# Patient Record
Sex: Male | Born: 1995 | Race: White | Hispanic: No | Marital: Single | Smoking: Current every day smoker
Health system: Southern US, Community
[De-identification: ages and names within clinical notes are randomized; demographics above are authoritative.]

## PROBLEM LIST (undated history)

## (undated) DIAGNOSIS — G8929 Other chronic pain: Secondary | ICD-10-CM

## (undated) DIAGNOSIS — M419 Scoliosis, unspecified: Secondary | ICD-10-CM

## (undated) DIAGNOSIS — F909 Attention-deficit hyperactivity disorder, unspecified type: Secondary | ICD-10-CM

## (undated) DIAGNOSIS — R569 Unspecified convulsions: Secondary | ICD-10-CM

## (undated) DIAGNOSIS — R000 Tachycardia, unspecified: Secondary | ICD-10-CM

## (undated) DIAGNOSIS — G4700 Insomnia, unspecified: Secondary | ICD-10-CM

## (undated) DIAGNOSIS — F4310 Post-traumatic stress disorder, unspecified: Secondary | ICD-10-CM

## (undated) DIAGNOSIS — J449 Chronic obstructive pulmonary disease, unspecified: Secondary | ICD-10-CM

## (undated) DIAGNOSIS — F419 Anxiety disorder, unspecified: Secondary | ICD-10-CM

## (undated) DIAGNOSIS — K089 Disorder of teeth and supporting structures, unspecified: Secondary | ICD-10-CM

## (undated) HISTORY — PX: NOSE SURGERY: SHX723

## (undated) HISTORY — PX: SKIN GRAFT: SHX250

## (undated) HISTORY — PX: CHOANAL ATRESIA REPAIR: SHX280

---
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
## 2014-07-18 ENCOUNTER — Emergency Department (HOSPITAL_COMMUNITY)
Admission: EM | Admit: 2014-07-18 | Discharge: 2014-07-18 | Disposition: A | Discharge status: Home / Self Care | Payer: Medicaid Other | Attending: Emergency Medicine | Admitting: Emergency Medicine

## 2014-07-18 ENCOUNTER — Encounter (HOSPITAL_COMMUNITY): Payer: Self-pay | Admitting: Emergency Medicine

## 2014-07-18 DIAGNOSIS — K002 Abnormalities of size and form of teeth: Secondary | ICD-10-CM | POA: Diagnosis not present

## 2014-07-18 DIAGNOSIS — M419 Scoliosis, unspecified: Secondary | ICD-10-CM | POA: Insufficient documentation

## 2014-07-18 DIAGNOSIS — R000 Tachycardia, unspecified: Secondary | ICD-10-CM | POA: Insufficient documentation

## 2014-07-18 DIAGNOSIS — K088 Other specified disorders of teeth and supporting structures: Secondary | ICD-10-CM | POA: Insufficient documentation

## 2014-07-18 DIAGNOSIS — Z79899 Other long term (current) drug therapy: Secondary | ICD-10-CM | POA: Diagnosis not present

## 2014-07-18 DIAGNOSIS — F419 Anxiety disorder, unspecified: Secondary | ICD-10-CM | POA: Diagnosis not present

## 2014-07-18 DIAGNOSIS — Z720 Tobacco use: Secondary | ICD-10-CM | POA: Diagnosis not present

## 2014-07-18 DIAGNOSIS — R220 Localized swelling, mass and lump, head: Secondary | ICD-10-CM | POA: Diagnosis present

## 2014-07-18 DIAGNOSIS — K0889 Other specified disorders of teeth and supporting structures: Secondary | ICD-10-CM

## 2014-07-18 MED ORDER — HYDROCODONE-ACETAMINOPHEN 5-325 MG PO TABS: 1.0000 | ORAL_TABLET | Freq: Four times a day (QID) | ORAL | Status: DC | PRN

## 2014-07-18 MED ORDER — HYDROMORPHONE HCL 2 MG/ML IJ SOLN
2.0000 mg | Freq: Once | INTRAMUSCULAR | Status: AC
  Administered 2014-07-18: 2 mg via INTRAMUSCULAR
  Filled 2014-07-18: qty 1

## 2014-07-18 NOTE — ED Notes (Signed)
Pt reports left sided dental pain x2 week. Pt reports pain became more intense last night. Mild swelling noted to left cheek. Airway patent. Pt reports saw pcp for same and px abx with minimal relief. Pt reports has appointment with oral surgeon within the next month. nad noted.

## 2014-07-18 NOTE — ED Provider Notes (Signed)
CSN: [id number]     Arrival date & time 07/18/14  7829 History   First MD Initiated Contact with Patient 07/18/14 1918     Chief Complaint  Patient presents with  . Oral Swelling     (Consider location/radiation/quality/duration/timing/severity/associated sxs/prior Treatment) The history is provided by the patient.   19 year old male. Here for complaint of tooth pain left upper tooth. Patient has some swelling to his face on that side. Patient said trouble with teeth before. This meant ongoing for 2 weeks without significant only worse here just last night. Patient has follow-up with oral surgery in the next few weeks. Patient actually has amoxicillin left over from prior toothache treatments. Just prescribed in December still current has at least a week's left. Patient started taking that along with clindamycin that he had left over 2 days ago.  Past Medical History  Diagnosis Date  . Scoliosis   . PTSD (post-traumatic stress disorder)   . Anxiety   . PTSD (post-traumatic stress disorder)   . Anxiety   . Seizures    Past Surgical History  Procedure Laterality Date  . Skin graft    . Nose surgery    . Choanal atresia repair     History reviewed. No pertinent family history. History  Substance Use Topics  . Smoking status: Current Every Day Smoker -- 1.00 packs/day  . Smokeless tobacco: Not on file  . Alcohol Use: No    Review of Systems  Constitutional: Negative for fever.  HENT: Positive for dental problem. Negative for congestion and trouble swallowing.   Eyes: Negative for visual disturbance.  Respiratory: Negative for shortness of breath.   Cardiovascular: Negative for chest pain.  Gastrointestinal: Negative for nausea, vomiting and abdominal pain.  Musculoskeletal: Negative for neck pain.  Skin: Negative for rash.  Neurological: Negative for headaches.  Hematological: Does not bruise/bleed easily.  Psychiatric/Behavioral: Negative for confusion.      Allergies   Review of patient's allergies indicates not on file.  Home Medications   Prior to Admission medications   Medication Sig Start Date End Date Taking? Authorizing Provider  albuterol (PROVENTIL HFA;VENTOLIN HFA) 108 (90 BASE) MCG/ACT inhaler Inhale 2 puffs into the lungs every 6 (six) hours as needed for wheezing or shortness of breath.   Yes Historical Provider, MD  ALPRAZolam Prudy Feeler) 1 MG tablet Take 0.5 mg by mouth 2 (two) times daily.   Yes Historical Provider, MD  amoxicillin (AMOXIL) 875 MG tablet Take 875 mg by mouth 2 (two) times daily.   Yes Historical Provider, MD  naproxen (NAPROSYN) 250 MG tablet Take 250 mg by mouth 2 (two) times daily as needed for mild pain.   Yes Historical Provider, MD  alprazolam Prudy Feeler) 2 MG tablet Take 2 mg by mouth 2 (two) times daily.     Historical Provider, MD  HYDROcodone-acetaminophen (NORCO/VICODIN) 5-325 MG per tablet Take 1-2 tablets by mouth every 6 (six) hours as needed. 07/18/14   Vanetta Mulders, MD   BP 123/71 mmHg  Pulse 130  Temp(Src) 98.2 F (36.8 C) (Oral)  Resp 18  Ht 6' (1.829 m)  Wt 150 lb (68.04 kg)  BMI 20.34 kg/m2  SpO2 100% Physical Exam  Constitutional: He is oriented to person, place, and time. He appears well-developed and well-nourished. No distress.  HENT:  Head: Normocephalic and atraumatic.  Mouth/Throat: Oropharynx is clear and moist.  Very poor dentition mild swelling to the left cheek area. No purulent discharge tenderness to the molars with marked cavities left  upper side. No swelling to the floor the mouth. No uvula shift.  Eyes: Conjunctivae and EOM are normal. Pupils are equal, round, and reactive to light.  Neck: Normal range of motion.  Cardiovascular: Regular rhythm and normal heart sounds.   No murmur heard. Tachycardic  Pulmonary/Chest: Effort normal and breath sounds normal. No respiratory distress.  Abdominal: Soft. There is no tenderness.  Musculoskeletal: Normal range of motion. He exhibits no  edema.  Neurological: He is alert and oriented to person, place, and time. No cranial nerve deficit. He exhibits normal muscle tone. Coordination normal.  Skin: Skin is warm. No rash noted.  Nursing note and vitals reviewed.   ED Course  Procedures (including critical care time) Labs Review Labs Reviewed - No data to display  Imaging Review No results found.   EKG Interpretation None      MDM   Final diagnoses:  Toothache    Patient denies extensive drug use but very poor dentition. Patient was some mild swelling to the left cheek area tenderness to the left upper molars. Patient already has antibiotic due to the pain situation in the past amoxicillin that was just prescribed in December. Has another 7 days of that in the bottle. We'll have him take that. Patient also has Naprosyn which she'll take for inflammation. And supplemented here with some hydrocodone for pain. According to patient's father is follow-up with dental or oral surgery in the next week. Patient here with persistent sinus tachycardia. Has a history of anxiety. Patient without any instability or problems from it did improve some with pain medicine provided here. Patient supposedly is been on Xanax in the past but spin out of it now for a week or longer. Patient does not appear overly anxious. They did not ask but Xanax prescription will not be provided.    Vanetta MuldersScott Berkeley Vanaken, MD 07/18/14 2107

## 2014-07-18 NOTE — ED Notes (Addendum)
Alert, NAD, temp rechecked, pt states he's only had 2 cups of pepsi today, also has been out of his 2mg  xanax for a week (took last one 1/24). Refusing IV at this time. Will wait for physician eval for further intervention

## 2014-07-18 NOTE — Discharge Instructions (Signed)
Dental Pain Toothache is pain in or around a tooth. It may get worse with chewing or with cold or heat.  HOME CARE  Your dentist may use a numbing medicine during treatment. If so, you may need to avoid eating until the medicine wears off. Ask your dentist about this.  Only take medicine as told by your dentist or doctor.  Avoid chewing food near the painful tooth until after all treatment is done. Ask your dentist about this. GET HELP RIGHT AWAY IF:   The problem gets worse or new problems appear.  You have a fever.  There is redness and puffiness (swelling) of the face, jaw, or neck.  You cannot open your mouth.  There is pain in the jaw.  There is very bad pain that is not helped by medicine. MAKE SURE YOU:   Understand these instructions.  Will watch your condition.  Will get help right away if you are not doing well or get worse. Document Released: 11/22/2007 Document Revised: 08/28/2011 Document Reviewed: 11/22/2007 [facility] Patient Information 2015 [employer]erraExitCare, MarylandLLC. This information is not intended to replace advice given to you by your health care provider. Make sure you discuss any questions you have with your health care provider.  Follow-up with a dentist or oral surgery as scheduled. As we discussed start taking the amoxicillin antibiotic that you have twice a day take it for 7 days. Also take the Naprosyn that you have as an anti-inflammatory medicine. Supplement with pain medicine as needed. Return for any new or worse symptoms.

## 2014-08-04 ENCOUNTER — Emergency Department (HOSPITAL_COMMUNITY)
Admission: EM | Admit: 2014-08-04 | Discharge: 2014-08-04 | Disposition: A | Discharge status: Home / Self Care | Payer: Medicaid Other | Attending: Emergency Medicine | Admitting: Emergency Medicine

## 2014-08-04 ENCOUNTER — Encounter (HOSPITAL_COMMUNITY): Payer: Self-pay | Admitting: Emergency Medicine

## 2014-08-04 DIAGNOSIS — K006 Disturbances in tooth eruption: Secondary | ICD-10-CM | POA: Insufficient documentation

## 2014-08-04 DIAGNOSIS — F4310 Post-traumatic stress disorder, unspecified: Secondary | ICD-10-CM | POA: Diagnosis not present

## 2014-08-04 DIAGNOSIS — K029 Dental caries, unspecified: Secondary | ICD-10-CM | POA: Diagnosis not present

## 2014-08-04 DIAGNOSIS — Z8669 Personal history of other diseases of the nervous system and sense organs: Secondary | ICD-10-CM | POA: Insufficient documentation

## 2014-08-04 DIAGNOSIS — G8929 Other chronic pain: Secondary | ICD-10-CM | POA: Insufficient documentation

## 2014-08-04 DIAGNOSIS — R51 Headache: Secondary | ICD-10-CM | POA: Diagnosis not present

## 2014-08-04 DIAGNOSIS — K0381 Cracked tooth: Secondary | ICD-10-CM | POA: Insufficient documentation

## 2014-08-04 DIAGNOSIS — Z79899 Other long term (current) drug therapy: Secondary | ICD-10-CM | POA: Diagnosis not present

## 2014-08-04 DIAGNOSIS — Z792 Long term (current) use of antibiotics: Secondary | ICD-10-CM | POA: Diagnosis not present

## 2014-08-04 DIAGNOSIS — Z720 Tobacco use: Secondary | ICD-10-CM | POA: Insufficient documentation

## 2014-08-04 DIAGNOSIS — Z791 Long term (current) use of non-steroidal anti-inflammatories (NSAID): Secondary | ICD-10-CM | POA: Insufficient documentation

## 2014-08-04 DIAGNOSIS — F419 Anxiety disorder, unspecified: Secondary | ICD-10-CM | POA: Diagnosis not present

## 2014-08-04 DIAGNOSIS — Z8739 Personal history of other diseases of the musculoskeletal system and connective tissue: Secondary | ICD-10-CM | POA: Diagnosis not present

## 2014-08-04 DIAGNOSIS — K088 Other specified disorders of teeth and supporting structures: Secondary | ICD-10-CM | POA: Diagnosis present

## 2014-08-04 MED ORDER — NAPROXEN 500 MG PO TABS: 500.0000 mg | ORAL_TABLET | Freq: Two times a day (BID) | ORAL | Status: DC

## 2014-08-04 MED ORDER — AMOXICILLIN 500 MG PO CAPS: 500.0000 mg | ORAL_CAPSULE | Freq: Three times a day (TID) | ORAL | Status: DC

## 2014-08-04 MED ORDER — HYDROCODONE-ACETAMINOPHEN 5-325 MG PO TABS: 1.0000 | ORAL_TABLET | ORAL | Status: DC | PRN

## 2014-08-04 NOTE — ED Provider Notes (Signed)
CSN: [id number]     Arrival date & time 08/04/14  1627 History   First MD Initiated Contact with Patient 08/04/14 1834     Chief Complaint  Patient presents with  . Dental Pain     (Consider location/radiation/quality/duration/timing/severity/associated sxs/prior Treatment) Patient is a 19 y.o. male presenting with tooth pain. The history is provided by the patient.  Dental Pain Location:  Generalized Quality:  Throbbing Severity:  Severe Onset quality:  Gradual Duration: acute on chronic issue. Timing:  Intermittent Progression:  Worsening Chronicity:  Chronic Context: dental caries, dental fracture and poor dentition   Relieved by:  Nothing Worsened by:  Cold food/drink Ineffective treatments:  None tried Associated symptoms: facial pain, gum swelling and headaches   Associated symptoms: no drooling, no fever, no neck pain and no trismus   Risk factors: lack of dental care and smoking   Risk factors: no diabetes     Past Medical History  Diagnosis Date  . Scoliosis   . PTSD (post-traumatic stress disorder)   . Anxiety   . PTSD (post-traumatic stress disorder)   . Anxiety   . Seizures    Past Surgical History  Procedure Laterality Date  . Skin graft    . Nose surgery    . Choanal atresia repair     History reviewed. No pertinent family history. History  Substance Use Topics  . Smoking status: Current Every Day Smoker -- 1.00 packs/day  . Smokeless tobacco: Not on file  . Alcohol Use: No    Review of Systems  Constitutional: Negative for fever and activity change.       All ROS Neg except as noted in HPI  HENT: Negative for drooling.   Eyes: Negative for photophobia and discharge.  Respiratory: Negative for cough, shortness of breath and wheezing.   Cardiovascular: Negative for chest pain and palpitations.  Gastrointestinal: Negative for abdominal pain and blood in stool.  Genitourinary: Negative for dysuria, frequency and hematuria.  Musculoskeletal:  Negative for back pain, arthralgias and neck pain.  Skin: Negative.   Neurological: Positive for headaches. Negative for dizziness, seizures and speech difficulty.  Psychiatric/Behavioral: Negative for hallucinations and confusion.      Allergies  Review of patient's allergies indicates no known allergies.  Home Medications   Prior to Admission medications   Medication Sig Start Date End Date Taking? Authorizing Provider  albuterol (PROVENTIL HFA;VENTOLIN HFA) 108 (90 BASE) MCG/ACT inhaler Inhale 2 puffs into the lungs every 6 (six) hours as needed for wheezing or shortness of breath.    Historical Provider, MD  ALPRAZolam Prudy Feeler) 1 MG tablet Take 0.5 mg by mouth 2 (two) times daily.    Historical Provider, MD  alprazolam Prudy Feeler) 2 MG tablet Take 2 mg by mouth 2 (two) times daily.     Historical Provider, MD  amoxicillin (AMOXIL) 875 MG tablet Take 875 mg by mouth 2 (two) times daily.    Historical Provider, MD  HYDROcodone-acetaminophen (NORCO/VICODIN) 5-325 MG per tablet Take 1-2 tablets by mouth every 6 (six) hours as needed. 07/18/14   Vanetta Mulders, MD  naproxen (NAPROSYN) 250 MG tablet Take 250 mg by mouth 2 (two) times daily as needed for mild pain.    Historical Provider, MD   BP 130/64 mmHg  Pulse 105  Temp(Src) 98.6 F (37 C) (Oral)  Resp 18  Ht 6' (1.829 m)  Wt 150 lb (68.04 kg)  BMI 20.34 kg/m2  SpO2 100% Physical Exam  Constitutional: He is oriented to person, place,  and time. He appears well-developed and well-nourished.  Non-toxic appearance.  HENT:  Head: Normocephalic.  Right Ear: Tympanic membrane and external ear normal.  Left Ear: Tympanic membrane and external ear normal.  Multiple dental caries the upper and lower jaw. The airway is patent. There is no swelling under the tongue.  Eyes: EOM and lids are normal. Pupils are equal, round, and reactive to light.  Neck: Normal range of motion. Neck supple. Carotid bruit is not present.  No palpable cervical  lymph nodes appreciated.  Cardiovascular: Normal rate, regular rhythm, normal heart sounds, intact distal pulses and normal pulses.   No murmur heard. Pulmonary/Chest: Breath sounds normal. No respiratory distress.  Abdominal: Soft. Bowel sounds are normal. There is no tenderness. There is no guarding.  Musculoskeletal: Normal range of motion.  Lymphadenopathy:       Head (right side): No submandibular adenopathy present.       Head (left side): No submandibular adenopathy present.    He has no cervical adenopathy.  Neurological: He is alert and oriented to person, place, and time. He has normal strength. No cranial nerve deficit or sensory deficit.  Skin: Skin is warm and dry.  Psychiatric: He has a normal mood and affect. His speech is normal.  Nursing note and vitals reviewed.   ED Course  Procedures (including critical care time) Labs Review Labs Reviewed - No data to display  Imaging Review No results found.   EKG Interpretation None      MDM  Patient has multiple severe dental caries present. No visible abscess appreciated. No trismus noted. No evidence for Ludwig's angina.  Patient states that he is being seen by a dentist Dr. Effie ShyWentz in for genuine. He is waiting for call for the appointment time.  Prescription for Norco, Amoxil, and naproxen to the patient.    Final diagnoses:  Complex dental caries    *I have reviewed nursing notes, vital signs, and all appropriate lab and imaging results for this patient.**    Kathie DikeHobson M Kaleigha Chamberlin, PA-C 08/04/14 1909  Samuel JesterKathleen McManus, DO 08/06/14 [phone]

## 2014-08-04 NOTE — Discharge Instructions (Signed)
Dental Caries  Dental caries (also called tooth decay) is the most common oral disease. It can occur at any age but is more common in children and young adults.   HOW DENTAL CARIES DEVELOPS   The process of decay begins when bacteria and foods (particularly sugars and starches) combine in your mouth to produce plaque. Plaque is a substance that sticks to the hard, outer surface of a tooth (enamel). The bacteria in plaque produce acids that attack enamel. These acids may also attack the root surface of a tooth (cementum) if it is exposed. Repeated attacks dissolve these surfaces and create holes in the tooth (cavities). If left untreated, the acids destroy the other layers of the tooth.   RISK FACTORS   Frequent sipping of sugary beverages.    Frequent snacking on sugary and starchy foods, especially those that easily get stuck in the teeth.    Poor oral hygiene.    Dry mouth.    Substance abuse such as methamphetamine abuse.    Broken or poor-fitting dental restorations.    Eating disorders.    Gastroesophageal reflux disease (GERD).    Certain radiation treatments to the head and neck.  SYMPTOMS  In the early stages of dental caries, symptoms are seldom present. Sometimes white, chalky areas may be seen on the enamel or other tooth layers. In later stages, symptoms may include:   Pits and holes on the enamel.   Toothache after sweet, hot, or cold foods or drinks are consumed.   Pain around the tooth.   Swelling around the tooth.  DIAGNOSIS   Most of the time, dental caries is detected during a regular dental checkup. A diagnosis is made after a thorough medical and dental history is taken and the surfaces of your teeth are checked for signs of dental caries. Sometimes special instruments, such as lasers, are used to check for dental caries. Dental X-ray exams may be taken so that areas not visible to the eye (such as between the contact areas of the teeth) can be checked for cavities.    TREATMENT   If dental caries is in its early stages, it may be reversed with a fluoride treatment or an application of a remineralizing agent at the dental office. Thorough brushing and flossing at home is needed to aid these treatments. If it is in its later stages, treatment depends on the location and extent of tooth destruction:    If a small area of the tooth has been destroyed, the destroyed area will be removed and cavities will be filled with a material such as gold, silver amalgam, or composite resin.    If a large area of the tooth has been destroyed, the destroyed area will be removed and a cap (crown) will be fitted over the remaining tooth structure.    If the center part of the tooth (pulp) is affected, a procedure called a root canal will be needed before a filling or crown can be placed.    If most of the tooth has been destroyed, the tooth may need to be pulled (extracted).  HOME CARE INSTRUCTIONS  You can prevent, stop, or reverse dental caries at home by practicing good oral hygiene. Good oral hygiene includes:   Thoroughly cleaning your teeth at least twice a day with a toothbrush and dental floss.    Using a fluoride toothpaste. A fluoride mouth rinse may also be used if recommended by your dentist or health care provider.    Restricting   the amount of sugary and starchy foods and sugary liquids you consume.    Avoiding frequent snacking on these foods and sipping of these liquids.    Keeping regular visits with a dentist for checkups and cleanings.  PREVENTION    Practice good oral hygiene.   Consider a dental sealant. A dental sealant is a coating material that is applied by your dentist to the pits and grooves of teeth. The sealant prevents food from being trapped in them. It may protect the teeth for several years.   Ask about fluoride supplements if you live in a community without fluorinated water or with water that has a low fluoride content. Use fluoride supplements  as directed by your dentist or health care provider.   Allow fluoride varnish applications to teeth if directed by your dentist or health care provider.  Document Released: 02/25/2002 Document Revised: 10/20/2013 Document Reviewed: 06/07/2012  ExitCare Patient Information 2015 [employer]. This information is not intended to replace advice given to you by your health care provider. Make sure you discuss any questions you have with your health care provider.

## 2014-08-04 NOTE — ED Notes (Signed)
Pt states that he has been having dental pain for the past few days.  Saw a dentist a few weeks ago who referred to oral surgeon.  Awaiting appt.

## 2014-08-13 ENCOUNTER — Encounter (HOSPITAL_COMMUNITY): Payer: Self-pay | Admitting: Emergency Medicine

## 2014-08-13 ENCOUNTER — Emergency Department (HOSPITAL_COMMUNITY)
Admission: EM | Admit: 2014-08-13 | Discharge: 2014-08-13 | Disposition: A | Discharge status: Home / Self Care | Payer: Medicaid Other | Attending: Emergency Medicine | Admitting: Emergency Medicine

## 2014-08-13 DIAGNOSIS — K047 Periapical abscess without sinus: Secondary | ICD-10-CM

## 2014-08-13 DIAGNOSIS — Z791 Long term (current) use of non-steroidal anti-inflammatories (NSAID): Secondary | ICD-10-CM | POA: Diagnosis not present

## 2014-08-13 DIAGNOSIS — Z8739 Personal history of other diseases of the musculoskeletal system and connective tissue: Secondary | ICD-10-CM | POA: Diagnosis not present

## 2014-08-13 DIAGNOSIS — K029 Dental caries, unspecified: Secondary | ICD-10-CM

## 2014-08-13 DIAGNOSIS — K1379 Other lesions of oral mucosa: Secondary | ICD-10-CM | POA: Diagnosis present

## 2014-08-13 DIAGNOSIS — F4310 Post-traumatic stress disorder, unspecified: Secondary | ICD-10-CM | POA: Insufficient documentation

## 2014-08-13 DIAGNOSIS — Z720 Tobacco use: Secondary | ICD-10-CM | POA: Diagnosis not present

## 2014-08-13 DIAGNOSIS — Z79899 Other long term (current) drug therapy: Secondary | ICD-10-CM | POA: Diagnosis not present

## 2014-08-13 DIAGNOSIS — R000 Tachycardia, unspecified: Secondary | ICD-10-CM | POA: Diagnosis not present

## 2014-08-13 MED ORDER — AMOXICILLIN 500 MG PO CAPS: 500.0000 mg | ORAL_CAPSULE | Freq: Three times a day (TID) | ORAL | Status: AC

## 2014-08-13 MED ORDER — HYDROCODONE-ACETAMINOPHEN 5-325 MG PO TABS: 1.0000 | ORAL_TABLET | ORAL | Status: DC | PRN

## 2014-08-13 NOTE — ED Notes (Signed)
Pt has piece of teeth breaking off on upper teeth, causing pain, Pt is on antibiotic and has surgery scheduled

## 2014-08-13 NOTE — Discharge Instructions (Signed)

## 2014-08-13 NOTE — ED Provider Notes (Signed)
CSN: [id number]     Arrival date & time 08/13/14  1521 History   First MD Initiated Contact with Patient 08/13/14 1613     No chief complaint on file.    (Consider location/radiation/quality/duration/timing/severity/associated sxs/prior Treatment) The history is provided by the patient.   Vincent Green is a 19 y.o. male with a history of PTSD and chronic anxiety presenting with persistent mouth pain and gingival swelling since his last visit here 9 days ago.  He has advanced dental decay of his entire upper dentition and is scheduled for complete upper extractions in anticipation of denture placement by Dr. Effie ShyWentz in [place] on March 2.  He has been unable to sleep the past several nights secondary to pain.  He is completing his last dose of amoxil today from his last visit and has lesser gingival swelling but not completely resolved.  He denies fevers or chills, headache, nausea or vomiting.  He does endorse increased anxiety secondary to pain and lack of sleep.    Past Medical History  Diagnosis Date  . Scoliosis   . PTSD (post-traumatic stress disorder)   . Anxiety   . PTSD (post-traumatic stress disorder)   . Anxiety   . Seizures    Past Surgical History  Procedure Laterality Date  . Skin graft    . Nose surgery    . Choanal atresia repair     No family history on file. History  Substance Use Topics  . Smoking status: Current Every Day Smoker -- 1.00 packs/day  . Smokeless tobacco: Not on file  . Alcohol Use: No    Review of Systems  Constitutional: Negative for fever.  HENT: Positive for dental problem. Negative for facial swelling and sore throat.   Respiratory: Negative for shortness of breath.   Musculoskeletal: Negative for neck pain and neck stiffness.  Psychiatric/Behavioral: The patient is nervous/anxious.       Allergies  Review of patient's allergies indicates no known allergies.  Home Medications   Prior to Admission medications   Medication Sig  Start Date End Date Taking? Authorizing Provider  albuterol (PROVENTIL HFA;VENTOLIN HFA) 108 (90 BASE) MCG/ACT inhaler Inhale 2 puffs into the lungs every 6 (six) hours as needed for wheezing or shortness of breath.    Historical Provider, MD  ALPRAZolam Prudy Feeler(XANAX) 1 MG tablet Take 0.5 mg by mouth 2 (two) times daily.    Historical Provider, MD  alprazolam Prudy Feeler(XANAX) 2 MG tablet Take 2 mg by mouth 2 (two) times daily.     Historical Provider, MD  amoxicillin (AMOXIL) 500 MG capsule Take 1 capsule (500 mg total) by mouth 3 (three) times daily. 08/13/14 08/23/14  Burgess AmorJulie Ezell Poke, PA-C  HYDROcodone-acetaminophen (NORCO/VICODIN) 5-325 MG per tablet Take 1 tablet by mouth every 4 (four) hours as needed. 08/13/14   Burgess AmorJulie Araeya Lamb, PA-C  naproxen (NAPROSYN) 500 MG tablet Take 1 tablet (500 mg total) by mouth 2 (two) times daily. 08/04/14   Kathie DikeHobson M Bryant, PA-C   BP 141/76 mmHg  Pulse 136  Temp(Src) 98.4 F (36.9 C) (Oral)  Resp 14  Ht 6' (1.829 m)  Wt 145 lb (65.772 kg)  BMI 19.66 kg/m2  SpO2 100% Physical Exam  Constitutional: He is oriented to person, place, and time. He appears well-developed and well-nourished. No distress.  HENT:  Head: Normocephalic and atraumatic.  Right Ear: Tympanic membrane and external ear normal.  Left Ear: Tympanic membrane and external ear normal.  Mouth/Throat: Oropharynx is clear and moist and mucous membranes are normal.  No oral lesions. No trismus in the jaw. Dental abscesses present.  Poor dentition, enamel all upper teeth eroded at gumline, lesser extent on lower teeth.  Gingival recession,  Edema without fluctuance left upper gingiva.  No sublingual induration. No facial edema or erythema.  Left submandibular node tender.  Eyes: Conjunctivae are normal.  Neck: Normal range of motion. Neck supple.  Cardiovascular: Regular rhythm and normal heart sounds.  Tachycardia present.   Pulmonary/Chest: Effort normal and breath sounds normal.  Lymphadenopathy:    He has no cervical  adenopathy.  Neurological: He is alert and oriented to person, place, and time.  Skin: Skin is warm and dry. No erythema.  Psychiatric: He has a normal mood and affect.  Nursing note and vitals reviewed.   ED Course  Procedures (including critical care time) Labs Review Labs Reviewed - No data to display  Imaging Review No results found.   EKG Interpretation None      MDM   Final diagnoses:  Dental decay  Dental abscess    Pt still with signs of gingival infection, will extend amoxil prescription.  Hydrocodone prescribed. Prior ed visits reviewed, all with moderate tachycardia.  Suspect due to anxiety. Pulse check during exam 112.  No complaint of cp, palpitations, he does appear anxious.  Plan f/u with oral surgeon as he has arranged.  Father presents with patient who endorses pt hx.      Burgess Amor, PA-C 08/14/14 1407  Raeford Razor, MD 08/19/14 [phone]

## 2014-10-04 ENCOUNTER — Emergency Department (HOSPITAL_COMMUNITY)
Admission: EM | Admit: 2014-10-04 | Discharge: 2014-10-04 | Disposition: A | Discharge status: Home / Self Care | Payer: Medicaid Other | Attending: Emergency Medicine | Admitting: Emergency Medicine

## 2014-10-04 ENCOUNTER — Encounter (HOSPITAL_COMMUNITY): Payer: Self-pay | Admitting: Emergency Medicine

## 2014-10-04 DIAGNOSIS — G8929 Other chronic pain: Secondary | ICD-10-CM | POA: Diagnosis not present

## 2014-10-04 DIAGNOSIS — Z720 Tobacco use: Secondary | ICD-10-CM | POA: Insufficient documentation

## 2014-10-04 DIAGNOSIS — Z8739 Personal history of other diseases of the musculoskeletal system and connective tissue: Secondary | ICD-10-CM | POA: Diagnosis not present

## 2014-10-04 DIAGNOSIS — K088 Other specified disorders of teeth and supporting structures: Secondary | ICD-10-CM | POA: Diagnosis present

## 2014-10-04 DIAGNOSIS — Z791 Long term (current) use of non-steroidal anti-inflammatories (NSAID): Secondary | ICD-10-CM | POA: Insufficient documentation

## 2014-10-04 DIAGNOSIS — F419 Anxiety disorder, unspecified: Secondary | ICD-10-CM | POA: Diagnosis not present

## 2014-10-04 DIAGNOSIS — Z79899 Other long term (current) drug therapy: Secondary | ICD-10-CM | POA: Diagnosis not present

## 2014-10-04 DIAGNOSIS — K029 Dental caries, unspecified: Secondary | ICD-10-CM

## 2014-10-04 HISTORY — DX: Other chronic pain: G89.29

## 2014-10-04 HISTORY — DX: Disorder of teeth and supporting structures, unspecified: K08.9

## 2014-10-04 MED ORDER — IBUPROFEN 800 MG PO TABS: 800.0000 mg | ORAL_TABLET | Freq: Three times a day (TID) | ORAL | Status: DC

## 2014-10-04 MED ORDER — PENICILLIN V POTASSIUM 500 MG PO TABS: 500.0000 mg | ORAL_TABLET | Freq: Three times a day (TID) | ORAL | Status: DC

## 2014-10-04 NOTE — Discharge Instructions (Signed)
Dental Care and Dentist Visits Dental care supports good overall health. Regular dental visits can also help you avoid dental pain, bleeding, infection, and other more serious health problems in the future. It is important to keep the mouth healthy because diseases in the teeth, gums, and other oral tissues can spread to other areas of the body. Some problems, such as diabetes, heart disease, and pre-term labor have been associated with poor oral health.  See your dentist every 6 months. If you experience emergency problems such as a toothache or broken tooth, go to the dentist right away. If you see your dentist regularly, you may catch problems early. It is easier to be treated for problems in the early stages.  WHAT TO EXPECT AT A DENTIST VISIT  Your dentist will look for many common oral health problems and recommend proper treatment. At your regular dental visit, you can expect:  Gentle cleaning of the teeth and gums. This includes scraping and polishing. This helps to remove the sticky substance around the teeth and gums (plaque). Plaque forms in the mouth shortly after eating. Over time, plaque hardens on the teeth as tartar. If tartar is not removed regularly, it can cause problems. Cleaning also helps remove stains.  Periodic X-rays. These pictures of the teeth and supporting bone will help your dentist assess the health of your teeth.  Periodic fluoride treatments. Fluoride is a natural mineral shown to help strengthen teeth. Fluoride treatmentinvolves applying a fluoride gel or varnish to the teeth. It is most commonly done in children.  Examination of the mouth, tongue, jaws, teeth, and gums to look for any oral health problems, such as:  Cavities (dental caries). This is decay on the tooth caused by plaque, sugar, and acid in the mouth. It is best to catch a cavity when it is small.  Inflammation of the gums caused by plaque buildup (gingivitis).  Problems with the mouth or malformed  or misaligned teeth.  Oral cancer or other diseases of the soft tissues or jaws. KEEP YOUR TEETH AND GUMS HEALTHY For healthy teeth and gums, follow these general guidelines as well as your dentist's specific advice:  Have your teeth professionally cleaned at the dentist every 6 months.  Brush twice daily with a fluoride toothpaste.  Floss your teeth daily.  Ask your dentist if you need fluoride supplements, treatments, or fluoride toothpaste.  Eat a healthy diet. Reduce foods and drinks with added sugar.  Avoid smoking. TREATMENT FOR ORAL HEALTH PROBLEMS If you have oral health problems, treatment varies depending on the conditions present in your teeth and gums.  Your caregiver will most likely recommend good oral hygiene at each visit.  For cavities, gingivitis, or other oral health disease, your caregiver will perform a procedure to treat the problem. This is typically done at a separate appointment. Sometimes your caregiver will refer you to another dental specialist for specific tooth problems or for surgery. SEEK IMMEDIATE DENTAL CARE IF:  You have pain, bleeding, or soreness in the gum, tooth, jaw, or mouth area.  A permanent tooth becomes loose or separated from the gum socket.  You experience a blow or injury to the mouth or jaw area. Document Released: 02/15/2011 Document Revised: 08/28/2011 Document Reviewed: 02/15/2011 [facility] Patient Information 2015 [employer] [place], [place]. This information is not intended to replace advice given to you by your health care provider. Make sure you discuss any questions you have with your health care provider.  Dental Caries Dental caries is tooth decay. This  decay can cause a hole in teeth (cavity) that can get bigger and deeper over time. HOME CARE  Brush and floss your teeth. Do this at least two times a day.  Use a fluoride toothpaste.  Use a mouth rinse if told by your dentist or doctor.  Eat less sugary and starchy foods.  Drink less sugary drinks.  Avoid snacking often on sugary and starchy foods. Avoid sipping often on sugary drinks.  Keep regular checkups and cleanings with your dentist.  Use fluoride supplements if told by your dentist or doctor.  Allow fluoride to be applied to teeth if told by your dentist or doctor. Document Released: 03/14/2008 Document Revised: 10/20/2013 Document Reviewed: 06/07/2012 [facility] Patient Information 2015 [place], [place]. This information is not intended to replace advice given to you by your health care provider. Make sure you discuss any questions you have with your health care provider.  Dental Pain A tooth ache may be caused by cavities (tooth decay). Cavities expose the nerve of the tooth to air and hot or cold temperatures. It may come from an infection or abscess (also called a boil or furuncle) around your tooth. It is also often caused by dental caries (tooth decay). This causes the pain you are having. DIAGNOSIS  Your caregiver can diagnose this problem by exam. TREATMENT   If caused by an infection, it may be treated with medications which kill germs (antibiotics) and pain medications as prescribed by your caregiver. Take medications as directed.  Only take over-the-counter or prescription medicines for pain, discomfort, or fever as directed by your caregiver.  Whether the tooth ache today is caused by infection or dental disease, you should see your dentist as soon as possible for further care. SEEK MEDICAL CARE IF: The exam and treatment you received today has been provided on an emergency basis only. This is not a substitute for complete medical or dental care. If your problem worsens or new problems (symptoms) appear, and you are unable to meet with your dentist, call or return to this location. SEEK IMMEDIATE MEDICAL CARE IF:   You have a fever.  You develop redness and swelling of your face, jaw, or neck.  You are unable to open your mouth.  You  have severe pain uncontrolled by pain medicine. MAKE SURE YOU:   Understand these instructions.  Will watch your condition.  Will get help right away if you are not doing well or get worse. Document Released: 06/05/2005 Document Revised: 08/28/2011 Document Reviewed: 01/22/2008 [facility] Patient Information 2015 [place], [place]. This information is not intended to replace advice given to you by your health care provider. Make sure you discuss any questions you have with your health care provider.  Emergency Department Resource Guide 1) Find a Doctor and Pay Out of Pocket Although you won't have to find out who is covered by your insurance plan, it is a good idea to ask around and get recommendations. You will then need to call the office and see if the doctor you have chosen will accept you as a new patient and what types of options they offer for patients who are self-pay. Some doctors offer discounts or will set up payment plans for their patients who do not have insurance, but you will need to ask so you aren't surprised when you get to your appointment.  2) Contact Your Local Health Department Not all health departments have doctors that can see patients for sick visits, but many do, so it is worth  a call to see if yours does. If you don't know where your local health department is, you can check in your phone book. The CDC also has a tool to help you locate your state's health department, and many state websites also have listings of all of their local health departments.  3) Find a Walk-in Clinic If your illness is not likely to be very severe or complicated, you may want to try a walk in clinic. These are popping up all over the country in pharmacies, drugstores, and shopping centers. They're usually staffed by nurse practitioners or physician assistants that have been trained to treat common illnesses and complaints. They're usually fairly quick and inexpensive. However, if you have serious  medical issues or chronic medical problems, these are probably not your best option.  No Primary Care Doctor: - Call Health Connect at  [phone] - they can help you locate a primary care doctor that  accepts your insurance, provides certain services, etc. - Physician Referral Service- [phone]  Chronic Pain Problems: Organization         Address  Phone   Notes  [facility]  [phone] Patients need to be referred by their primary care doctor.   Medication Assistance: Organization         Address  Phone   Notes  [facility] Medication [place] [facility] [address] [place], [place] [address] [phone]) [phone] --Must be a resident of [facility] -- Must have NO insurance coverage whatsoever (no Medicaid/ Medicare, etc.) -- The pt. MUST have a primary care doctor that directs their care regularly and follows them in the community   MedAssist  [phone]   [employer]  [phone]    Agencies that provide inexpensive medical care: Organization         Address  Phone   Notes  [facility]  [phone]   [facility]    [phone]   [facility] [address] [place], [place] [address] [phone]   [facility] [address], [place] [phone]   [facility]    [phone]   [facility]    [phone]   [facility]  [address], [place] Phone:  [phone], Fax:  [phone] Hours of Operation:  9 am - 6 pm, M-F.  Also accepts Medicaid/Medicare and self-pay.  [facility]  [address], [place] Phone: [phone], Fax: [phone]. Hours of Operation:  8:30 am - 5:30 pm, M-F.  Also accepts Medicaid and self-pay.  [facility] [place] [address], [place] Phone: [phone]   [facility] [address], [place], [place] [phone], Ext. [phone] Mondays & Thursdays: 7-9 AM.  First 15 patients are seen on a first come, first serve basis.    Medicaid-accepting [facility] Providers:  Organization         Address  Phone   Notes  [facility] [address], [place] [phone] Also accepts self-pay patients.  [facility] [address], [place]  [phone]   [facility] [address], [place] [phone]   [facility] [address] [place] [address], [place] [phone]   Renaye RakersVeita Bland [address], [address], [place]   [phone] Only accepts [place] Access Medicaid patients after they have their name applied to their card.   Self-Pay (no insurance) in [facility] [place]:  Organization         Address  Phone   Notes  Sickle Cell Patients, [facility] [address], [place] [phone]   [facility] [address], [place] [phone]   [facility] [place]  [address], [place] [phone]   [facility]/Dr. Osei-Bonsu  [address] [place] [address], [place] or [id number], [place] [phone] Phone number for both [place] and [place] locations is the same.  [facility] [address], [place] [phone]   [facility] [place] [address] [place] [address], [place] or [address] [phone]   [facility] [address], [place] [phone], phone; [phone], fax Sees patients 1st and 3rd Saturday of every month.  Must not qualify for public or private insurance (i.e. Medicaid, Medicare, [place] Health Choice, Veterans' Benefits)  Household income should be no more than 200% of the poverty level The clinic cannot treat you if you are pregnant or think you are pregnant  Sexually  transmitted diseases are not treated at the clinic.    Dental Care: Organization         Address  Phone  Notes  [facility] of [facility] [address], [place] [phone] Accepts children up to age 20 who are enrolled in [place] or [place] Health Choice; pregnant women with a Medicaid card; and children who have applied for Medicaid or [place] Health Choice, but were declined, whose parents can pay a reduced fee at time of service.  [facility] Department of [facility] [place]  [address], [place] [phone] Accepts children up to age 20 who are enrolled in [place] or [place] Health Choice; pregnant women with a Medicaid card; and children who have applied for Medicaid or  Health Choice, but were declined, whose parents can pay a reduced fee at time of service.  [facility]  [address], [place] [phone] Patients are seen by appointment only. Walk-ins are not accepted. [facility] will see patients 20 years of age and older. Monday - Tuesday (8am-5pm) Most Wednesdays (8:30-5pm) $30 per visit, cash only  [facility]  [address], [facility] [phone] Patients are seen by appointment only. Walk-ins are not accepted. [facility] will see patients 20 years of age and older. One Wednesday Evening (Monthly: Volunteer Based).  $30 per visit, cash only  [facility]  [phone] for adults; Children under age 324, call Graduate Pediatric Dentistry at [phone]. Children aged 514-14, please call [phone] to request a pediatric application.  Dental services are provided in all areas of dental care including fillings, crowns and bridges, complete and partial dentures, implants, gum treatment, root canals, and extractions. Preventive care is also provided. Treatment is provided to both adults and children. Patients are  selected via a lottery and there is often a waiting list.   [facility] [address], [place]  [phone] [email or web address]   [facility] [address], [place], [place] [phone], Ext. [phone] Second and Fourth Thursday of each  month, opens at 6:30 AM; Clinic ends at 9 AM.  Patients are seen on a first-come first-served basis, and a limited number are seen during each clinic.   [facility]  [address] [place], [place] [phone]   Eligibility Requirements You must have lived in [place], [place], or [place] counties for at least the last three months.   You cannot be eligible for state or federal sponsored [place], including [employer], [place], or [employer].   You generally cannot be eligible for healthcare insurance through your employer.    How to apply: Eligibility screenings are held every Tuesday and Wednesday afternoon from 1:00 pm until 4:00 pm. You do not need an appointment for the interview!  [facility] [address], [place], [place] [phone]   [facility] Health Department  [phone]   [facility] Health Department  [phone]   [facility] Health Department  [phone]

## 2014-10-04 NOTE — ED Notes (Signed)
Pt states his tooth fell out when he was trying to eat something about 2 weeks ago. Pt reports worsening pain since.
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
## 2014-10-05 ENCOUNTER — Encounter (HOSPITAL_COMMUNITY): Payer: Self-pay | Admitting: Emergency Medicine

## 2014-10-05 ENCOUNTER — Emergency Department (INDEPENDENT_AMBULATORY_CARE_PROVIDER_SITE_OTHER)
Admission: EM | Admit: 2014-10-05 | Discharge: 2014-10-05 | Disposition: A | Discharge status: Home / Self Care | Payer: Medicaid Other | Source: Home / Self Care | Attending: Family Medicine | Admitting: Family Medicine

## 2014-10-05 DIAGNOSIS — K089 Disorder of teeth and supporting structures, unspecified: Secondary | ICD-10-CM | POA: Diagnosis not present

## 2014-10-05 DIAGNOSIS — G8929 Other chronic pain: Secondary | ICD-10-CM

## 2014-10-05 NOTE — ED Provider Notes (Signed)
CSN: [id number]     Arrival date & time 10/05/14  1514 History   First MD Initiated Contact with Patient 10/05/14 1752     Chief Complaint  Patient presents with  . Dental Pain   (Consider location/radiation/quality/duration/timing/severity/associated sxs/prior Treatment) Patient is a 19 y.o. male presenting with tooth pain. The history is provided by the patient.  Dental Pain Location:  Upper Upper teeth location:  13/LU 2nd bicuspid Quality:  Throbbing Severity:  Moderate Chronicity:  Chronic Context: dental caries, dental fracture and enamel fracture   Context comment:  [facility], has h/o narcotic abuse, was given dental list and pcn and naprosyn. pt seeking more meds. Associated symptoms: gum swelling     Past Medical History  Diagnosis Date  . Scoliosis   . PTSD (post-traumatic stress disorder)   . Anxiety   . PTSD (post-traumatic stress disorder)   . Anxiety   . Seizures   . Chronic dental pain    Past Surgical History  Procedure Laterality Date  . Skin graft    . Nose surgery    . Choanal atresia repair     No family history on file. History  Substance Use Topics  . Smoking status: Current Every Day Smoker -- 1.00 packs/day    Types: Cigarettes  . Smokeless tobacco: Not on file  . Alcohol Use: No    Review of Systems  Constitutional: Negative.   HENT: Positive for dental problem.     Allergies  Review of patient's allergies indicates no known allergies.  Home Medications   Prior to Admission medications   Medication Sig Start Date End Date Taking? Authorizing Provider  albuterol (PROVENTIL HFA;VENTOLIN HFA) 108 (90 BASE) MCG/ACT inhaler Inhale 2 puffs into the lungs every 6 (six) hours as needed for wheezing or shortness of breath.    Historical Provider, MD  ALPRAZolam Prudy Feeler(XANAX) 1 MG tablet Take 0.5 mg by mouth 2 (two) times daily.    Historical Provider, MD  alprazolam Prudy Feeler(XANAX) 2 MG tablet Take 2 mg by mouth 2 (two) times daily.      Historical Provider, MD  HYDROcodone-acetaminophen (NORCO/VICODIN) 5-325 MG per tablet Take 1 tablet by mouth every 4 (four) hours as needed. 08/13/14   Burgess AmorJulie Idol, PA-C  ibuprofen (ADVIL,MOTRIN) 800 MG tablet Take 1 tablet (800 mg total) by mouth 3 (three) times daily. 10/04/14   Elpidio AnisShari Upstill, PA-C  naproxen (NAPROSYN) 500 MG tablet Take 1 tablet (500 mg total) by mouth 2 (two) times daily. 08/04/14   Ivery QualeHobson Bryant, PA-C  penicillin v potassium (VEETID) 500 MG tablet Take 1 tablet (500 mg total) by mouth 3 (three) times daily. 10/04/14   Shari Upstill, PA-C   BP 92/62 mmHg  Pulse 132  Temp(Src) 97.5 F (36.4 C) (Oral)  Resp 16  SpO2 97% Physical Exam  Constitutional: He is oriented to person, place, and time. He appears well-developed and well-nourished.  HENT:  Severe dental disease and problem , to be eval by oral surg.  Neck: Normal range of motion. Neck supple.  Lymphadenopathy:    He has no cervical adenopathy.  Neurological: He is alert and oriented to person, place, and time.  Skin: Skin is warm and dry.  Nursing note and vitals reviewed.   ED Course  Procedures (including critical care time) Labs Review Labs Reviewed - No data to display  Imaging Review No results found.   MDM   1. Chronic dental pain  Linna Hoff, MD 10/05/14 [phone]

## 2014-10-05 NOTE — Discharge Instructions (Signed)
See dentist for dental care as needed.

## 2014-10-05 NOTE — ED Notes (Signed)
Pt. Stated, I've been having front teeth pain and one that's abscessed on the left upper for about week.

## 2014-10-11 ENCOUNTER — Emergency Department (HOSPITAL_COMMUNITY)
Admission: EM | Admit: 2014-10-11 | Discharge: 2014-10-11 | Disposition: A | Discharge status: Home / Self Care | Payer: Medicaid Other | Attending: Emergency Medicine | Admitting: Emergency Medicine

## 2014-10-11 ENCOUNTER — Encounter (HOSPITAL_COMMUNITY): Payer: Self-pay

## 2014-10-11 DIAGNOSIS — G8929 Other chronic pain: Secondary | ICD-10-CM | POA: Insufficient documentation

## 2014-10-11 DIAGNOSIS — Z79899 Other long term (current) drug therapy: Secondary | ICD-10-CM | POA: Insufficient documentation

## 2014-10-11 DIAGNOSIS — Z8739 Personal history of other diseases of the musculoskeletal system and connective tissue: Secondary | ICD-10-CM | POA: Insufficient documentation

## 2014-10-11 DIAGNOSIS — K029 Dental caries, unspecified: Secondary | ICD-10-CM | POA: Insufficient documentation

## 2014-10-11 DIAGNOSIS — K0381 Cracked tooth: Secondary | ICD-10-CM | POA: Diagnosis not present

## 2014-10-11 DIAGNOSIS — F419 Anxiety disorder, unspecified: Secondary | ICD-10-CM | POA: Insufficient documentation

## 2014-10-11 DIAGNOSIS — Z791 Long term (current) use of non-steroidal anti-inflammatories (NSAID): Secondary | ICD-10-CM | POA: Diagnosis not present

## 2014-10-11 DIAGNOSIS — K0889 Other specified disorders of teeth and supporting structures: Secondary | ICD-10-CM

## 2014-10-11 DIAGNOSIS — R000 Tachycardia, unspecified: Secondary | ICD-10-CM | POA: Insufficient documentation

## 2014-10-11 DIAGNOSIS — Z720 Tobacco use: Secondary | ICD-10-CM | POA: Diagnosis not present

## 2014-10-11 DIAGNOSIS — Z792 Long term (current) use of antibiotics: Secondary | ICD-10-CM | POA: Insufficient documentation

## 2014-10-11 DIAGNOSIS — E860 Dehydration: Secondary | ICD-10-CM | POA: Insufficient documentation

## 2014-10-11 DIAGNOSIS — K088 Other specified disorders of teeth and supporting structures: Secondary | ICD-10-CM | POA: Diagnosis not present

## 2014-10-11 LAB — BASIC METABOLIC PANEL
ANION GAP: 13 (ref 5–15)
BUN: 10 mg/dL (ref 6–23)
CALCIUM: 9.3 mg/dL (ref 8.4–10.5)
CHLORIDE: 103 mmol/L (ref 96–112)
CO2: 21 mmol/L (ref 19–32)
Creatinine, Ser: 1.02 mg/dL (ref 0.50–1.35)
GFR calc Af Amer: >90 mL/min (ref >90)
GFR calc non Af Amer: >90 mL/min (ref >90)
GLUCOSE: 98 mg/dL (ref 70–99)
POTASSIUM: 4 mmol/L (ref 3.5–5.1)
Sodium: 137 mmol/L (ref 135–145)

## 2014-10-11 LAB — CBC WITH DIFFERENTIAL/PLATELET
BASOS ABS: 0 10*3/uL (ref 0.0–0.1)
Basophils Relative: 0 % (ref 0–1)
EOS PCT: 0 % (ref 0–5)
Eosinophils Absolute: 0 10*3/uL (ref 0.0–0.7)
HCT: 45.8 % (ref 39.0–52.0)
HEMOGLOBIN: 15.6 g/dL (ref 13.0–17.0)
LYMPHS ABS: 1.7 10*3/uL (ref 0.7–4.0)
LYMPHS PCT: 19 % (ref 12–46)
MCH: 30.2 pg (ref 26.0–34.0)
MCHC: 34.1 g/dL (ref 30.0–36.0)
MCV: 88.6 fL (ref 78.0–100.0)
MONO ABS: 0.5 10*3/uL (ref 0.1–1.0)
MONOS PCT: 5 % (ref 3–12)
NEUTROS PCT: 76 % (ref 43–77)
Neutro Abs: 6.7 10*3/uL (ref 1.7–7.7)
Platelets: 312 10*3/uL (ref 150–400)
RBC: 5.17 MIL/uL (ref 4.22–5.81)
RDW: 13.5 % (ref 11.5–15.5)
WBC: 8.8 10*3/uL (ref 4.0–10.5)

## 2014-10-11 LAB — I-STAT CG4 LACTIC ACID, ED: Lactic Acid, Venous: 2.2 mmol/L (ref 0.5–2.0)

## 2014-10-11 MED ORDER — SODIUM CHLORIDE 0.9 % IV BOLUS (SEPSIS)
1000.0000 mL | Freq: Once | INTRAVENOUS | Status: AC
  Administered 2014-10-11: 1000 mL via INTRAVENOUS

## 2014-10-11 MED ORDER — HYDROMORPHONE HCL 1 MG/ML IJ SOLN: 1.0000 mg | Freq: Once | INTRAMUSCULAR | Status: DC

## 2014-10-11 MED ORDER — KETOROLAC TROMETHAMINE 30 MG/ML IJ SOLN
30.0000 mg | Freq: Once | INTRAMUSCULAR | Status: AC
  Administered 2014-10-11: 30 mg via INTRAVENOUS
  Filled 2014-10-11: qty 1

## 2014-10-11 MED ORDER — CLINDAMYCIN HCL 150 MG PO CAPS: 450.0000 mg | ORAL_CAPSULE | Freq: Three times a day (TID) | ORAL | Status: DC

## 2014-10-11 MED ORDER — OXYCODONE-ACETAMINOPHEN 5-325 MG PO TABS: 1.0000 | ORAL_TABLET | Freq: Four times a day (QID) | ORAL | Status: DC | PRN

## 2014-10-11 MED ORDER — HYDROMORPHONE HCL 1 MG/ML IJ SOLN
1.0000 mg | Freq: Once | INTRAMUSCULAR | Status: AC
  Administered 2014-10-11: 1 mg via INTRAVENOUS
  Filled 2014-10-11: qty 1

## 2014-10-11 NOTE — ED Notes (Signed)
Joe, PA aware of abnormal lab test results

## 2014-10-11 NOTE — ED Provider Notes (Signed)
CSN: [id number]     Arrival date & time 10/11/14  1437 History   First MD Initiated Contact with Patient 10/11/14 1449     Chief Complaint  Patient presents with  . Dental Pain   Patient is a 19 y.o. male presenting with tooth pain.  Dental Pain Associated symptoms: no fever    This chart was scribed for non-physician practitioner Ladona Mow, PA-C, working with No att. providers found, by Andrew Au, ED Scribe. This patient was seen in room TR06C/TR06C and the patient's care was started at 6:26 PM.  Vincent Green is a 19 y.o. male who presents to the Emergency Department complaining of dental pain. Pt was seen last week at urgent care and [facility] for the same complaint. Pt has had dental pain for a month but states pain worsened within the last week with a dental abscess, trouble sleep, eating, and drinking. Pt was seen at [facility] yesterday and was prescribed amoxicillin. He has an upcoming appointment with dental surgeon on May 11.  Pt denies fevers. Pt denies drug allergies.    Past Medical History  Diagnosis Date  . Scoliosis   . PTSD (post-traumatic stress disorder)   . Anxiety   . PTSD (post-traumatic stress disorder)   . Anxiety   . Chronic dental pain   . Seizures     benzo-seizure    Past Surgical History  Procedure Laterality Date  . Skin graft    . Nose surgery    . Choanal atresia repair     History reviewed. No pertinent family history. History  Substance Use Topics  . Smoking status: Current Every Day Smoker -- 1.00 packs/day    Types: Cigarettes  . Smokeless tobacco: Not on file  . Alcohol Use: No    Review of Systems  Constitutional: Negative for fever.  HENT: Positive for dental problem.     Allergies  Review of patient's allergies indicates no known allergies.  Home Medications   Prior to Admission medications   Medication Sig Start Date End Date Taking? Authorizing Provider  albuterol (PROVENTIL HFA;VENTOLIN HFA) 108 (90 BASE) MCG/ACT  inhaler Inhale 2 puffs into the lungs every 6 (six) hours as needed for wheezing or shortness of breath.    Historical Provider, MD  ALPRAZolam Prudy Feeler) 1 MG tablet Take 0.5 mg by mouth 2 (two) times daily.    Historical Provider, MD  alprazolam Prudy Feeler) 2 MG tablet Take 2 mg by mouth 2 (two) times daily.     Historical Provider, MD  clindamycin (CLEOCIN) 150 MG capsule Take 3 capsules (450 mg total) by mouth 3 (three) times daily. 10/11/14   Ladona Mow, PA-C  HYDROcodone-acetaminophen (NORCO/VICODIN) 5-325 MG per tablet Take 1 tablet by mouth every 4 (four) hours as needed. 08/13/14   Burgess Amor, PA-C  ibuprofen (ADVIL,MOTRIN) 800 MG tablet Take 1 tablet (800 mg total) by mouth 3 (three) times daily. 10/04/14   Elpidio Anis, PA-C  naproxen (NAPROSYN) 500 MG tablet Take 1 tablet (500 mg total) by mouth 2 (two) times daily. 08/04/14   Ivery Quale, PA-C  oxyCODONE-acetaminophen (PERCOCET) 5-325 MG per tablet Take 1-2 tablets by mouth every 6 (six) hours as needed. 10/11/14   Ladona Mow, PA-C  penicillin v potassium (VEETID) 500 MG tablet Take 1 tablet (500 mg total) by mouth 3 (three) times daily. 10/04/14   Shari Upstill, PA-C   BP 122/68 mmHg  Pulse 94  Temp(Src) 98.1 F (36.7 C) (Oral)  Resp 20  Ht 6' (1.829  m)  Wt 150 lb (68.04 kg)  BMI 20.34 kg/m2  SpO2 100% Physical Exam  Constitutional: He is oriented to person, place, and time. He appears well-developed and well-nourished. No distress.  HENT:  Head: Normocephalic and atraumatic.  Mouth/Throat: Uvula is midline and oropharynx is clear and moist. Mucous membranes are dry. No trismus in the jaw. No uvula swelling. No oropharyngeal exudate, posterior oropharyngeal edema, posterior oropharyngeal erythema or tonsillar abscesses.  Diffuse dental caries with multiple broken teeth.  Eyes: Conjunctivae and EOM are normal. Right eye exhibits no discharge. Left eye exhibits no discharge. No scleral icterus.  Neck: Normal range of motion and full passive  range of motion without pain. Neck supple. No spinous process tenderness and no muscular tenderness present. No rigidity. No edema, no erythema and normal range of motion present. No Brudzinski's sign and no Kernig's sign noted.  Cardiovascular: Regular rhythm, S1 normal, S2 normal and normal heart sounds.  Tachycardia present.   No murmur heard. Pulmonary/Chest: Effort normal and breath sounds normal. No accessory muscle usage. No tachypnea. No respiratory distress.  Abdominal: Soft. Normal appearance and bowel sounds are normal. There is no tenderness.  Musculoskeletal: Normal range of motion. He exhibits no edema or tenderness.  Neurological: He is alert and oriented to person, place, and time. He has normal strength. No cranial nerve deficit or sensory deficit. He displays a negative Romberg sign. Coordination and gait normal. GCS eye subscore is 4. GCS verbal subscore is 5. GCS motor subscore is 6.  Patient fully alert, answering questions appropriately in full, clear sentences. Cranial nerves II through XII grossly intact. Motor strength 5 out of 5 in all major muscle groups of upper and lower extremities. Distal sensation intact.   Skin: Skin is warm and dry. No rash noted. He is not diaphoretic.  Psychiatric: He has a normal mood and affect. His behavior is normal.  Nursing note and vitals reviewed.   ED Course  Procedures (including critical care time) DIAGNOSTIC STUDIES: Oxygen Saturation is 100% on RA, normal by my interpretation.    COORDINATION OF CARE: 6:26 PM- Pt advised of plan for treatment and pt agrees.  Labs Review Labs Reviewed  I-STAT CG4 LACTIC ACID, ED - Abnormal; Notable for the following:    Lactic Acid, Venous 2.20 (*)    All other components within normal limits  CBC WITH DIFFERENTIAL/PLATELET  BASIC METABOLIC PANEL    Imaging Review No results found.   EKG Interpretation None      MDM   Final diagnoses:  Pain, dental    Patient here with  ongoing dental pain. Patient has significant dental caries diffusely in his mouth. Patient states he has had several evaluations for this same dental pain, is only here for pain management today. Patient stating he has not been eating or drinking appropriately in the past week. Patient appears mildly dehydrated on exam. We'll provide pain management and rehydration with IV fluids here.Initially patient noted to be tachycardic at 130 on exam, this resolved after IV hydration, thought to be due to dehydration. Patient denying any cardiac or pulmonary symptoms. No concern for ACS, PE, pneumonia, sepsis or SIRS.   After 2 L of fluid, pain under control, heart rate in the mid 90s. Patient afebrile, nontoxic, well-appearing, hemodynamically stable and in no acute distress. Pain managed effectively. Labs reassuring, no evidence of acute pathology. Patient looked upon database, does not have any narcotic prescriptions within the past 2 months. We'll provide patient with pain management based  on intractable pain, and strongly encouraged follow-up with oral surgeon. Patient states he has a appointment already scheduled with an oral surgeon in May. Encouraged patient to continue amoxicillin, switch medicine to clindamycin if he does not have any improvement of symptoms in the next 2 days. Discussed return precautions with patient, patient verbalizes understanding and agreement of this plan. Encouraged patient to call or return to the ER with any worsening symptoms or should he have any questions or concerns.  I personally performed the services described in this documentation, which was scribed in my presence. The recorded information has been reviewed and is accurate.  BP 122/68 mmHg  Pulse 94  Temp(Src) 98.1 F (36.7 C) (Oral)  Resp 20  Ht 6' (1.829 m)  Wt 150 lb (68.04 kg)  BMI 20.34 kg/m2  SpO2 100%  Signed,  Ladona Mow, PA-C 6:26 PM    Ladona Mow, PA-C 10/11/14 1826  Dione Booze, MD 10/11/14  Luiz Iron  Dione Booze, MD 10/11/14 [phone]

## 2014-10-11 NOTE — ED Notes (Signed)
Pt has appt with oral surgeon 10-28-14 for teeth extractions.  [facility], Amoxicillin and Lodine.  No relief in pain.  Pain has worsened inn the past week.   Eating and drinking very little.

## 2014-10-11 NOTE — Discharge Instructions (Signed)
Follow-up with your oral surgeon as scheduled. If you do not begin to feel any improvement on amoxicillin after 2 days, fill your prescription for clindamycin and begin to take that as prescribed for your dental abscesses.  Dental Pain A tooth ache may be caused by cavities (tooth decay). Cavities expose the nerve of the tooth to air and hot or cold temperatures. It may come from an infection or abscess (also called a boil or furuncle) around your tooth. It is also often caused by dental caries (tooth decay). This causes the pain you are having. DIAGNOSIS  Your caregiver can diagnose this problem by exam. TREATMENT   If caused by an infection, it may be treated with medications which kill germs (antibiotics) and pain medications as prescribed by your caregiver. Take medications as directed.  Only take over-the-counter or prescription medicines for pain, discomfort, or fever as directed by your caregiver.  Whether the tooth ache today is caused by infection or dental disease, you should see your dentist as soon as possible for further care. SEEK MEDICAL CARE IF: The exam and treatment you received today has been provided on an emergency basis only. This is not a substitute for complete medical or dental care. If your problem worsens or new problems (symptoms) appear, and you are unable to meet with your dentist, call or return to this location. SEEK IMMEDIATE MEDICAL CARE IF:   You have a fever.  You develop redness and swelling of your face, jaw, or neck.  You are unable to open your mouth.  You have severe pain uncontrolled by pain medicine. MAKE SURE YOU:   Understand these instructions.  Will watch your condition.  Will get help right away if you are not doing well or get worse. Document Released: 06/05/2005 Document Revised: 08/28/2011 Document Reviewed: 01/22/2008 [facility] Patient Information 2015 [place], [place]. This information is not intended to replace advice given to you by  your health care provider. Make sure you discuss any questions you have with your health care provider.  Dental Abscess A dental abscess is a collection of infected fluid (pus) from a bacterial infection in the inner part of the tooth (pulp). It usually occurs at the end of the tooth's root.  CAUSES   Severe tooth decay.  Trauma to the tooth that allows bacteria to enter into the pulp, such as a broken or chipped tooth. SYMPTOMS   Severe pain in and around the infected tooth.  Swelling and redness around the abscessed tooth or in the mouth or face.  Tenderness.  Pus drainage.  Bad breath.  Bitter taste in the mouth.  Difficulty swallowing.  Difficulty opening the mouth.  Nausea.  Vomiting.  Chills.  Swollen neck glands. DIAGNOSIS   A medical and dental history will be taken.  An examination will be performed by tapping on the abscessed tooth.  X-rays may be taken of the tooth to identify the abscess. TREATMENT The goal of treatment is to eliminate the infection. You may be prescribed antibiotic medicine to stop the infection from spreading. A root canal may be performed to save the tooth. If the tooth cannot be saved, it may be pulled (extracted) and the abscess may be drained.  HOME CARE INSTRUCTIONS  Only take over-the-counter or prescription medicines for pain, fever, or discomfort as directed by your caregiver.  Rinse your mouth (gargle) often with salt water ( tsp salt in 8 oz [250 ml] of warm water) to relieve pain or swelling.  Do not drive  after taking pain medicine (narcotics).  Do not apply heat to the outside of your face.  Return to your dentist for further treatment as directed. SEEK MEDICAL CARE IF:  Your pain is not helped by medicine.  Your pain is getting worse instead of better. SEEK IMMEDIATE MEDICAL CARE IF:  You have a fever or persistent symptoms for more than 2-3 days.  You have a fever and your symptoms suddenly get worse.  You  have chills or a very bad headache.  You have problems breathing or swallowing.  You have trouble opening your mouth.  You have swelling in the neck or around the eye. Document Released: 06/05/2005 Document Revised: 02/28/2012 Document Reviewed: 09/13/2010 [facility] Patient Information 2015 [place], [place]. This information is not intended to replace advice given to you by your health care provider. Make sure you discuss any questions you have with your health care provider.

## 2014-10-11 NOTE — ED Notes (Signed)
Declined W/C at D/C and was escorted to lobby by RN. 

## 2014-10-26 ENCOUNTER — Emergency Department (HOSPITAL_COMMUNITY)
Admission: EM | Admit: 2014-10-26 | Discharge: 2014-10-26 | Discharge status: Against Medical Advice | Payer: Medicaid Other | Attending: Emergency Medicine | Admitting: Emergency Medicine

## 2014-10-26 ENCOUNTER — Encounter (HOSPITAL_COMMUNITY): Payer: Self-pay | Admitting: Emergency Medicine

## 2014-10-26 DIAGNOSIS — Z8739 Personal history of other diseases of the musculoskeletal system and connective tissue: Secondary | ICD-10-CM | POA: Diagnosis not present

## 2014-10-26 DIAGNOSIS — F419 Anxiety disorder, unspecified: Secondary | ICD-10-CM | POA: Insufficient documentation

## 2014-10-26 DIAGNOSIS — K029 Dental caries, unspecified: Secondary | ICD-10-CM

## 2014-10-26 DIAGNOSIS — F4310 Post-traumatic stress disorder, unspecified: Secondary | ICD-10-CM | POA: Insufficient documentation

## 2014-10-26 DIAGNOSIS — Z8669 Personal history of other diseases of the nervous system and sense organs: Secondary | ICD-10-CM | POA: Insufficient documentation

## 2014-10-26 DIAGNOSIS — G8929 Other chronic pain: Secondary | ICD-10-CM | POA: Insufficient documentation

## 2014-10-26 DIAGNOSIS — Z720 Tobacco use: Secondary | ICD-10-CM | POA: Diagnosis not present

## 2014-10-26 DIAGNOSIS — K088 Other specified disorders of teeth and supporting structures: Secondary | ICD-10-CM | POA: Diagnosis present

## 2014-10-26 NOTE — ED Notes (Signed)
Pt in restroom for extended period of time, states he is unable to urinate and he "plans to speak to the doctor about that". Asked for a cup of water to "help" him urinate.

## 2014-10-26 NOTE — Discharge Instructions (Signed)
Increase the advil or motrin to 3 tabs 4 times a day for pain, with acetaminophen 650 mg 4 times a day. Get the prescription for the clindamycin filled and start taking it. Keep your appointment with the dentist on the 11th. You will need to talk to your lawyer about the conflict between your dentist appointment and your court date.     Dental Caries Dental caries is tooth decay. This decay can cause a hole in teeth (cavity) that can get bigger and deeper over time. HOME CARE  Brush and floss your teeth. Do this at least two times a day.  Use a fluoride toothpaste.  Use a mouth rinse if told by your dentist or doctor.  Eat less sugary and starchy foods. Drink less sugary drinks.  Avoid snacking often on sugary and starchy foods. Avoid sipping often on sugary drinks.  Keep regular checkups and cleanings with your dentist.  Use fluoride supplements if told by your dentist or doctor.  Allow fluoride to be applied to teeth if told by your dentist or doctor. Document Released: 03/14/2008 Document Revised: 10/20/2013 Document Reviewed: 06/07/2012 [facility] Patient Information 2015 [place], [place]. This information is not intended to replace advice given to you by your health care provider. Make sure you discuss any questions you have with your health care provider.

## 2014-10-26 NOTE — ED Notes (Addendum)
Pt states he has 2 abscesses in mouth x2 mths, with new abscess to right upper mouth x 3days. On amoxicillin currently. Told by [facility] to switch to clindamycin if abscess did not get better. Pt has the scprit but has not switched. Also given pain medication. Has taken all pain medication. Pt aslo states he has back pain from pre-existing condition, and is having trouble with urination. Father in room with pt, both uncertain of certain details of diagnosis and treatments from previous visits to Doctors, ED and Dentists. Pt states he has had several visits to ED and has a dentist/oral surgeon appt. May 11th. Also states the dental visit may need to be rescheduled due to a court appearance.

## 2014-10-26 NOTE — ED Provider Notes (Signed)
CSN: [id number]     Arrival date & time 10/26/14  0225 History   First MD Initiated Contact with Patient 10/26/14 0249     Chief Complaint  Patient presents with  . Dental Pain  . Back Pain     (Consider location/radiation/quality/duration/timing/severity/associated sxs/prior Treatment) HPI Patient is here complaining of diffuse dental pain across his upper teeth he states he was seen in the ED a couple weeks ago and put on amoxicillin and he was also given a prescription for clindamycin if he did not improve. He states about 3 days ago he started getting an abscess around one of his left upper teeth. He denies fever, trouble swallowing or breathing. He has not gotten the clindamycin filled yet. He has been taking 1 or 2 Advil every 6 hours for pain. Patient states he has an appointment on May 11 to see a dentist. This is this patient's 6th  ED visit since February for dental pain.    PCP none  Past Medical History  Diagnosis Date  . Scoliosis   . PTSD (post-traumatic stress disorder)   . Anxiety   . PTSD (post-traumatic stress disorder)   . Anxiety   . Chronic dental pain   . Seizures     benzo-seizure    Past Surgical History  Procedure Laterality Date  . Skin graft    . Nose surgery    . Choanal atresia repair     History reviewed. No pertinent family history. History  Substance Use Topics  . Smoking status: Current Every Day Smoker -- 1.00 packs/day    Types: Cigarettes  . Smokeless tobacco: Not on file  . Alcohol Use: No    Review of Systems  All other systems reviewed and are negative.     Allergies  Review of patient's allergies indicates no known allergies.  Home Medications   Prior to Admission medications   Medication Sig Start Date End Date Taking? Authorizing Provider  albuterol (PROVENTIL HFA;VENTOLIN HFA) 108 (90 BASE) MCG/ACT inhaler Inhale 2 puffs into the lungs every 6 (six) hours as needed for wheezing or shortness of breath.    Historical  Provider, MD  ALPRAZolam Prudy Feeler(XANAX) 1 MG tablet Take 0.5 mg by mouth 2 (two) times daily.    Historical Provider, MD  alprazolam Prudy Feeler(XANAX) 2 MG tablet Take 2 mg by mouth 2 (two) times daily.     Historical Provider, MD  clindamycin (CLEOCIN) 150 MG capsule Take 3 capsules (450 mg total) by mouth 3 (three) times daily. 10/11/14   Ladona MowJoe Mintz, PA-C  HYDROcodone-acetaminophen (NORCO/VICODIN) 5-325 MG per tablet Take 1 tablet by mouth every 4 (four) hours as needed. 08/13/14   Burgess AmorJulie Idol, PA-C  ibuprofen (ADVIL,MOTRIN) 800 MG tablet Take 1 tablet (800 mg total) by mouth 3 (three) times daily. 10/04/14   Elpidio AnisShari Upstill, PA-C  naproxen (NAPROSYN) 500 MG tablet Take 1 tablet (500 mg total) by mouth 2 (two) times daily. 08/04/14   Ivery QualeHobson Bryant, PA-C  oxyCODONE-acetaminophen (PERCOCET) 5-325 MG per tablet Take 1-2 tablets by mouth every 6 (six) hours as needed. 10/11/14   Ladona MowJoe Mintz, PA-C  penicillin v potassium (VEETID) 500 MG tablet Take 1 tablet (500 mg total) by mouth 3 (three) times daily. 10/04/14   Shari Upstill, PA-C   BP 134/75 mmHg  Pulse 124  Temp(Src) 99 F (37.2 C) (Oral)  Resp 16  Ht 6' (1.829 m)  Wt 158 lb (71.668 kg)  BMI 21.42 kg/m2  SpO2 100%  Vital signs normal  Physical Exam  Constitutional: He is oriented to person, place, and time. He appears well-developed and well-nourished.  Non-toxic appearance. He does not appear ill. No distress.  HENT:  Head: Normocephalic and atraumatic.  Right Ear: External ear normal.  Left Ear: External ear normal.  Nose: Nose normal. No mucosal edema or rhinorrhea.  Mouth/Throat: Oropharynx is clear and moist and mucous membranes are normal. No dental abscesses or uvula swelling.  Patient has many missing teeth, several broken at the gumline with enamel loss of the teeth that are present at the gumline. Basically he has no normal teeth present. He has some mild redness of his gums without localized abscess formation.  Eyes: Conjunctivae and EOM are normal.  Pupils are equal, round, and reactive to light.  Neck: Normal range of motion and full passive range of motion without pain. Neck supple.  Cardiovascular: Normal rate, regular rhythm and normal heart sounds.  Exam reveals no gallop and no friction rub.   No murmur heard. Pulmonary/Chest: Effort normal and breath sounds normal. No respiratory distress. He has no wheezes. He has no rhonchi. He has no rales. He exhibits no tenderness and no crepitus.  Abdominal: Soft. Normal appearance and bowel sounds are normal. He exhibits no distension. There is no tenderness. There is no rebound and no guarding.  Musculoskeletal: Normal range of motion. He exhibits no edema or tenderness.  Moves all extremities well.   Neurological: He is alert and oriented to person, place, and time. He has normal strength. No cranial nerve deficit.  Skin: Skin is warm, dry and intact. No rash noted. No erythema. No pallor.  Psychiatric: He has a normal mood and affect. His speech is normal and behavior is normal. His mood appears not anxious.  Nursing note and vitals reviewed.   ED Course  Procedures (including critical care time)  Patient offered Toradol for pain. Patient refused.  Urine drug screen was ordered however patient was unable to provide a urine sample.  At the end of our visit patient states he has a court date on May 11, the same date as his dental appointment. He was advised he needed to work that out between his lawyer and his dentist. Patient has been unable to give me the name of the dentist he is supposed to see.  Review of the [place] database shows patient has gotten 13 narcotic prescriptions since November 18. There are 9 prescribers with most being from our emergency department, one [facility] in December, one from [facility] December.    Labs Review Labs Reviewed  URINE RAPID DRUG SCREEN (HOSP PERFORMED)    Imaging Review No results  found.   EKG Interpretation None      MDM   Final diagnoses:  Dental caries   Patient is to get his prescription for clindamycin filled and follow-up with his dentist.   Plan discharge  Devoria AlbeIva Rameses Ou, MD, Concha PyoFACEP     Ahsha Hinsley, MD 10/26/14 [phone]

## 2014-10-26 NOTE — ED Notes (Signed)
Pt and father left room after being told by EDP without informing nurse. Did not return to room. Did not wait for discharge instructions or recommendations.

## 2014-12-11 ENCOUNTER — Emergency Department (HOSPITAL_COMMUNITY)
Admission: EM | Admit: 2014-12-11 | Discharge: 2014-12-11 | Disposition: A | Discharge status: Home / Self Care | Payer: Medicaid Other | Attending: Emergency Medicine | Admitting: Emergency Medicine

## 2014-12-11 ENCOUNTER — Encounter (HOSPITAL_COMMUNITY): Payer: Self-pay | Admitting: Emergency Medicine

## 2014-12-11 DIAGNOSIS — Z8739 Personal history of other diseases of the musculoskeletal system and connective tissue: Secondary | ICD-10-CM | POA: Insufficient documentation

## 2014-12-11 DIAGNOSIS — Z792 Long term (current) use of antibiotics: Secondary | ICD-10-CM | POA: Insufficient documentation

## 2014-12-11 DIAGNOSIS — F419 Anxiety disorder, unspecified: Secondary | ICD-10-CM | POA: Diagnosis not present

## 2014-12-11 DIAGNOSIS — G8929 Other chronic pain: Secondary | ICD-10-CM | POA: Diagnosis not present

## 2014-12-11 DIAGNOSIS — K0889 Other specified disorders of teeth and supporting structures: Secondary | ICD-10-CM

## 2014-12-11 DIAGNOSIS — K088 Other specified disorders of teeth and supporting structures: Secondary | ICD-10-CM | POA: Diagnosis not present

## 2014-12-11 DIAGNOSIS — Z79899 Other long term (current) drug therapy: Secondary | ICD-10-CM | POA: Diagnosis not present

## 2014-12-11 DIAGNOSIS — Z720 Tobacco use: Secondary | ICD-10-CM | POA: Diagnosis not present

## 2014-12-11 MED ORDER — PENICILLIN V POTASSIUM 500 MG PO TABS: 500.0000 mg | ORAL_TABLET | Freq: Four times a day (QID) | ORAL | Status: AC

## 2014-12-11 MED ORDER — OXYCODONE-ACETAMINOPHEN 5-325 MG PO TABS: 1.0000 | ORAL_TABLET | ORAL | Status: DC | PRN

## 2014-12-11 MED ORDER — IBUPROFEN 800 MG PO TABS: 800.0000 mg | ORAL_TABLET | Freq: Three times a day (TID) | ORAL | Status: DC

## 2014-12-11 MED ORDER — OXYCODONE-ACETAMINOPHEN 5-325 MG PO TABS
2.0000 | ORAL_TABLET | Freq: Once | ORAL | Status: AC
  Administered 2014-12-11: 2 via ORAL
  Filled 2014-12-11: qty 2

## 2014-12-11 NOTE — ED Provider Notes (Signed)
CSN: [id number]     Arrival date & time 12/11/14  0901 History  This chart was scribed for non-physician practitioner Joycie Peek, PA-C, working with Raeford Razor, MD, by Tanda Rockers, ED Scribe. This patient was seen in room TR07C/TR07C and the patient's care was started at 9:39 AM.   Chief Complaint  Patient presents with  . Dental Pain   The history is provided by the patient. No language interpreter was used.   HPI Comments: Vincent Green is a 19 y.o. male who presents to the Emergency Department complaining of upper dental pain that began around 4-5 days ago, worsening recently. He rates the pain as a 8/10 on the pain scale. Pt notes 3 dental abscesses, which is causing his pain. He has been taking ibuprofen without relief. Pt has pre operation surgery on 12/16/2014 (5 days from now) with [facility]. Denies fever, difficulty breathing, difficulty swallowing, or any other symptoms.    Past Medical History  Diagnosis Date  . Scoliosis   . PTSD (post-traumatic stress disorder)   . Anxiety   . PTSD (post-traumatic stress disorder)   . Anxiety   . Chronic dental pain   . Seizures     benzo-seizure    Past Surgical History  Procedure Laterality Date  . Skin graft    . Nose surgery    . Choanal atresia repair     No family history on file. History  Substance Use Topics  . Smoking status: Current Every Day Smoker -- 1.00 packs/day    Types: Cigarettes  . Smokeless tobacco: Not on file  . Alcohol Use: No    Review of Systems  Constitutional: Negative for fever and chills.  HENT: Positive for dental problem. Negative for trouble swallowing.   Respiratory: Negative for shortness of breath.   All other systems reviewed and are negative.     Allergies  Review of patient's allergies indicates no known allergies.  Home Medications   Prior to Admission medications   Medication Sig Start Date End Date Taking? Authorizing Provider  albuterol (PROVENTIL  HFA;VENTOLIN HFA) 108 (90 BASE) MCG/ACT inhaler Inhale 2 puffs into the lungs every 6 (six) hours as needed for wheezing or shortness of breath.    Historical Provider, MD  ALPRAZolam Prudy Feeler) 1 MG tablet Take 0.5 mg by mouth 2 (two) times daily.    Historical Provider, MD  alprazolam Prudy Feeler) 2 MG tablet Take 2 mg by mouth 2 (two) times daily.     Historical Provider, MD  clindamycin (CLEOCIN) 150 MG capsule Take 3 capsules (450 mg total) by mouth 3 (three) times daily. 10/11/14   Ladona Mow, PA-C  HYDROcodone-acetaminophen (NORCO/VICODIN) 5-325 MG per tablet Take 1 tablet by mouth every 4 (four) hours as needed. 08/13/14   Burgess Amor, PA-C  ibuprofen (ADVIL,MOTRIN) 800 MG tablet Take 1 tablet (800 mg total) by mouth 3 (three) times daily. 12/11/14   Joycie Peek, PA-C  naproxen (NAPROSYN) 500 MG tablet Take 1 tablet (500 mg total) by mouth 2 (two) times daily. 08/04/14   Ivery Quale, PA-C  oxyCODONE-acetaminophen (PERCOCET) 5-325 MG per tablet Take 1-2 tablets by mouth every 4 (four) hours as needed. 12/11/14   Joycie Peek, PA-C  penicillin v potassium (VEETID) 500 MG tablet Take 1 tablet (500 mg total) by mouth 4 (four) times daily. 12/11/14 12/18/14  Joycie Peek, PA-C   Triage Vitals: BP 122/72 mmHg  Pulse 93  Temp(Src) 97.2 F (36.2 C) (Oral)  Resp 16  Ht 6' (1.829  m)  Wt 150 lb (68.04 kg)  BMI 20.34 kg/m2  SpO2 100%   Physical Exam  Constitutional: He is oriented to person, place, and time. He appears well-developed and well-nourished. No distress.  HENT:  Head: Normocephalic and atraumatic.  Discomfort located to the left maxillary canines and premolars, right mandibular canines and premolars, maxillary incisors. Overall poor dentition. Multiple missing teeth with active caries. Mucous membranes are moist. No unilateral tonsillar swelling, uvula midline, no glossal swelling or elevation. No trismus. No fluctuance or evidence of a drainable abscess. No other evidence of emergent  infection, Retropharyngeal or Peritonsillar abscess, Ludwig or Vincents angina. Tolerating secretions well. Patent airway   Eyes: Conjunctivae and EOM are normal.  Neck: Neck supple. No tracheal deviation present.  Cardiovascular: Normal rate.   Pulmonary/Chest: Effort normal. No respiratory distress.  Musculoskeletal: Normal range of motion.  Neurological: He is alert and oriented to person, place, and time.  Skin: Skin is warm and dry.  Psychiatric: He has a normal mood and affect. His behavior is normal.  Nursing note and vitals reviewed.   ED Course  Procedures (including critical care time) NERVE BLOCK Performed by: Sharlene Motts Consent: Verbal consent obtained. Required items: required blood products, implants, devices, and special equipment available Time out: Immediately prior to procedure a "time out" was called to verify the correct patient, procedure, equipment, support staff and site/side marked as required.  Indication: Dental pain  Nerve block body site: Superior periosteal   Preparation: Patient was prepped and draped in the usual sterile fashion. Needle gauge: 24 G Location technique: anatomical landmarks  Local anesthetic: Bupivacaine   Anesthetic total: 1.8 ml  Outcome: pain improved Patient tolerance: Patient tolerated the procedure well with no immediate complications. Block  NERVE BLOCK Performed by: Sharlene Motts Consent: Verbal consent obtained. Required items: required blood products, implants, devices, and special equipment available Time out: Immediately prior to procedure a "time out" was called to verify the correct patient, procedure, equipment, support staff and site/side marked as required.  Indication: Dental pain  Nerve block body site: Middle superior alveolar   Preparation: Patient was prepped and draped in the usual sterile fashion. Needle gauge: 24 G Location technique: anatomical landmarks  Local anesthetic:  Bupivacaine   Anesthetic total: 1.8 ml  Outcome: pain improved Patient tolerance: Patient tolerated the procedure well with no immediate complications. bl  DIAGNOSTIC STUDIES: Oxygen Saturation is 100% on RA, normal by my interpretation.    COORDINATION OF CARE: 9:42 AM-Discussed treatment plan which includes dental block with pt at bedside and pt agreed to plan.   Labs Review Labs Reviewed - No data to display  Imaging Review No results found.   EKG Interpretation None     Meds given in ED:  Medications  oxyCODONE-acetaminophen (PERCOCET/ROXICET) 5-325 MG per tablet 2 tablet (2 tablets Oral Given 12/11/14 0956)    Discharge Medication List as of 12/11/2014  9:59 AM     Filed Vitals:   12/11/14 0917 12/11/14 0929  BP: 122/72 122/72  Pulse: 93 93  Temp: 97.2 F (36.2 C) 97.2 F (36.2 C)  TempSrc: Oral Oral  Resp: 16   Height:  6' (1.829 m)  Weight:  150 lb (68.04 kg)  SpO2: 100% 100%    MDM  Vitals stable - WNL -afebrile Pt resting comfortably in ED. Patient feels much better after oral nerve block done in ED. PE--patient with poor overall dentition. No evidence of peritonsillar, retropharyngeal abscess, Ludwig angina. Patient has appointment  on June 29th with [facility]. Will place on antibiotic's, pain medicines. No evidence of other acute or emergent pathology at this time.  I discussed all relevant lab findings and imaging results with pt and they verbalized understanding. Discussed f/u with PCP within 48 hrs and return precautions, pt very amenable to plan.  Final diagnoses:  Pain, dental   I personally performed the services described in this documentation, which was scribed in my presence. The recorded information has been reviewed and is accurate.     Joycie Peek, PA-C 12/11/14 1509  Raeford Razor, MD 12/12/14 [phone]

## 2014-12-11 NOTE — ED Notes (Signed)
Pt has VERY poor dentition. States has pre-op appointment for dental surgery next week. Lost abx Rx for his last abscess so took partial, old Amoxicillin Rx.

## 2014-12-11 NOTE — ED Notes (Signed)
Declined W/C at D/C and was escorted to lobby by RN. 

## 2014-12-11 NOTE — Discharge Instructions (Signed)

## 2014-12-29 ENCOUNTER — Emergency Department (HOSPITAL_COMMUNITY)
Admission: EM | Admit: 2014-12-29 | Discharge: 2014-12-29 | Disposition: A | Discharge status: Home / Self Care | Payer: Medicaid Other | Attending: Emergency Medicine | Admitting: Emergency Medicine

## 2014-12-29 DIAGNOSIS — G8929 Other chronic pain: Secondary | ICD-10-CM | POA: Diagnosis not present

## 2014-12-29 DIAGNOSIS — K002 Abnormalities of size and form of teeth: Secondary | ICD-10-CM | POA: Diagnosis not present

## 2014-12-29 DIAGNOSIS — K029 Dental caries, unspecified: Secondary | ICD-10-CM | POA: Insufficient documentation

## 2014-12-29 DIAGNOSIS — F419 Anxiety disorder, unspecified: Secondary | ICD-10-CM | POA: Insufficient documentation

## 2014-12-29 DIAGNOSIS — M419 Scoliosis, unspecified: Secondary | ICD-10-CM | POA: Diagnosis not present

## 2014-12-29 DIAGNOSIS — Z79899 Other long term (current) drug therapy: Secondary | ICD-10-CM | POA: Diagnosis not present

## 2014-12-29 DIAGNOSIS — K088 Other specified disorders of teeth and supporting structures: Secondary | ICD-10-CM | POA: Insufficient documentation

## 2014-12-29 DIAGNOSIS — Z792 Long term (current) use of antibiotics: Secondary | ICD-10-CM | POA: Insufficient documentation

## 2014-12-29 DIAGNOSIS — Z791 Long term (current) use of non-steroidal anti-inflammatories (NSAID): Secondary | ICD-10-CM | POA: Diagnosis not present

## 2014-12-29 DIAGNOSIS — Z720 Tobacco use: Secondary | ICD-10-CM | POA: Diagnosis not present

## 2014-12-29 DIAGNOSIS — K089 Disorder of teeth and supporting structures, unspecified: Secondary | ICD-10-CM

## 2014-12-29 MED ORDER — LIDOCAINE VISCOUS 2 % MT SOLN: 15.0000 mL | Freq: Once | OROMUCOSAL | Status: DC

## 2014-12-29 MED ORDER — LIDOCAINE VISCOUS 2 % MT SOLN: 20.0000 mL | OROMUCOSAL | Status: DC | PRN

## 2014-12-29 NOTE — Discharge Instructions (Signed)
Please follow up with your primary care physician in 1-2 days. If you do not have one please call the [facility] number listed above. Please read all discharge instructions and return precautions.   RESOURCE GUIDE  If you do not have a primary care doctor to follow up with regarding today's visit, please call the [facility] at [phone] to make an appointment. Hours of operation are 10am - 7pm, Monday through Friday, and they have a sliding scale fee.    Dental Assistance Please contact the on-call dentist listed on your discharge papers WITHIN 48 HOURS.  If the on-call dentist agrees that your condition is emergent, there is a CHANCE (not a guarantee) that you MAY receive a savings on your visit at this point. If you wait more than 48 hours, it will not be considered an emergency.   Drs. Moreen Fowleravid and Janna Civils:  [address], [place], [place], [address], [phone]  Short-notice availability  Tooth evaluation: $100  Emergency Treatment: $200 (including exam, xrays, tooth extraction, and post-op visit)  Patients with Medicaid: [facility] [phone], [phone] [address], [phone]  If unable to pay, or uninsured, contact [facility] [phone] in [place], [phone] in [facility]) to become qualified for the adult dental clinic  Other Low-Cost Community [facility] Services: - [facility]: [address], [place], [place], [address], [phone], [phone], 2nd and 4th Thursday of the month at 6:30am.  10 clients each day by appointment, can sometimes see walk-in patients if someone does not show for an appointment. [facility]:  [address], [place], [place], [address], [phone] [facility]:  [address], [place], [place], [address], [phone] [facility]:  [phone] [facility]:   [phone] [facility]:  [phone]

## 2014-12-29 NOTE — ED Notes (Signed)
Declined W/C at D/C and was escorted to lobby by RN. 

## 2014-12-29 NOTE — ED Provider Notes (Signed)
CSN: [id number]     Arrival date & time 12/29/14  1227 History  This chart was scribed for non-physician practitioner, Francee Piccolo, PA-C, working with Pricilla Loveless, MD by Charline Bills, ED Scribe. This patient was seen in room TR06C/TR06C and the patient's care was started at 1:06 PM.   Chief Complaint  Patient presents with  . Dental Problem   HPI Comments: Patient is a 19 year old male past medical history significant for chronic dental pain. He states he was sent over to the emergency department from the oral surgeon's office to receive pain medication. He is scheduled for oral surgery on July 20. He states he is currently on clindamycin for dental infection. No modifying factors identified.  Patient is a 19 y.o. male presenting with tooth pain. The history is provided by the patient. No language interpreter was used.  Dental Pain Location:  Upper Quality:  Aching, burning and constant Severity:  Moderate Onset quality:  Gradual Duration:  4 days Timing:  Constant Progression:  Worsening Chronicity:  Chronic Context: abscess, dental caries and poor dentition   Context: not recent dental surgery   Relieved by:  Nothing Worsened by:  Cold food/drink, hot food/drink and jaw movement Ineffective treatments:  Acetaminophen and NSAIDs Associated symptoms: gum swelling   Associated symptoms: no fever and no trismus   Risk factors: lack of dental care    HPI Comments: Vincent Green is a 19 y.o. male, with a h/o chronic dental pain, who presents to the Emergency Department complaining of constant upper left dental pain for the past year, worse 3-4 days ago. Pt noticed a dental abscess in the upper left region 3-4 days that he describes as a constant, aching, burning sensation that is exacerbated with chewing on the left, cold and heat. He has tried Tylenol and 800 mg ibuprofen without relief, Clindamycin with improvement of gum swelling. He has been seen in the ED 8 times in 7  months for dental pain. Pt states that he has an upcoming appointment on 01/06/15 for dental extraction. He denies fever.   Past Medical History  Diagnosis Date  . Scoliosis   . PTSD (post-traumatic stress disorder)   . Anxiety   . PTSD (post-traumatic stress disorder)   . Anxiety   . Chronic dental pain   . Seizures     benzo-seizure    Past Surgical History  Procedure Laterality Date  . Skin graft    . Nose surgery    . Choanal atresia repair     No family history on file. History  Substance Use Topics  . Smoking status: Current Every Day Smoker -- 1.00 packs/day    Types: Cigarettes  . Smokeless tobacco: Not on file  . Alcohol Use: No    Review of Systems  Constitutional: Negative for fever.  HENT: Positive for dental problem.   All other systems reviewed and are negative.  Allergies  Review of patient's allergies indicates no known allergies.  Home Medications   Prior to Admission medications   Medication Sig Start Date End Date Taking? Authorizing Provider  albuterol (PROVENTIL HFA;VENTOLIN HFA) 108 (90 BASE) MCG/ACT inhaler Inhale 2 puffs into the lungs every 6 (six) hours as needed for wheezing or shortness of breath.    Historical Provider, MD  ALPRAZolam Prudy Feeler) 1 MG tablet Take 0.5 mg by mouth 2 (two) times daily.    Historical Provider, MD  alprazolam Prudy Feeler) 2 MG tablet Take 2 mg by mouth 2 (two) times daily.  Historical Provider, MD  clindamycin (CLEOCIN) 150 MG capsule Take 3 capsules (450 mg total) by mouth 3 (three) times daily. 10/11/14   Ladona MowJoe Mintz, PA-C  HYDROcodone-acetaminophen (NORCO/VICODIN) 5-325 MG per tablet Take 1 tablet by mouth every 4 (four) hours as needed. 08/13/14   Burgess AmorJulie Idol, PA-C  ibuprofen (ADVIL,MOTRIN) 800 MG tablet Take 1 tablet (800 mg total) by mouth 3 (three) times daily. 12/11/14   Joycie PeekBenjamin Cartner, PA-C  lidocaine (XYLOCAINE) 2 % solution Use as directed 20 mLs in the mouth or throat as needed for mouth pain. 12/29/14   Sade Mehlhoff, PA-C  naproxen (NAPROSYN) 500 MG tablet Take 1 tablet (500 mg total) by mouth 2 (two) times daily. 08/04/14   Ivery QualeHobson Bryant, PA-C  oxyCODONE-acetaminophen (PERCOCET) 5-325 MG per tablet Take 1-2 tablets by mouth every 4 (four) hours as needed. 12/11/14   Joycie PeekBenjamin Cartner, PA-C   BP 105/63 mmHg  Pulse 90  Temp(Src) 98.7 F (37.1 C) (Oral)  Resp 18  Ht 6' (1.829 m)  Wt 135 lb 9 oz (61.491 kg)  BMI 18.38 kg/m2  SpO2 100% Physical Exam  Constitutional: He is oriented to person, place, and time. He appears well-developed and well-nourished. No distress.  HENT:  Head: Normocephalic and atraumatic.  Right Ear: External ear normal.  Left Ear: External ear normal.  Nose: Nose normal.  Mouth/Throat: Uvula is midline and mucous membranes are normal. No trismus in the jaw. Abnormal dentition. Dental caries present. No uvula swelling.  Submental and sublingual spaces are soft.  Eyes: Conjunctivae are normal.  Neck: Normal range of motion. Neck supple.  Cardiovascular: Normal rate.   Pulmonary/Chest: Effort normal.  Neurological: He is alert and oriented to person, place, and time.  Skin: Skin is warm and dry. He is not diaphoretic.  Psychiatric: He has a normal mood and affect.  Nursing note and vitals reviewed.  ED Course  Procedures (including critical care time) Medications - No data to display  DIAGNOSTIC STUDIES: Oxygen Saturation is 100% on RA, normal by my interpretation.    COORDINATION OF CARE: 1:11 PM-Discussed treatment plan with pt at bedside and pt agreed to plan.   Labs Review Labs Reviewed - No data to display  Imaging Review No results found.   EKG Interpretation None      Patient refuses xylocaine.  MDM   Final diagnoses:  Chronic dental pain    Filed Vitals:   12/29/14 1240  BP: 105/63  Pulse: 90  Temp: 98.7 F (37.1 C)  Resp: 18   Afebrile, NAD, non-toxic appearing, AAOx4.   Patient with chronic dental pain. He has been seen in the  emergency department 9 times in the last 6 months. He has seen an Transport planneroral surgeon.  No gross abscess.  Exam unconcerning for Ludwig's angina or spread of infection.  5 stiffness clindamycin prescription. Offered Xylocaine, patient declines.Renae Gloss.  Urged patient to follow-up with oral surgeon at next scheduled visit. Patient is stable at time of discharge    I personally performed the services described in this documentation, which was scribed in my presence. The recorded information has been reviewed and is accurate.     Francee PiccoloJennifer Lelend Heinecke, PA-C 12/29/14 1401  Pricilla LovelessScott Goldston, MD 12/30/14 [phone]

## 2014-12-29 NOTE — ED Notes (Signed)
Per PA Jen Pt refused viscous lidocaine for pain.

## 2015-01-05 ENCOUNTER — Encounter (HOSPITAL_COMMUNITY): Payer: Self-pay | Admitting: Emergency Medicine

## 2015-01-05 ENCOUNTER — Emergency Department (HOSPITAL_COMMUNITY)
Admission: EM | Admit: 2015-01-05 | Discharge: 2015-01-05 | Disposition: A | Discharge status: Home / Self Care | Payer: Medicaid Other | Attending: Emergency Medicine | Admitting: Emergency Medicine

## 2015-01-05 DIAGNOSIS — Z792 Long term (current) use of antibiotics: Secondary | ICD-10-CM | POA: Diagnosis not present

## 2015-01-05 DIAGNOSIS — G8929 Other chronic pain: Secondary | ICD-10-CM | POA: Insufficient documentation

## 2015-01-05 DIAGNOSIS — Z79899 Other long term (current) drug therapy: Secondary | ICD-10-CM | POA: Diagnosis not present

## 2015-01-05 DIAGNOSIS — K088 Other specified disorders of teeth and supporting structures: Secondary | ICD-10-CM | POA: Diagnosis present

## 2015-01-05 DIAGNOSIS — M419 Scoliosis, unspecified: Secondary | ICD-10-CM | POA: Diagnosis not present

## 2015-01-05 DIAGNOSIS — Z720 Tobacco use: Secondary | ICD-10-CM | POA: Insufficient documentation

## 2015-01-05 DIAGNOSIS — F419 Anxiety disorder, unspecified: Secondary | ICD-10-CM | POA: Diagnosis not present

## 2015-01-05 DIAGNOSIS — K029 Dental caries, unspecified: Secondary | ICD-10-CM

## 2015-01-05 MED ORDER — ONDANSETRON 8 MG PO TBDP
8.0000 mg | ORAL_TABLET | Freq: Once | ORAL | Status: AC
  Administered 2015-01-05: 8 mg via ORAL
  Filled 2015-01-05: qty 1

## 2015-01-05 MED ORDER — PROMETHAZINE HCL 25 MG PO TABS: 25.0000 mg | ORAL_TABLET | Freq: Four times a day (QID) | ORAL | Status: DC | PRN

## 2015-01-05 MED ORDER — OXYCODONE-ACETAMINOPHEN 5-325 MG PO TABS
2.0000 | ORAL_TABLET | Freq: Once | ORAL | Status: AC
  Administered 2015-01-05: 2 via ORAL
  Filled 2015-01-05: qty 2

## 2015-01-05 NOTE — Discharge Instructions (Signed)
Medication for nausea. Recommend ibuprofen 800 mg 3 times a day and/or Tylenol 2 extra strength every 4-6 hours.

## 2015-01-05 NOTE — ED Provider Notes (Signed)
CSN: [id number]     Arrival date & time 01/05/15  1925 History   First MD Initiated Contact with Patient 01/05/15 1933     Chief Complaint  Patient presents with  . Dental Problem     (Consider location/radiation/quality/duration/timing/severity/associated sxs/prior Treatment) HPI.... Patient presents with left upper anterior tooth pain/caries. He is allegedly scheduled for oral surgery in [facility] after his abscess clears up. He is presently taking clindamycin for same. No fever, chills, stiff neck. Severity is moderate. He is taking small amounts of Tylenol and ibuprofen at home.  Past Medical History  Diagnosis Date  . Scoliosis   . PTSD (post-traumatic stress disorder)   . Anxiety   . PTSD (post-traumatic stress disorder)   . Anxiety   . Chronic dental pain   . Seizures     benzo-seizure    Past Surgical History  Procedure Laterality Date  . Skin graft    . Nose surgery    . Choanal atresia repair     History reviewed. No pertinent family history. History  Substance Use Topics  . Smoking status: Current Every Day Smoker -- 1.00 packs/day    Types: Cigarettes  . Smokeless tobacco: Not on file  . Alcohol Use: No    Review of Systems  All other systems reviewed and are negative.     Allergies  Review of patient's allergies indicates no known allergies.  Home Medications   Prior to Admission medications   Medication Sig Start Date End Date Taking? Authorizing Provider  albuterol (PROVENTIL HFA;VENTOLIN HFA) 108 (90 BASE) MCG/ACT inhaler Inhale 2 puffs into the lungs every 6 (six) hours as needed for wheezing or shortness of breath.    Historical Provider, MD  ALPRAZolam Prudy Feeler) 1 MG tablet Take 0.5 mg by mouth 2 (two) times daily.    Historical Provider, MD  alprazolam Prudy Feeler) 2 MG tablet Take 2 mg by mouth 2 (two) times daily.     Historical Provider, MD  clindamycin (CLEOCIN) 150 MG capsule Take 3 capsules (450 mg total) by mouth 3 (three) times daily.  10/11/14   Ladona Mow, PA-C  HYDROcodone-acetaminophen (NORCO/VICODIN) 5-325 MG per tablet Take 1 tablet by mouth every 4 (four) hours as needed. 08/13/14   Burgess Amor, PA-C  ibuprofen (ADVIL,MOTRIN) 800 MG tablet Take 1 tablet (800 mg total) by mouth 3 (three) times daily. 12/11/14   Joycie Peek, PA-C  lidocaine (XYLOCAINE) 2 % solution Use as directed 20 mLs in the mouth or throat as needed for mouth pain. 12/29/14   Jennifer Piepenbrink, PA-C  naproxen (NAPROSYN) 500 MG tablet Take 1 tablet (500 mg total) by mouth 2 (two) times daily. 08/04/14   Ivery Quale, PA-C  oxyCODONE-acetaminophen (PERCOCET) 5-325 MG per tablet Take 1-2 tablets by mouth every 4 (four) hours as needed. 12/11/14   Joycie Peek, PA-C  promethazine (PHENERGAN) 25 MG tablet Take 1 tablet (25 mg total) by mouth every 6 (six) hours as needed. 01/05/15   [name], MD   BP 123/57 mmHg  Pulse 126  Temp(Src) 98.9 F (37.2 C) (Oral)  Resp 20  Ht 6' (1.829 m)  Wt 140 lb (63.504 kg)  BMI 18.98 kg/m2  SpO2 100% Physical Exam  Constitutional: He is oriented to person, place, and time. He appears well-developed and well-nourished.  HENT:  Obvious caries throughout entire mouth. Minimal tenderness in area of history of present illness  Eyes: Conjunctivae and EOM are normal. Pupils are equal, round, and reactive to light.  Neck: Normal  range of motion. Neck supple.  Cardiovascular: Normal rate and regular rhythm.   Pulmonary/Chest: Effort normal and breath sounds normal.  Abdominal: Soft. Bowel sounds are normal.  Musculoskeletal: Normal range of motion.  Neurological: He is alert and oriented to person, place, and time.  Skin: Skin is warm and dry.  Psychiatric: He has a normal mood and affect. His behavior is normal.  Nursing note and vitals reviewed.   ED Course  Procedures (including critical care time) Labs Review Labs Reviewed - No data to display  Imaging Review No results found.   EKG  Interpretation None      MDM   Final diagnoses:  Tooth caries    I recommended continuation of clindamycin. 2 Percocet tablets given. I suggested to two XS Tylenol every 4-6 hours and ibuprofen 800 mg 3 times a day for pain    Donnetta HutchingBrian Quantavia Frith, MD 01/07/15 [phone]

## 2015-01-05 NOTE — ED Notes (Signed)
Patient complaining of dental abscess (left upper). Reports is scheduled to have teeth pulled. Patient also complains of headache and states he hasn't been able to drink or eat much due to dental pain.

## 2015-03-11 ENCOUNTER — Encounter (HOSPITAL_COMMUNITY): Payer: Self-pay | Admitting: *Deleted

## 2015-03-11 ENCOUNTER — Emergency Department (HOSPITAL_COMMUNITY)
Admission: EM | Admit: 2015-03-11 | Discharge: 2015-03-11 | Disposition: A | Discharge status: Home / Self Care | Payer: Medicaid Other | Attending: Emergency Medicine | Admitting: Emergency Medicine

## 2015-03-11 DIAGNOSIS — Z79899 Other long term (current) drug therapy: Secondary | ICD-10-CM | POA: Diagnosis not present

## 2015-03-11 DIAGNOSIS — G8929 Other chronic pain: Secondary | ICD-10-CM | POA: Insufficient documentation

## 2015-03-11 DIAGNOSIS — R1110 Vomiting, unspecified: Secondary | ICD-10-CM | POA: Diagnosis not present

## 2015-03-11 DIAGNOSIS — K088 Other specified disorders of teeth and supporting structures: Secondary | ICD-10-CM | POA: Diagnosis not present

## 2015-03-11 DIAGNOSIS — Z720 Tobacco use: Secondary | ICD-10-CM | POA: Insufficient documentation

## 2015-03-11 DIAGNOSIS — Z8659 Personal history of other mental and behavioral disorders: Secondary | ICD-10-CM | POA: Insufficient documentation

## 2015-03-11 DIAGNOSIS — K029 Dental caries, unspecified: Secondary | ICD-10-CM | POA: Insufficient documentation

## 2015-03-11 DIAGNOSIS — K0889 Other specified disorders of teeth and supporting structures: Secondary | ICD-10-CM

## 2015-03-11 MED ORDER — CLINDAMYCIN HCL 150 MG PO CAPS
300.0000 mg | ORAL_CAPSULE | Freq: Once | ORAL | Status: AC
  Administered 2015-03-11: 300 mg via ORAL
  Filled 2015-03-11: qty 2

## 2015-03-11 MED ORDER — CLINDAMYCIN HCL 150 MG PO CAPS: 300.0000 mg | ORAL_CAPSULE | Freq: Four times a day (QID) | ORAL | Status: DC

## 2015-03-11 MED ORDER — OXYCODONE-ACETAMINOPHEN 5-325 MG PO TABS: 1.0000 | ORAL_TABLET | ORAL | Status: DC | PRN

## 2015-03-11 NOTE — Discharge Instructions (Signed)
Dental Pain  Toothache is pain in or around a tooth. It may get worse with chewing or with cold or heat.   HOME CARE  · Your dentist may use a numbing medicine during treatment. If so, you may need to avoid eating until the medicine wears off. Ask your dentist about this.  · Only take medicine as told by your dentist or doctor.  · Avoid chewing food near the painful tooth until after all treatment is done. Ask your dentist about this.  GET HELP RIGHT AWAY IF:   · The problem gets worse or new problems appear.  · You have a fever.  · There is redness and puffiness (swelling) of the face, jaw, or neck.  · You cannot open your mouth.  · There is pain in the jaw.  · There is very bad pain that is not helped by medicine.  MAKE SURE YOU:   · Understand these instructions.  · Will watch your condition.  · Will get help right away if you are not doing well or get worse.  Document Released: 11/22/2007 Document Revised: 08/28/2011 Document Reviewed: 11/22/2007  ExitCare® Patient Information ©2015 [employer]. This information is not intended to replace advice given to you by your health care provider. Make sure you discuss any questions you have with your health care provider.

## 2015-03-11 NOTE — ED Notes (Signed)
Pt comes in with dental abscess on right side of face. Pt has had abscess 2 months ago and was given antibiotics, pt took to completion and was doing much better. A week ago the abscess got bigger and his pain increased. Pt just moved from [place] and can't get back to see the doctor who previously tx him.

## 2015-03-11 NOTE — ED Provider Notes (Signed)
CSN: [id number]     Arrival date & time 03/11/15  1428 History   First MD Initiated Contact with Patient 03/11/15 1515     Chief Complaint  Patient presents with  . Dental Pain     (Consider location/radiation/quality/duration/timing/severity/associated sxs/prior Treatment) HPI   Vincent Green is a 19 y.o. male who presents to the Emergency Department complaining of dental pain and abscess to the left upper jaw.  He states that he has multiple dental caries and had a similar episode 2 months ago, took clindamycin and the symptoms improved.  One week ago he noticed "pus pockets" on his gum and pain to his face with swelling of the gums.  Reports having a fever this morning.  Pain radiates to his left ear.  Pain is worse with exposure to hot and cold and chewing.  States he recently moved and can no longer afford to see his dentist in [place].  He denies neck pain, vomiting, difficulty swallowing or breathing.     Past Medical History  Diagnosis Date  . Scoliosis   . PTSD (post-traumatic stress disorder)   . Anxiety   . PTSD (post-traumatic stress disorder)   . Anxiety   . Chronic dental pain   . Seizures     benzo-seizure    Past Surgical History  Procedure Laterality Date  . Skin graft    . Nose surgery    . Choanal atresia repair     No family history on file. Social History  Substance Use Topics  . Smoking status: Current Every Day Smoker -- 1.00 packs/day    Types: Cigarettes  . Smokeless tobacco: None  . Alcohol Use: No    Review of Systems  Constitutional: Negative for fever and appetite change.  HENT: Positive for dental problem and facial swelling. Negative for congestion, sore throat and trouble swallowing.   Eyes: Negative for pain and visual disturbance.  Respiratory: Negative for shortness of breath.   Gastrointestinal: Positive for vomiting.  Musculoskeletal: Negative for neck pain and neck stiffness.  Neurological: Negative for dizziness, facial  asymmetry and headaches.  Hematological: Negative for adenopathy.  All other systems reviewed and are negative.     Allergies  Review of patient's allergies indicates no known allergies.  Home Medications   Prior to Admission medications   Medication Sig Start Date End Date Taking? Authorizing Provider  acetaminophen (TYLENOL) 500 MG tablet Take 500 mg by mouth every 6 (six) hours as needed.   Yes Historical Provider, MD  albuterol (PROVENTIL HFA;VENTOLIN HFA) 108 (90 BASE) MCG/ACT inhaler Inhale 2 puffs into the lungs every 6 (six) hours as needed for wheezing or shortness of breath.   Yes Historical Provider, MD  ibuprofen (ADVIL,MOTRIN) 200 MG tablet Take 200 mg by mouth every 6 (six) hours as needed for mild pain or moderate pain.   Yes Historical Provider, MD  naproxen sodium (EQ NAPROXEN SODIUM) 220 MG tablet Take 220-440 mg by mouth daily as needed (FOR PAIN).   Yes Historical Provider, MD   BP 130/80 mmHg  Pulse 117  Temp(Src) 98.3 F (36.8 C) (Oral)  Resp 14  SpO2 100% Physical Exam  Constitutional: He is oriented to person, place, and time. He appears well-developed and well-nourished. No distress.  HENT:  Head: Normocephalic and atraumatic.  Right Ear: Tympanic membrane and ear canal normal.  Left Ear: Tympanic membrane and ear canal normal.  Mouth/Throat: Uvula is midline, oropharynx is clear and moist and mucous membranes are normal. No trismus in  the jaw. Dental caries present. No dental abscesses or uvula swelling.  Tenderness to palpation and dental caries of the upper left second premolar.  Two small pustules to the adjacent gums.  Mild left facial swelling, no obvious dental abscess, trismus, or sublingual abnml.    Neck: Normal range of motion. Neck supple.  Cardiovascular: Normal rate, regular rhythm and normal heart sounds.   No murmur heard. Pulmonary/Chest: Effort normal and breath sounds normal.  Musculoskeletal: Normal range of motion.  Lymphadenopathy:     He has no cervical adenopathy.  Neurological: He is alert and oriented to person, place, and time. He exhibits normal muscle tone. Coordination normal.  Skin: Skin is warm and dry.  Nursing note and vitals reviewed.   ED Course  Procedures (including critical care time)   MDM   Final diagnoses:  Pain, dental     Patient with widespread dental decay. No concerning symptoms for Ludwig's angina.  Referral information given for local dentistry.  Airway is patent. Vital signs are stable. Patient is nontoxic-appearing.  Reviewed on the [place] narcotics database no prescriptions filed since July.  Vitals reviewed, pt stable for d/c    Pauline Aus, PA-C 03/11/15 1640  Vanetta Mulders, MD 03/18/15 0710

## 2015-04-05 ENCOUNTER — Emergency Department (HOSPITAL_COMMUNITY)
Admission: EM | Admit: 2015-04-05 | Discharge: 2015-04-05 | Disposition: A | Discharge status: Home / Self Care | Payer: Medicaid Other | Attending: Emergency Medicine | Admitting: Emergency Medicine

## 2015-04-05 ENCOUNTER — Encounter (HOSPITAL_COMMUNITY): Payer: Self-pay | Admitting: Emergency Medicine

## 2015-04-05 DIAGNOSIS — K0889 Other specified disorders of teeth and supporting structures: Secondary | ICD-10-CM | POA: Diagnosis present

## 2015-04-05 DIAGNOSIS — Z79899 Other long term (current) drug therapy: Secondary | ICD-10-CM | POA: Insufficient documentation

## 2015-04-05 DIAGNOSIS — K029 Dental caries, unspecified: Secondary | ICD-10-CM | POA: Insufficient documentation

## 2015-04-05 DIAGNOSIS — M419 Scoliosis, unspecified: Secondary | ICD-10-CM | POA: Diagnosis not present

## 2015-04-05 DIAGNOSIS — R000 Tachycardia, unspecified: Secondary | ICD-10-CM | POA: Insufficient documentation

## 2015-04-05 DIAGNOSIS — Z720 Tobacco use: Secondary | ICD-10-CM | POA: Insufficient documentation

## 2015-04-05 DIAGNOSIS — Z8659 Personal history of other mental and behavioral disorders: Secondary | ICD-10-CM | POA: Diagnosis not present

## 2015-04-05 DIAGNOSIS — Z792 Long term (current) use of antibiotics: Secondary | ICD-10-CM | POA: Diagnosis not present

## 2015-04-05 DIAGNOSIS — G8929 Other chronic pain: Secondary | ICD-10-CM | POA: Diagnosis not present

## 2015-04-05 DIAGNOSIS — K047 Periapical abscess without sinus: Secondary | ICD-10-CM | POA: Insufficient documentation

## 2015-04-05 MED ORDER — ACETAMINOPHEN-CODEINE #3 300-30 MG PO TABS
2.0000 | ORAL_TABLET | Freq: Once | ORAL | Status: AC
  Administered 2015-04-05: 2 via ORAL
  Filled 2015-04-05: qty 2

## 2015-04-05 MED ORDER — PENICILLIN V POTASSIUM 500 MG PO TABS: 500.0000 mg | ORAL_TABLET | Freq: Three times a day (TID) | ORAL | Status: DC

## 2015-04-05 MED ORDER — IBUPROFEN 800 MG PO TABS: 800.0000 mg | ORAL_TABLET | Freq: Three times a day (TID) | ORAL | Status: DC

## 2015-04-05 MED ORDER — PENICILLIN V POTASSIUM 250 MG PO TABS
500.0000 mg | ORAL_TABLET | Freq: Once | ORAL | Status: AC
  Administered 2015-04-05: 500 mg via ORAL
  Filled 2015-04-05: qty 2

## 2015-04-05 NOTE — ED Provider Notes (Signed)
CSN: [id number]     Arrival date & time 04/05/15  1643 History  By signing my name below, I, Tanda Rockers, attest that this documentation has been prepared under the direction and in the presence of Ivery Quale, PA-C.  Electronically Signed: Tanda Rockers, ED Scribe. 04/05/2015. 5:25 PM.  Chief Complaint  Patient presents with  . Dental Pain   The history is provided by the patient. No language interpreter was used.     HPI Comments: Vincent Green is a 19 y.o. male with hx recurrent dental abscess who presents to the Emergency Department complaining of left upper dental pain and abscess x 4-5 days. Pt was seen in the ED on 03/11/2015 (approximately 1 month ago) for similar pain. He was placed on Clindamycin at that time without relief. Pt states he was referred to a dentist in [place], [place] but that he could not get there with his father's car. He has an appointment on Wednesday, 04/07/2015 with [facility]. Denies fever, chills, or any other associated symptoms. Pt has been on some sort of antibiotic since January for same dental pain.     Past Medical History  Diagnosis Date  . Scoliosis   . PTSD (post-traumatic stress disorder)   . Anxiety   . PTSD (post-traumatic stress disorder)   . Anxiety   . Chronic dental pain   . Seizures (HCC)     benzo-seizure    Past Surgical History  Procedure Laterality Date  . Skin graft    . Nose surgery    . Choanal atresia repair     No family history on file. Social History  Substance Use Topics  . Smoking status: Current Every Day Smoker -- 1.00 packs/day    Types: Cigarettes  . Smokeless tobacco: None  . Alcohol Use: No    Review of Systems  Constitutional: Negative for fever and chills.  HENT: Positive for dental problem.   All other systems reviewed and are negative.  Allergies  Review of patient's allergies indicates no known allergies.  Home Medications   Prior to Admission medications   Medication Sig Start  Date End Date Taking? Authorizing Provider  acetaminophen (TYLENOL) 500 MG tablet Take 500 mg by mouth every 6 (six) hours as needed.    Historical Provider, MD  albuterol (PROVENTIL HFA;VENTOLIN HFA) 108 (90 BASE) MCG/ACT inhaler Inhale 2 puffs into the lungs every 6 (six) hours as needed for wheezing or shortness of breath.    Historical Provider, MD  clindamycin (CLEOCIN) 150 MG capsule Take 2 capsules (300 mg total) by mouth 4 (four) times daily. 03/11/15   Tammy Triplett, PA-C  ibuprofen (ADVIL,MOTRIN) 200 MG tablet Take 200 mg by mouth every 6 (six) hours as needed for mild pain or moderate pain.    Historical Provider, MD  naproxen sodium (EQ NAPROXEN SODIUM) 220 MG tablet Take 220-440 mg by mouth daily as needed (FOR PAIN).    Historical Provider, MD  oxyCODONE-acetaminophen (PERCOCET/ROXICET) 5-325 MG per tablet Take 1 tablet by mouth every 4 (four) hours as needed. 03/11/15   Tammy Triplett, PA-C   Triage Vitals: BP 110/61 mmHg  Pulse 92  Temp(Src) 98.1 F (36.7 C) (Oral)  Resp 20  Ht 6' (1.829 m)  Wt 150 lb (68.04 kg)  BMI 20.34 kg/m2  SpO2 100%   Physical Exam  Constitutional: He is oriented to person, place, and time. He appears well-developed and well-nourished. No distress.  HENT:  Head: Normocephalic and atraumatic.  Airway is patent No  swelling noted to the tongue Submental area is soft No temperature changes of the face Multiple dental carries of the upper and lower gum There does appear to be a very small abscess of the upper gum  Eyes: Conjunctivae and EOM are normal.  Neck: Neck supple. No tracheal deviation present.  No palpable cervical lymphadenopathy FROM of the neck.  Cardiovascular: Regular rhythm.  Tachycardia present.   Rate of 104  Pulmonary/Chest: Effort normal and breath sounds normal. No respiratory distress. He has no wheezes. He has no rales.  Musculoskeletal: Normal range of motion.  Neurological: He is alert and oriented to person, place, and  time.  Skin: Skin is warm and dry.  Psychiatric: He has a normal mood and affect. His behavior is normal.  Nursing note and vitals reviewed.   ED Course  Procedures (including critical care time)  DIAGNOSTIC STUDIES: Oxygen Saturation is 100% on RA, normal by my interpretation.    COORDINATION OF CARE: 5:18 PM-Discussed treatment plan which includes consulting with attending physician with pt at bedside and pt agreed to plan.   5:20 PM - Spoke with attending physician, Dr. Verdie MosherLiu. Plan includes rx amoxicillin and ibuprofen. Will refer pt back to dentist.   Labs Review Labs Reviewed - No data to display  Imaging Review No results found.   EKG Interpretation None      MDM  Pt has dental caries, but no evidence for Ludwig's Angina. Vital signs stable.  Rx for amoxil and ibuprofen given to the patient. Pt strongly encouraged to see a dentist as soon as possible.   Final diagnoses:  Dental caries    **I personally performed the services described in this documentation, which was scribed in my presence. The recorded information has been reviewed and is accurate.Ivery Quale*     Nashton Belson, PA-C 04/06/15 2146  Lavera Guiseana Duo Liu, MD 04/07/15 [phone]

## 2015-04-05 NOTE — Discharge Instructions (Signed)
Please see your dentist as suggested. Please use penicillin 3 times daily along with ibuprofen 800 mg and 500 mg of Tylenol 3 times daily. Please see your , medicaid  Dental Caries Dental caries is tooth decay. This decay can cause a hole in teeth (cavity) that can get bigger and deeper over time. HOME CARE  Brush and floss your teeth. Do this at least two times a day.  Use a fluoride toothpaste.  Use a mouth rinse if told by your dentist or doctor.  Eat less sugary and starchy foods. Drink less sugary drinks.  Avoid snacking often on sugary and starchy foods. Avoid sipping often on sugary drinks.  Keep regular checkups and cleanings with your dentist.  Use fluoride supplements if told by your dentist or doctor.  Allow fluoride to be applied to teeth if told by your dentist or doctor.   This information is not intended to replace advice given to you by your health care provider. Make sure you discuss any questions you have with your health care provider.   Document Released: 03/14/2008 Document Revised: 06/26/2014 Document Reviewed: 06/07/2012 Elsevier Interactive Patient Education 2016 [employer]  access physician for additional pain management. Will be a a is a target heart nail is able to so he is doing Allergies he is in a is is not exactly so used to was in her so thousand and walking without is to use her

## 2015-04-05 NOTE — ED Notes (Signed)
Pt c/o of severe pain to LT upper jaw x 4-5 days. Pt states he recently finished clindamycin for abscess. Pt states he is unable to get a dental appointment until Wednesday.

## 2015-05-11 ENCOUNTER — Encounter (HOSPITAL_COMMUNITY): Payer: Self-pay | Admitting: Emergency Medicine

## 2015-05-11 ENCOUNTER — Emergency Department (HOSPITAL_COMMUNITY)
Admission: EM | Admit: 2015-05-11 | Discharge: 2015-05-11 | Disposition: A | Discharge status: Home / Self Care | Payer: Medicaid Other | Attending: Emergency Medicine | Admitting: Emergency Medicine

## 2015-05-11 DIAGNOSIS — F419 Anxiety disorder, unspecified: Secondary | ICD-10-CM | POA: Diagnosis not present

## 2015-05-11 DIAGNOSIS — Z79899 Other long term (current) drug therapy: Secondary | ICD-10-CM | POA: Diagnosis not present

## 2015-05-11 DIAGNOSIS — K029 Dental caries, unspecified: Secondary | ICD-10-CM | POA: Diagnosis not present

## 2015-05-11 DIAGNOSIS — Z792 Long term (current) use of antibiotics: Secondary | ICD-10-CM | POA: Diagnosis not present

## 2015-05-11 DIAGNOSIS — F17210 Nicotine dependence, cigarettes, uncomplicated: Secondary | ICD-10-CM | POA: Insufficient documentation

## 2015-05-11 DIAGNOSIS — M419 Scoliosis, unspecified: Secondary | ICD-10-CM | POA: Diagnosis not present

## 2015-05-11 DIAGNOSIS — K0889 Other specified disorders of teeth and supporting structures: Secondary | ICD-10-CM | POA: Diagnosis present

## 2015-05-11 DIAGNOSIS — G8929 Other chronic pain: Secondary | ICD-10-CM | POA: Insufficient documentation

## 2015-05-11 MED ORDER — IBUPROFEN 600 MG PO TABS
600.0000 mg | ORAL_TABLET | Freq: Four times a day (QID) | ORAL | Status: DC | PRN
Start: 2015-05-11 — End: 2015-08-31

## 2015-05-11 MED ORDER — OXYCODONE-ACETAMINOPHEN 5-325 MG PO TABS
1.0000 | ORAL_TABLET | Freq: Once | ORAL | Status: AC
  Administered 2015-05-11: 1 via ORAL
  Filled 2015-05-11: qty 1

## 2015-05-11 MED ORDER — OXYCODONE-ACETAMINOPHEN 5-325 MG PO TABS: 1.0000 | ORAL_TABLET | Freq: Four times a day (QID) | ORAL | Status: DC | PRN

## 2015-05-11 NOTE — ED Provider Notes (Signed)
CSN: [id number]     Arrival date & time 05/11/15  1131 History  By signing my name below, I, Vincent Green, attest that this documentation has been prepared under the direction and in the presence of Vincent Pel, PA-C. Electronically Signed: Ronney Green, ED Scribe. 05/11/2015. 1:23 PM.    Chief Complaint  Patient presents with  . Dental Pain   The history is provided by the patient. No language interpreter was used.    HPI Comments: Vincent Green is a 19 y.o. male with a history of chronic dental pain, who presents to the Emergency Department complaining of chronic, constant, severe upper dental pain, with the most pain in his left upper teeth, that has been ongoing for several months. He has hx of PTSD, anxiety and seizures.  He states he is being cared for by a dentist and is supposed to return for a dental appointment in December, at which point he is supposed to have a full-mouth XR and receive a referral to an oral Careers adviser. Patient had been given 20 tablets of Rx amoxicillin but states that did not alleviate his pain, even after finishing the course of antibiotics 4 days ago. Patient states he has not been able to eat, drink, or sleep secondary to his pain. He states at one point when he attempted to eat, a piece of his tooth had broken off. Patient had been taking oxycodone, hydrocodone, and ibuprofen for his pain over the past year, prescribed by his dentist Vincent Green. He denies fever, vomiting, or any other symptoms.   Past Medical History  Diagnosis Date  . Scoliosis   . PTSD (post-traumatic stress disorder)   . Anxiety   . PTSD (post-traumatic stress disorder)   . Anxiety   . Chronic dental pain   . Seizures (HCC)     benzo-seizure    Past Surgical History  Procedure Laterality Date  . Skin graft    . Nose surgery    . Choanal atresia repair     No family history on file. Social History  Substance Use Topics  . Smoking status: Current Every Day Smoker -- 1.00 packs/day    Types: Cigarettes  . Smokeless tobacco: None  . Alcohol Use: No    Review of Systems A complete 10 system review of systems was obtained and all systems are negative except as noted in the HPI and PMH.    Allergies  Review of patient's allergies indicates no known allergies.  Home Medications   Prior to Admission medications   Medication Sig Start Date End Date Taking? Authorizing Provider  acetaminophen (TYLENOL) 500 MG tablet Take 500 mg by mouth every 6 (six) hours as needed.    Historical Provider, MD  albuterol (PROVENTIL HFA;VENTOLIN HFA) 108 (90 BASE) MCG/ACT inhaler Inhale 2 puffs into the lungs every 6 (six) hours as needed for wheezing or shortness of breath.    Historical Provider, MD  clindamycin (CLEOCIN) 150 MG capsule Take 2 capsules (300 mg total) by mouth 4 (four) times daily. Patient not taking: Reported on 04/05/2015 03/11/15   Vincent Triplett, PA-C  ibuprofen (ADVIL,MOTRIN) 600 MG tablet Take 1 tablet (600 mg total) by mouth every 6 (six) hours as needed. 05/11/15   Vincent Harkey Neva Seat, PA-C  ibuprofen (ADVIL,MOTRIN) 800 MG tablet Take 1 tablet (800 mg total) by mouth 3 (three) times daily. 04/05/15   Vincent Quale, PA-C  oxyCODONE-acetaminophen (PERCOCET/ROXICET) 5-325 MG per tablet Take 1 tablet by mouth every 4 (four) hours as needed. Patient not  taking: Reported on 04/05/2015 03/11/15   Vincent Triplett, PA-C  oxyCODONE-acetaminophen (PERCOCET/ROXICET) 5-325 MG tablet Take 1-2 tablets by mouth every 6 (six) hours as needed. 05/11/15   Vincent Peliffany Sian Joles, PA-C  penicillin v potassium (VEETID) 500 MG tablet Take 1 tablet (500 mg total) by mouth 3 (three) times daily. 04/05/15   Vincent QualeHobson Bryant, PA-C   BP 125/76 mmHg  Pulse 120  Temp(Src) 98.4 F (36.9 C) (Oral)  Resp 22  SpO2 100% Physical Exam  Constitutional: He is oriented to person, place, and time. He appears well-developed and well-nourished. No distress.  HENT:  Head: Normocephalic and atraumatic.  Severe widespread  dental decay. All teeth are broken and crumble when I touch them. Roots are exposed to the top left tooth. Pt in noticeable pain.  Eyes: Conjunctivae and EOM are normal.  Neck: Neck supple. No tracheal deviation present.  Cardiovascular: Normal rate.   Pulmonary/Chest: Effort normal. No respiratory distress.  Musculoskeletal: Normal range of motion.  Neurological: He is alert and oriented to person, place, and time.  Skin: Skin is warm and dry.  Psychiatric: His behavior is normal. His mood appears anxious.  Nursing note and vitals reviewed.   ED Course  Procedures (including critical care time)  DIAGNOSTIC STUDIES: Oxygen Saturation is 100% on RA, normal by my interpretation.    COORDINATION OF CARE: 12:50 PM - Discussed treatment plan with pt at bedside which includes Rx amoxicillin and pain-relieving medications. Advised pt to use a straw to drink room-temperature water and nutrition drinks. Pt verbalized understanding and agreed to plan.   MDM   Final diagnoses:  Dental decay      Medications  oxyCODONE-acetaminophen (PERCOCET/ROXICET) 5-325 MG per tablet 1 tablet (1 tablet Oral Given 05/11/15 1227)    19 y.o.Vincent Green's evaluation in the Emergency Department is complete.  We have discussed signs and symptoms that warrant return to the ED, such as changes or worsening in symptoms. No emergent s/sx's present. Patent airway. No trismus.  No neck tenderness or protrusion of tongue or floor of mouth. Patient will be given an rx for Amoxicillin and Vicodin (10# tabs). He will be having xrays and all of the upper teeth removed from his upper gumline in December. Pt is tachycardic but he is clearly in discomfort, reviewing previous charts he has had pulse in the 120's-130's before when is significant pain. Discussed diet and drinking through a straw to help with eating and to stay hydrated with room temperature.  Vital signs are stable at discharge. Filed Vitals:   05/11/15  1134  BP: 125/76  Pulse: 120  Temp: 98.4 F (36.9 C)  Resp: 22    Patient/guardian has voiced understanding and agreed to follow-up with the PCP or specialist.      Vincent Peliffany Sitlali Koerner, PA-C 05/11/15 1323  Melene Planan Floyd, DO 05/11/15 1540

## 2015-05-11 NOTE — Discharge Instructions (Signed)

## 2015-05-11 NOTE — ED Notes (Signed)
Pt has had extensive dental problems over the past several weeks. Has been being cared for by a dentist and is supposed to return for appointment in December. Pt here for dental pain. Pt is tachycardic, pain is 9/10.

## 2015-05-15 ENCOUNTER — Emergency Department (HOSPITAL_COMMUNITY)
Admission: EM | Admit: 2015-05-15 | Discharge: 2015-05-15 | Disposition: A | Discharge status: Home / Self Care | Payer: Medicaid Other | Attending: Emergency Medicine | Admitting: Emergency Medicine

## 2015-05-15 ENCOUNTER — Encounter (HOSPITAL_COMMUNITY): Payer: Self-pay

## 2015-05-15 DIAGNOSIS — M419 Scoliosis, unspecified: Secondary | ICD-10-CM | POA: Diagnosis not present

## 2015-05-15 DIAGNOSIS — K0381 Cracked tooth: Secondary | ICD-10-CM | POA: Insufficient documentation

## 2015-05-15 DIAGNOSIS — Z792 Long term (current) use of antibiotics: Secondary | ICD-10-CM | POA: Diagnosis not present

## 2015-05-15 DIAGNOSIS — K029 Dental caries, unspecified: Secondary | ICD-10-CM | POA: Diagnosis not present

## 2015-05-15 DIAGNOSIS — K0889 Other specified disorders of teeth and supporting structures: Secondary | ICD-10-CM | POA: Diagnosis present

## 2015-05-15 DIAGNOSIS — F17210 Nicotine dependence, cigarettes, uncomplicated: Secondary | ICD-10-CM | POA: Insufficient documentation

## 2015-05-15 DIAGNOSIS — Z791 Long term (current) use of non-steroidal anti-inflammatories (NSAID): Secondary | ICD-10-CM | POA: Diagnosis not present

## 2015-05-15 DIAGNOSIS — Z79899 Other long term (current) drug therapy: Secondary | ICD-10-CM | POA: Diagnosis not present

## 2015-05-15 DIAGNOSIS — G8929 Other chronic pain: Secondary | ICD-10-CM | POA: Diagnosis not present

## 2015-05-15 DIAGNOSIS — K047 Periapical abscess without sinus: Secondary | ICD-10-CM | POA: Insufficient documentation

## 2015-05-15 DIAGNOSIS — Z8659 Personal history of other mental and behavioral disorders: Secondary | ICD-10-CM | POA: Diagnosis not present

## 2015-05-15 DIAGNOSIS — K0510 Chronic gingivitis, plaque induced: Secondary | ICD-10-CM | POA: Diagnosis not present

## 2015-05-15 MED ORDER — HYDROCODONE-ACETAMINOPHEN 5-325 MG PO TABS
1.0000 | ORAL_TABLET | Freq: Once | ORAL | Status: AC
  Administered 2015-05-15: 1 via ORAL
  Filled 2015-05-15: qty 1

## 2015-05-15 MED ORDER — AMOXICILLIN 500 MG PO CAPS: 500.0000 mg | ORAL_CAPSULE | Freq: Three times a day (TID) | ORAL | Status: AC

## 2015-05-15 MED ORDER — AMOXICILLIN 250 MG PO CAPS
500.0000 mg | ORAL_CAPSULE | Freq: Once | ORAL | Status: AC
  Administered 2015-05-15: 500 mg via ORAL
  Filled 2015-05-15: qty 2

## 2015-05-15 MED ORDER — HYDROCODONE-ACETAMINOPHEN 5-325 MG PO TABS: 1.0000 | ORAL_TABLET | ORAL | Status: DC | PRN

## 2015-05-15 NOTE — ED Notes (Signed)
Patient c/o of  left upper dental pain which extends to his left jaw. States he has dental caries and has an appt with an oral surgeon Dec 16.

## 2015-05-15 NOTE — Discharge Instructions (Signed)
Dental Abscess °A dental abscess is a collection of pus in or around a tooth. °CAUSES °This condition is caused by a bacterial infection around the root of the tooth that involves the inner part of the tooth (pulp). It may result from: °· Severe tooth decay. °· Trauma to the tooth that allows bacteria to enter into the pulp, such as a broken or chipped tooth. °· Severe gum disease around a tooth. °SYMPTOMS °Symptoms of this condition include: °· Severe pain in and around the infected tooth. °· Swelling and redness around the infected tooth, in the mouth, or in the face. °· Tenderness. °· Pus drainage. °· Bad breath. °· Bitter taste in the mouth. °· Difficulty swallowing. °· Difficulty opening the mouth. °· Nausea. °· Vomiting. °· Chills. °· Swollen neck glands. °· Fever. °DIAGNOSIS °This condition is diagnosed with examination of the infected tooth. During the exam, your dentist may tap on the infected tooth. Your dentist will also ask about your medical and dental history and may order X-rays. °TREATMENT °This condition is treated by eliminating the infection. This may be done with: °· Antibiotic medicine. °· A root canal. This may be performed to save the tooth. °· Pulling (extracting) the tooth. This may also involve draining the abscess. This is done if the tooth cannot be saved. °HOME CARE INSTRUCTIONS °· Take medicines only as directed by your dentist. °· If you were prescribed antibiotic medicine, finish all of it even if you start to feel better. °· Rinse your mouth (gargle) often with salt water to relieve pain or swelling. °· Do not drive or operate heavy machinery while taking pain medicine. °· Do not apply heat to the outside of your mouth. °· Keep all follow-up visits as directed by your dentist. This is important. °SEEK MEDICAL CARE IF: °· Your pain is worse and is not helped by medicine. °SEEK IMMEDIATE MEDICAL CARE IF: °· You have a fever or chills. °· Your symptoms suddenly get worse. °· You have a  very bad headache. °· You have problems breathing or swallowing. °· You have trouble opening your mouth. °· You have swelling in your neck or around your eye. °  °This information is not intended to replace advice given to you by your health care provider. Make sure you discuss any questions you have with your health care provider. °  °Document Released: 06/05/2005 Document Revised: 10/20/2014 Document Reviewed: 06/02/2014 °Elsevier Interactive Patient Education ©2016 Elsevier Inc. ° ° °Complete your entire course of antibiotics as prescribed.  You  may use the hydrocodone for pain relief but do not drive within 4 hours of taking as this will make you drowsy.  Avoid applying heat or ice to this abscess area which can worsen your symptoms.  You may use warm salt water swish and spit treatment or half peroxide and water swish and spit after meals to keep this area clean as discussed.  Call the dentist listed above for further management of your symptoms. ° ° ° °

## 2015-05-15 NOTE — ED Notes (Signed)
Discharge instructions given to pt , verbalized understanding re- pain med and ABT usage-- List of dentists provided per pt request. Pt ambulated off unit with family member

## 2015-05-17 NOTE — ED Provider Notes (Signed)
CSN: [id number]     Arrival date & time 05/15/15  1455 History   First MD Initiated Contact with Patient 05/15/15 1654     Chief Complaint  Patient presents with  . Dental Pain     (Consider location/radiation/quality/duration/timing/severity/associated sxs/prior Treatment) The history is provided by the patient and a parent.   Vincent Green is a 19 y.o. male presenting with acute on chronic dental pain secondary to severe decay and gingivitis.  He reports multiple dental visits over the past year including while living in [place], and since moving here without any significant resolution of his dental problems.  Father at bedside endorses frustration as patient has been asking for full dental extractions for dentures and is still awaiting definitive treatment.  He is currently under Dr. Rhona Raider care who has referred him to an oral surgeon on Dec 16.  He endorses another partial fracture of a left upper tooth and now has increased pain, cold sensitivity and swelling at the site.  He is tolerating soft foods but is having increasing difficulty chewing.  He denies fevers, denies neck or jaw swelling.  He was seen for this same new fracture 4 days ago, has completed the pain medicine but has now developed increased swelling per above.    Past Medical History  Diagnosis Date  . Scoliosis   . PTSD (post-traumatic stress disorder)   . Anxiety   . PTSD (post-traumatic stress disorder)   . Anxiety   . Chronic dental pain   . Seizures (HCC)     benzo-seizure    Past Surgical History  Procedure Laterality Date  . Skin graft    . Nose surgery    . Choanal atresia repair     History reviewed. No pertinent family history. Social History  Substance Use Topics  . Smoking status: Current Every Day Smoker -- 1.00 packs/day    Types: Cigarettes  . Smokeless tobacco: None  . Alcohol Use: No    Review of Systems  Constitutional: Negative for fever.  HENT: Positive for dental problem.  Negative for facial swelling and sore throat.   Respiratory: Negative for shortness of breath.   Musculoskeletal: Negative for neck pain and neck stiffness.      Allergies  Review of patient's allergies indicates no known allergies.  Home Medications   Prior to Admission medications   Medication Sig Start Date End Date Taking? Authorizing Provider  acetaminophen (TYLENOL) 500 MG tablet Take 500 mg by mouth every 6 (six) hours as needed.    Historical Provider, MD  albuterol (PROVENTIL HFA;VENTOLIN HFA) 108 (90 BASE) MCG/ACT inhaler Inhale 2 puffs into the lungs every 6 (six) hours as needed for wheezing or shortness of breath.    Historical Provider, MD  amoxicillin (AMOXIL) 500 MG capsule Take 1 capsule (500 mg total) by mouth 3 (three) times daily. 05/15/15 05/25/15  Burgess Amor, PA-C  clindamycin (CLEOCIN) 150 MG capsule Take 2 capsules (300 mg total) by mouth 4 (four) times daily. Patient not taking: Reported on 04/05/2015 03/11/15   Tammy Triplett, PA-C  HYDROcodone-acetaminophen (NORCO/VICODIN) 5-325 MG tablet Take 1 tablet by mouth every 4 (four) hours as needed. 05/15/15   Burgess Amor, PA-C  ibuprofen (ADVIL,MOTRIN) 600 MG tablet Take 1 tablet (600 mg total) by mouth every 6 (six) hours as needed. 05/11/15   Tiffany Neva Seat, PA-C  ibuprofen (ADVIL,MOTRIN) 800 MG tablet Take 1 tablet (800 mg total) by mouth 3 (three) times daily. 04/05/15   Ivery Quale, PA-C  oxyCODONE-acetaminophen (PERCOCET/ROXICET)  5-325 MG per tablet Take 1 tablet by mouth every 4 (four) hours as needed. Patient not taking: Reported on 04/05/2015 03/11/15   Tammy Triplett, PA-C  oxyCODONE-acetaminophen (PERCOCET/ROXICET) 5-325 MG tablet Take 1-2 tablets by mouth every 6 (six) hours as needed. 05/11/15   Marlon Peliffany Greene, PA-C  penicillin v potassium (VEETID) 500 MG tablet Take 1 tablet (500 mg total) by mouth 3 (three) times daily. 04/05/15   Ivery QualeHobson Bryant, PA-C   BP 111/64 mmHg  Pulse 106  Temp(Src) 98.1 F (36.7  C) (Oral)  Resp 18  Ht 6' (1.829 m)  Wt 68.04 kg  BMI 20.34 kg/m2  SpO2 97% Physical Exam  Constitutional: He is oriented to person, place, and time. He appears well-developed and well-nourished. No distress.  HENT:  Head: Normocephalic and atraumatic.  Right Ear: Tympanic membrane and external ear normal.  Left Ear: Tympanic membrane and external ear normal.  Mouth/Throat: Oropharynx is clear and moist and mucous membranes are normal. No oral lesions. No trismus in the jaw. Abnormal dentition. Dental abscesses and dental caries present. No uvula swelling.  Poor dentition with all teeth decayed in some degree, multiple dental fractures, diffuse gingivitis and extreme tartar build up especially along inferior anterior teeth. Sublingual space is soft.  There is more advanced edema and erythema along left upper outer gingival line, no fluctuant pockets.  No facial edema or erythema.  Eyes: Conjunctivae are normal.  Neck: Normal range of motion. Neck supple.  Cardiovascular: Normal rate and normal heart sounds.   Pulmonary/Chest: Effort normal.  Abdominal: He exhibits no distension.  Musculoskeletal: Normal range of motion.  Lymphadenopathy:    He has no cervical adenopathy.  Neurological: He is alert and oriented to person, place, and time.  Skin: Skin is warm and dry. No erythema.  Psychiatric: He has a normal mood and affect.    ED Course  Procedures (including critical care time) Labs Review Labs Reviewed - No data to display  Imaging Review No results found. I have personally reviewed and evaluated these images and lab results as part of my medical decision-making.   EKG Interpretation None      MDM   Final diagnoses:  Dental abscess    Pt was placed on amoxil, hydrocodone prescribed.  Advised to also use his home ibuprofen q 6 hours.  F/u with oral sugeon as planned.  Advised pt the ed is not the location for dental issues, he needs to f/u with his dentist and oral  surgeon for ongoing care of his teeth.      Burgess AmorJulie Jhonny Calixto, PA-C 05/17/15 1301  Samuel JesterKathleen McManus, DO 05/18/15 2004

## 2015-06-28 ENCOUNTER — Encounter (HOSPITAL_COMMUNITY): Payer: Self-pay | Admitting: *Deleted

## 2015-06-28 ENCOUNTER — Emergency Department (HOSPITAL_COMMUNITY)
Admission: EM | Admit: 2015-06-28 | Discharge: 2015-06-28 | Disposition: A | Discharge status: Home / Self Care | Payer: Medicaid Other | Attending: Emergency Medicine | Admitting: Emergency Medicine

## 2015-06-28 DIAGNOSIS — Z791 Long term (current) use of non-steroidal anti-inflammatories (NSAID): Secondary | ICD-10-CM | POA: Insufficient documentation

## 2015-06-28 DIAGNOSIS — G8929 Other chronic pain: Secondary | ICD-10-CM | POA: Insufficient documentation

## 2015-06-28 DIAGNOSIS — K029 Dental caries, unspecified: Secondary | ICD-10-CM | POA: Diagnosis not present

## 2015-06-28 DIAGNOSIS — Z8739 Personal history of other diseases of the musculoskeletal system and connective tissue: Secondary | ICD-10-CM | POA: Insufficient documentation

## 2015-06-28 DIAGNOSIS — Z79899 Other long term (current) drug therapy: Secondary | ICD-10-CM | POA: Insufficient documentation

## 2015-06-28 DIAGNOSIS — K047 Periapical abscess without sinus: Secondary | ICD-10-CM

## 2015-06-28 DIAGNOSIS — Z8659 Personal history of other mental and behavioral disorders: Secondary | ICD-10-CM | POA: Diagnosis not present

## 2015-06-28 DIAGNOSIS — K0889 Other specified disorders of teeth and supporting structures: Secondary | ICD-10-CM | POA: Diagnosis not present

## 2015-06-28 DIAGNOSIS — F17210 Nicotine dependence, cigarettes, uncomplicated: Secondary | ICD-10-CM | POA: Diagnosis not present

## 2015-06-28 MED ORDER — CLINDAMYCIN HCL 150 MG PO CAPS: 300.0000 mg | ORAL_CAPSULE | Freq: Four times a day (QID) | ORAL | Status: DC

## 2015-06-28 MED ORDER — HYDROCODONE-ACETAMINOPHEN 5-325 MG PO TABS: 1.0000 | ORAL_TABLET | ORAL | Status: DC | PRN

## 2015-06-28 NOTE — ED Notes (Signed)
Pt reporting 3 broken teeth and hasn't been able to make appointment with oral surgeon at this time. Reporting severe pain on left side of mouth.

## 2015-06-28 NOTE — ED Provider Notes (Signed)
CSN: [id number]     Arrival date & time 06/28/15  1841 History  By signing my name below, I, Vincent Green, attest that this documentation has been prepared under the direction and in the presence of [facility] Orlene Och, NP. Electronically Signed: Randell Patient, ED Scribe. 06/28/2015. 2:24 PM.   Chief Complaint  Patient presents with  . Dental Pain   Patient is a 20 y.o. male presenting with tooth pain. The history is provided by the patient. No language interpreter was used.  Dental Pain Location:  Upper Upper teeth location:  14/LU 1st molar Quality:  Unable to specify Severity:  Moderate Onset quality:  Gradual Timing:  Constant Progression:  Worsening Chronicity:  Recurrent   HPI Comments: Stratton Villwock is a 20 y.o. male who presents to the Emergency Department complaining of constant, gradually worsening, moderate upper left dental pain onset 1 year ago. Patient reports that he has been seen multiple times at [facility], most recently 3 weeks ago, and referred out to an oral surgeon, Dr. Tiburcio Pea, in [place], but that he has been unable to obtain an appointment. He endorses associated sleep disturbance secondary to pain and difficulty eating and drinking secondary to pain. Pain is worse with eating and drinking. He has used Orajel without relief and was prescribed ibuprofen-800 mg but has not taken this medication. Per patient, he notes that he was prescribed clindamycin, hydrocodone, and oxycodone in the past but has since run out of these medications. Patient denies nausea and vomiting.  Past Medical History  Diagnosis Date  . Scoliosis   . PTSD (post-traumatic stress disorder)   . Anxiety   . PTSD (post-traumatic stress disorder)   . Anxiety   . Chronic dental pain   . Seizures (HCC)     benzo-seizure    Past Surgical History  Procedure Laterality Date  . Skin graft    . Nose surgery    . Choanal atresia repair     History reviewed. No pertinent family  history. Social History  Substance Use Topics  . Smoking status: Current Every Day Smoker -- 1.00 packs/day    Types: Cigarettes  . Smokeless tobacco: None  . Alcohol Use: No    Review of Systems  HENT: Positive for dental problem (Generalized upper dental pain, worse on left).   Gastrointestinal: Negative for nausea and vomiting.  All other systems reviewed and are negative.     Allergies  Review of patient's allergies indicates no known allergies.  Home Medications   Prior to Admission medications   Medication Sig Start Date End Date Taking? Authorizing Provider  acetaminophen (TYLENOL) 500 MG tablet Take 500 mg by mouth every 6 (six) hours as needed.    Historical Provider, MD  albuterol (PROVENTIL HFA;VENTOLIN HFA) 108 (90 BASE) MCG/ACT inhaler Inhale 2 puffs into the lungs every 6 (six) hours as needed for wheezing or shortness of breath.    Historical Provider, MD  clindamycin (CLEOCIN) 150 MG capsule Take 2 capsules (300 mg total) by mouth every 6 (six) hours. 06/28/15   Hope Orlene Och, NP  HYDROcodone-acetaminophen (NORCO/VICODIN) 5-325 MG tablet Take 1 tablet by mouth every 4 (four) hours as needed. 06/28/15   Hope Orlene Och, NP  ibuprofen (ADVIL,MOTRIN) 600 MG tablet Take 1 tablet (600 mg total) by mouth every 6 (six) hours as needed. 05/11/15   Tiffany Neva Seat, PA-C  ibuprofen (ADVIL,MOTRIN) 800 MG tablet Take 1 tablet (800 mg total) by mouth 3 (three) times daily. 04/05/15   Link Snuffer  Beverely PaceBryant, PA-C   BP 121/86 mmHg  Pulse 101  Temp(Src) 99.4 F (37.4 C) (Temporal)  Resp 20  Ht 6' (1.829 m)  Wt 69.4 kg  BMI 20.75 kg/m2  SpO2 100% Physical Exam  Constitutional: He is oriented to person, place, and time. He appears well-developed and well-nourished. No distress.  HENT:  Head: Normocephalic and atraumatic.  Right Ear: Tympanic membrane normal.  Left Ear: Tympanic membrane normal.  Mouth/Throat: Uvula is midline and oropharynx is clear and moist. Dental abscesses present.   All teeth of upper mouth are decayed. There is an abscess on the 1st molar of the upper left.  Eyes: Conjunctivae and EOM are normal.  Neck: Neck supple. No tracheal deviation present.  Cardiovascular: Normal rate.   Pulmonary/Chest: Effort normal and breath sounds normal. No respiratory distress.  Musculoskeletal: Normal range of motion.  Neurological: He is alert and oriented to person, place, and time.  Skin: Skin is warm and dry.  Psychiatric: He has a normal mood and affect. His behavior is normal.  Nursing note and vitals reviewed.   ED Course  Procedures   DIAGNOSTIC STUDIES: Oxygen Saturation is 100% on RA, normal by my interpretation.    COORDINATION OF CARE: 7:57 PM Will prescribe clindamycin and pain medication. Advised pt to take ibuprofen, rinse with warm salt water and continue using Orajel. Discussed treatment plan with pt at bedside and pt agreed to plan. MDM  20 y.o. male with dental pain and multiple dental caries. Stable for d/c without trismus, fever and does not appear toxic. Discussed with the patient and all questioned fully answered. He will follow up with the oral surgeon as planned.     Final diagnoses:  Dental abscess  Pain due to dental caries   I personally performed the services described in this documentation, which was scribed in my presence. The recorded information has been reviewed and is accurate.   Southeast ArcadiaHope M Neese, [place]NP 07/03/15 1426  Bethann BerkshireJoseph Zammit, MD 07/03/15 [phone]

## 2015-06-28 NOTE — Discharge Instructions (Signed)
Follow up with the oral surgeon as soon as possible.   Dental Abscess A dental abscess is a collection of pus in or around a tooth. CAUSES This condition is caused by a bacterial infection around the root of the tooth that involves the inner part of the tooth (pulp). It may result from:  Severe tooth decay.  Trauma to the tooth that allows bacteria to enter into the pulp, such as a broken or chipped tooth.  Severe gum disease around a tooth. SYMPTOMS Symptoms of this condition include:  Severe pain in and around the infected tooth.  Swelling and redness around the infected tooth, in the mouth, or in the face.  Tenderness.  Pus drainage.  Bad breath.  Bitter taste in the mouth.  Difficulty swallowing.  Difficulty opening the mouth.  Nausea.  Vomiting.  Chills.  Swollen neck glands.  Fever. DIAGNOSIS This condition is diagnosed with examination of the infected tooth. During the exam, your dentist may tap on the infected tooth. Your dentist will also ask about your medical and dental history and may order X-rays. TREATMENT This condition is treated by eliminating the infection. This may be done with:  Antibiotic medicine.  A root canal. This may be performed to save the tooth.  Pulling (extracting) the tooth. This may also involve draining the abscess. This is done if the tooth cannot be saved. HOME CARE INSTRUCTIONS  Take medicines only as directed by your dentist.  If you were prescribed antibiotic medicine, finish all of it even if you start to feel better.  Rinse your mouth (gargle) often with salt water to relieve pain or swelling.  Do not drive or operate heavy machinery while taking pain medicine.  Do not apply heat to the outside of your mouth.  Keep all follow-up visits as directed by your dentist. This is important. SEEK MEDICAL CARE IF:  Your pain is worse and is not helped by medicine. SEEK IMMEDIATE MEDICAL CARE IF:  You have a fever or  chills.  Your symptoms suddenly get worse.  You have a very bad headache.  You have problems breathing or swallowing.  You have trouble opening your mouth.  You have swelling in your neck or around your eye.   This information is not intended to replace advice given to you by your health care provider. Make sure you discuss any questions you have with your health care provider.   Document Released: 06/05/2005 Document Revised: 10/20/2014 Document Reviewed: 06/02/2014 Elsevier Interactive Patient Education 2016 Elsevier Inc.  Dental Caries Dental caries (also called tooth decay) is the most common oral disease. It can occur at any age but is more common in children and young adults.  HOW DENTAL CARIES DEVELOPS  The process of decay begins when bacteria and foods (particularly sugars and starches) combine in your mouth to produce plaque. Plaque is a substance that sticks to the hard, outer surface of a tooth (enamel). The bacteria in plaque produce acids that attack enamel. These acids may also attack the root surface of a tooth (cementum) if it is exposed. Repeated attacks dissolve these surfaces and create holes in the tooth (cavities). If left untreated, the acids destroy the other layers of the tooth.  RISK FACTORS  Frequent sipping of sugary beverages.   Frequent snacking on sugary and starchy foods, especially those that easily get stuck in the teeth.   Poor oral hygiene.   Dry mouth.   Substance abuse such as methamphetamine abuse.   Broken or  poor-fitting dental restorations.   Eating disorders.   Gastroesophageal reflux disease (GERD).   Certain radiation treatments to the head and neck. SYMPTOMS In the early stages of dental caries, symptoms are seldom present. Sometimes white, chalky areas may be seen on the enamel or other tooth layers. In later stages, symptoms may include:  Pits and holes on the enamel.  Toothache after sweet, hot, or cold foods or  drinks are consumed.  Pain around the tooth.  Swelling around the tooth. DIAGNOSIS  Most of the time, dental caries is detected during a regular dental checkup. A diagnosis is made after a thorough medical and dental history is taken and the surfaces of your teeth are checked for signs of dental caries. Sometimes special instruments, such as lasers, are used to check for dental caries. Dental X-ray exams may be taken so that areas not visible to the eye (such as between the contact areas of the teeth) can be checked for cavities.  TREATMENT  If dental caries is in its early stages, it may be reversed with a fluoride treatment or an application of a remineralizing agent at the dental office. Thorough brushing and flossing at home is needed to aid these treatments. If it is in its later stages, treatment depends on the location and extent of tooth destruction:   If a small area of the tooth has been destroyed, the destroyed area will be removed and cavities will be filled with a material such as gold, silver amalgam, or composite resin.   If a large area of the tooth has been destroyed, the destroyed area will be removed and a cap (crown) will be fitted over the remaining tooth structure.   If the center part of the tooth (pulp) is affected, a procedure called a root canal will be needed before a filling or crown can be placed.   If most of the tooth has been destroyed, the tooth may need to be pulled (extracted). HOME CARE INSTRUCTIONS You can prevent, stop, or reverse dental caries at home by practicing good oral hygiene. Good oral hygiene includes:  Thoroughly cleaning your teeth at least twice a day with a toothbrush and dental floss.   Using a fluoride toothpaste. A fluoride mouth rinse may also be used if recommended by your dentist or health care provider.   Restricting the amount of sugary and starchy foods and sugary liquids you consume.   Avoiding frequent snacking on these  foods and sipping of these liquids.   Keeping regular visits with a dentist for checkups and cleanings. PREVENTION   Practice good oral hygiene.  Consider a dental sealant. A dental sealant is a coating material that is applied by your dentist to the pits and grooves of teeth. The sealant prevents food from being trapped in them. It may protect the teeth for several years.  Ask about fluoride supplements if you live in a community without fluorinated water or with water that has a low fluoride content. Use fluoride supplements as directed by your dentist or health care provider.  Allow fluoride varnish applications to teeth if directed by your dentist or health care provider.   This information is not intended to replace advice given to you by your health care provider. Make sure you discuss any questions you have with your health care provider.   Document Released: 02/25/2002 Document Revised: 06/26/2014 Document Reviewed: 06/07/2012 Elsevier Interactive Patient Education [employer]

## 2015-08-23 ENCOUNTER — Emergency Department (HOSPITAL_COMMUNITY)
Admission: EM | Admit: 2015-08-23 | Discharge: 2015-08-23 | Disposition: A | Discharge status: Home / Self Care | Payer: Medicaid Other | Attending: Emergency Medicine | Admitting: Emergency Medicine

## 2015-08-23 ENCOUNTER — Encounter (HOSPITAL_COMMUNITY): Payer: Self-pay | Admitting: *Deleted

## 2015-08-23 DIAGNOSIS — K089 Disorder of teeth and supporting structures, unspecified: Secondary | ICD-10-CM

## 2015-08-23 DIAGNOSIS — Z7289 Other problems related to lifestyle: Secondary | ICD-10-CM | POA: Insufficient documentation

## 2015-08-23 DIAGNOSIS — F4310 Post-traumatic stress disorder, unspecified: Secondary | ICD-10-CM | POA: Insufficient documentation

## 2015-08-23 DIAGNOSIS — G8929 Other chronic pain: Secondary | ICD-10-CM | POA: Insufficient documentation

## 2015-08-23 DIAGNOSIS — K0889 Other specified disorders of teeth and supporting structures: Secondary | ICD-10-CM | POA: Insufficient documentation

## 2015-08-23 DIAGNOSIS — F17210 Nicotine dependence, cigarettes, uncomplicated: Secondary | ICD-10-CM | POA: Insufficient documentation

## 2015-08-23 DIAGNOSIS — Z765 Malingerer [conscious simulation]: Secondary | ICD-10-CM

## 2015-08-23 NOTE — ED Notes (Signed)
Pt left angry because he wasn't given a narcotic. States the joke was on her because he was in a methadone clinic. Pt escorted by staff to exit

## 2015-08-23 NOTE — ED Provider Notes (Signed)
CSN: [id number]     Arrival date & time 08/23/15  1108 History  By signing my name below, I, Soijett Blue, attest that this documentation has been prepared under the direction and in the presence of Langston MaskerKaren Othel Dicostanzo, PA-C Electronically Signed: Soijett Blue, ED Scribe. 08/23/2015. 1:51 PM.   Chief Complaint  Patient presents with  . Dental Pain     The history is provided by the patient. No language interpreter was used.    Vincent Green is a 20 y.o. male with a medical hx of chronic dental pain who presents to the Emergency Department complaining of constant, worsening, moderate, left upper dental pain onset 5 days. Pt reports that he has several cracked teeth and that he has a small abscess to the area that has enlarged. Pt grandfather called [facility] to have an appointment set up for 14 teeth extractions. Pt reports that he has a periodontal surgery set up but they will not do the surgery until his abscess is resolved. He states that has not tried any medications for the relief for his symptoms. He denies facial swelling, fever, chills, and any other symptoms. Pt denies being on any daily medications.   Past Medical History  Diagnosis Date  . Scoliosis   . PTSD (post-traumatic stress disorder)   . Anxiety   . PTSD (post-traumatic stress disorder)   . Anxiety   . Chronic dental pain   . Seizures (HCC)     benzo-seizure    Past Surgical History  Procedure Laterality Date  . Skin graft    . Nose surgery    . Choanal atresia repair     No family history on file. Social History  Substance Use Topics  . Smoking status: Current Every Day Smoker -- 1.00 packs/day    Types: Cigarettes  . Smokeless tobacco: None  . Alcohol Use: No    Review of Systems  Constitutional: Negative for fever and chills.  HENT: Positive for dental problem. Negative for facial swelling and trouble swallowing.   Skin: Negative for rash.    Allergies  Review of patient's allergies indicates no known  allergies.  Home Medications   Prior to Admission medications   Medication Sig Start Date End Date Taking? Authorizing Provider  acetaminophen (TYLENOL) 500 MG tablet Take 500 mg by mouth every 6 (six) hours as needed.    Historical Provider, MD  albuterol (PROVENTIL HFA;VENTOLIN HFA) 108 (90 BASE) MCG/ACT inhaler Inhale 2 puffs into the lungs every 6 (six) hours as needed for wheezing or shortness of breath.    Historical Provider, MD  clindamycin (CLEOCIN) 150 MG capsule Take 2 capsules (300 mg total) by mouth every 6 (six) hours. 06/28/15   Hope Orlene OchM Neese, NP  HYDROcodone-acetaminophen (NORCO/VICODIN) 5-325 MG tablet Take 1 tablet by mouth every 4 (four) hours as needed. 06/28/15   Hope Orlene OchM Neese, NP  ibuprofen (ADVIL,MOTRIN) 600 MG tablet Take 1 tablet (600 mg total) by mouth every 6 (six) hours as needed. 05/11/15   Tiffany Neva SeatGreene, PA-C  ibuprofen (ADVIL,MOTRIN) 800 MG tablet Take 1 tablet (800 mg total) by mouth 3 (three) times daily. 04/05/15   Ivery QualeHobson Bryant, PA-C   BP 111/57 mmHg  Pulse 82  Temp(Src) 98.6 F (37 C) (Oral)  Resp 16  Ht 6' (1.829 m)  Wt 163 lb (73.936 kg)  BMI 22.10 kg/m2  SpO2 100% Physical Exam  Constitutional: He is oriented to person, place, and time. He appears well-developed and well-nourished. No distress.  HENT:  Head: Normocephalic and atraumatic.  Mouth/Throat: No trismus in the jaw. Abnormal dentition. Dental caries present.  Severe dental decay. Several broken, eroded, gray base teeth.  Eyes: EOM are normal.  Neck: Neck supple.  Cardiovascular: Normal rate.   Pulmonary/Chest: Effort normal. No respiratory distress.  Musculoskeletal: Normal range of motion.  Neurological: He is alert and oriented to person, place, and time.  Skin: Skin is warm and dry.  Psychiatric: He has a normal mood and affect. His behavior is normal.  Nursing note and vitals reviewed.   ED Course  Procedures (including critical care time) DIAGNOSTIC STUDIES: Oxygen Saturation  is 100% on RA, nl by my interpretation.    COORDINATION OF CARE: 1:51 PM Discussed treatment plan with pt at bedside which includes follow up with dental appointment, clindamycin Rx, and pt agreed to plan.  2:10 PM- When pt was looked up on the [facility] it was found that the pt has been getting narcotics from several different providers for his symptoms.   2:13 PM- When pt was informed of this, pt became hostile towards the PA and stated that he goes to a [facility] clinic that he was recently kicked out of. Pt then left AMA without his antibiotic.   Labs Review Labs Reviewed - No data to display  Imaging Review No results found.   EKG Interpretation None      MDM  I offered pt referrals to substance abuse treatment facilities.  Pt told me I had to give him pain medication or he would report me.  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  Pt verbally abusive.     Final diagnoses:  Drug-seeking behavior  Chronic dental pain       Elson Areas, PA-C 08/23/15 1428  Raeford Razor, MD 08/23/15 [phone]

## 2015-08-23 NOTE — ED Notes (Signed)
Pt comes in with upper left gum dental pain starting 5 days ago. Pt has no throat swelling at this time. Pt states he has a periodontal surgery set up but they refused to do the surgery because of the abscess.

## 2015-08-31 ENCOUNTER — Encounter (HOSPITAL_COMMUNITY): Payer: Self-pay | Admitting: Emergency Medicine

## 2015-08-31 ENCOUNTER — Emergency Department (HOSPITAL_COMMUNITY)
Admission: EM | Admit: 2015-08-31 | Discharge: 2015-08-31 | Disposition: A | Discharge status: Home / Self Care | Payer: Medicaid Other | Attending: Emergency Medicine | Admitting: Emergency Medicine

## 2015-08-31 DIAGNOSIS — Z8659 Personal history of other mental and behavioral disorders: Secondary | ICD-10-CM | POA: Diagnosis not present

## 2015-08-31 DIAGNOSIS — F17210 Nicotine dependence, cigarettes, uncomplicated: Secondary | ICD-10-CM | POA: Diagnosis not present

## 2015-08-31 DIAGNOSIS — K029 Dental caries, unspecified: Secondary | ICD-10-CM | POA: Insufficient documentation

## 2015-08-31 DIAGNOSIS — Z79899 Other long term (current) drug therapy: Secondary | ICD-10-CM | POA: Diagnosis not present

## 2015-08-31 DIAGNOSIS — K002 Abnormalities of size and form of teeth: Secondary | ICD-10-CM | POA: Insufficient documentation

## 2015-08-31 DIAGNOSIS — M419 Scoliosis, unspecified: Secondary | ICD-10-CM | POA: Insufficient documentation

## 2015-08-31 DIAGNOSIS — G8929 Other chronic pain: Secondary | ICD-10-CM | POA: Diagnosis not present

## 2015-08-31 DIAGNOSIS — K0889 Other specified disorders of teeth and supporting structures: Secondary | ICD-10-CM | POA: Insufficient documentation

## 2015-08-31 MED ORDER — PENICILLIN V POTASSIUM 250 MG PO TABS: 500.0000 mg | ORAL_TABLET | Freq: Two times a day (BID) | ORAL | Status: AC

## 2015-08-31 MED ORDER — IBUPROFEN 800 MG PO TABS: 800.0000 mg | ORAL_TABLET | Freq: Three times a day (TID) | ORAL | Status: DC

## 2015-08-31 NOTE — ED Notes (Addendum)
Pt c/o dental pain worse over past 2 weeks. sts abscess was tx with antibiotics. Pt also reports newly broken tooth.

## 2015-08-31 NOTE — Discharge Instructions (Signed)
Take your medications as prescribed. I recommend eating prior to taking your ibuprofen to prevent GI symptoms. He may also apply ice for 15-20 minutes 3-4 times daily to affected area to help with pain and swelling. Follow-up with one of the dental clinics listed below to schedule an appointment for further management. Return to the emergency department if symptoms worsen or new onset of fever, drainage, facial/neck swelling, difficulty swallowing, difficulty breathing.  [facility] [employer] Community Service Learning [facility] [address] [place], [place] [address] Phone [phone]  The [employer] Community Service Learning Center in [place], [place], exemplifies the [employer] vision to improve the health and quality of life of all [place] by Public house managercreating leaders with a passion to care for the underserved and by leading the nation in community-based, service learning oral health education.  We are committed to offering comprehensive general dental services for adults, children and special needs patients in a safe, caring and professional setting.   Appointments: Our clinic is open Monday through Friday 8:00 a.m. until 5:00 p.m. The amount of time scheduled for an appointment depends on the patients specific needs. We ask that you keep your appointed time for care or provide 24-hour notice of all appointment changes. Parents or legal guardians must accompany minor children.   Payment for Services: Medicaid and other insurance plans are welcome. Payment for services is due when services are rendered and may be made by cash or credit card. If you have dental insurance, we will assist you with your claim submission.    Emergencies:  Emergency services will be provided Monday through Friday on a walk-in basis.  Please arrive early for emergency services. After hours emergency services will  be provided for patients of record as required.   Services:  Armed forces operational officerComprehensive General Dentistry Childrens Dentistry Oral Surgery - Extractions Root Canals Sealants and Tooth Colored Fillings Crowns and Bridges Dentures and Partial Dentures Implant Services Periodontal Services and Cleanings Cosmetic Armed forces operational officerTooth Whitening Digital Radiography 3-D/[facility] [employer] [employer]   [employer] Resource Guide Dental The United Ways 211 is a great source of information about community services available.  Access by dialing 2-1-1 from anywhere in [place], or by website -  PooledIncome.plwww.nc211.org.   Other Local Resources (Updated 06/2015)  Dental  Care   Services    Phone Number and Address  Cost  [facility] For children 680 - 21 years of age:   Cleaning  Tooth brushing/flossing instruction  Sealants, fillings, crowns  Extractions  Emergency treatment  [phone] [address] [place], [place] [address] Charges based on family income.  Medicaid and some insurance plans accepted.     [facility]  Cleaning  Sealants, fillings, crowns  Extractions  Emergency treatment [phone] [address] [place], [place]  Pregnant women 21 years of age or older with a Medicaid card  [facility] [place]  Cleaning  Sealants, fillings, crowns  Extractions  Emergency treatment [phone] [address] [place], [place] Pregnant women 21 years of age or older with a Medicaid card  [facility] For children 310 - 21 years of age:   Cleaning  Tooth brushing/flossing instruction  Sealants, fillings, crowns  Extractions  Emergency treatment Limited orthodontic services for patients with Medicaid [phone] [address] [place], [place] [address] Medicaid and [facility] Health Choice  cover for children up to age 21 and  pregnant women.  Parents of children up to age 421 without Medicaid pay a reduced fee at time of service.  [facility] [place] For children 530 - 21 years of age:   Cleaning  Tooth brushing/flossing instruction  Sealants, fillings, crowns  Extractions  Emergency treatment Limited orthodontic services for patients with Medicaid [phone] [address] [place], [place].  Medicaid and [place] Health Choice cover for children up to age 21 and pregnant women.  Parents of children up to age 21 without Medicaid pay a reduced fee.  [facility] [facility]  Cleaning  Sealants, fillings, crowns  Extractions  Hours: Tuesdays and Thursdays, 4:15 - 8 pm [phone] [address], [address] [place], [place] [address] Services free of charge to [facility] residents ages 18-64 who do not have health insurance, Medicare, [place], or [place] benefits and fall within federal poverty guidelines  [facility]    Provides dental care in addition to primary medical care, nutritional counseling, and pharmacy:  Nurse, mental healthCleaning  Sealants, fillings, crowns  Extractions                  [phone] [facility], [address] [place], [place]  [phone] [facility], [address] [place], [place]  [phone] [facility] [place], [place]  [phone] [facility], [address] [place] [address] [place], [place]  [phone] [facility] [address] [place], [place] Accepts [place], [place], most insurance.  Also provides services available to all with fees adjusted based on ability to pay.    [facility]  Cleaning  Tooth brushing/flossing instruction  Sealants, fillings, crowns  Extractions  Emergency treatment Hours: Tuesdays, Thursdays, and Fridays from 8  am to 5 pm by appointment only. [phone] [address] [place], [place] [address] [facility] residents with Medicaid (depending on eligibility) and children with [facility] Health Choice - call for more information.  [facility]  Extractions only  Hours: 2nd and 4th Thursday of each month from 6:30 am - 9 am.   [phone] [address] [place], [place] [address] Ages 8318 and older only.  Patients are seen on a first come, first served basis.  [employer]  [employer]ings  Fillings  Extractions  Orthodontics  Endodontics  Implants/Crowns/Bridges  Complete and partial dentures [phone] [place], [place] Patients must complete an application for services.  There is often a waiting list.

## 2015-08-31 NOTE — ED Provider Notes (Signed)
CSN: [id number]     Arrival date & time 08/31/15  2020 History  By signing my name below, I, Vincent Green, attest that this documentation has been prepared under the direction and in the presence of non-physician practitioner, Melburn HakeNicole Lyncoln Ledgerwood, PA-C. Electronically Signed: Freida Busmaniana Green, Scribe. 08/31/2015. 8:57 PM.     Chief Complaint  Patient presents with  . Dental Pain    The history is provided by the patient and medical records. No language interpreter was used.    HPI Comments:  Vincent Green is a 20 y.o. male who presents to the Emergency Department complaining of "severe" left upper dental pain x ~ 2 weeks. Pt reports associated mild left sided facial swelling. He has taken tylenol, aleve, and ibuprofen without relief. Pt was seen in the ED on 08/23/15 for the same and left AMA. He denies fever, neck pain/swelling, difficulty swallowing, SOB and sour drainage in his mouth. Pt reports h/o similar dental pain in the past; states he was told he needed to have 14 teeth extracted but did not follow up. He does not currently have a dentist he can follow up with.    Past Medical History  Diagnosis Date  . Scoliosis   . PTSD (post-traumatic stress disorder)   . Anxiety   . PTSD (post-traumatic stress disorder)   . Anxiety   . Chronic dental pain   . Seizures (HCC)     benzo-seizure    Past Surgical History  Procedure Laterality Date  . Skin graft    . Nose surgery    . Choanal atresia repair     No family history on file. Social History  Substance Use Topics  . Smoking status: Current Every Day Smoker -- 1.00 packs/day    Types: Cigarettes  . Smokeless tobacco: None  . Alcohol Use: No    Review of Systems  Constitutional: Negative for fever and chills.  HENT: Positive for dental problem and facial swelling. Negative for trouble swallowing.   Respiratory: Negative for shortness of breath.   Musculoskeletal: Negative for neck pain.    Allergies  Review of patient's  allergies indicates no known allergies.  Home Medications   Prior to Admission medications   Medication Sig Start Date End Date Taking? Authorizing Provider  acetaminophen (TYLENOL) 500 MG tablet Take 500 mg by mouth every 6 (six) hours as needed.    Historical Provider, MD  albuterol (PROVENTIL HFA;VENTOLIN HFA) 108 (90 BASE) MCG/ACT inhaler Inhale 2 puffs into the lungs every 6 (six) hours as needed for wheezing or shortness of breath.    Historical Provider, MD  clindamycin (CLEOCIN) 150 MG capsule Take 2 capsules (300 mg total) by mouth every 6 (six) hours. 06/28/15   Hope Orlene OchM Neese, NP  HYDROcodone-acetaminophen (NORCO/VICODIN) 5-325 MG tablet Take 1 tablet by mouth every 4 (four) hours as needed. 06/28/15   Hope Orlene OchM Neese, NP  ibuprofen (ADVIL,MOTRIN) 800 MG tablet Take 1 tablet (800 mg total) by mouth 3 (three) times daily. 08/31/15   Barrett HenleNicole Elizabeth Vangie Henthorn, PA-C  penicillin v potassium (VEETID) 250 MG tablet Take 2 tablets (500 mg total) by mouth 2 (two) times daily. 08/31/15 09/07/15  Satira SarkNicole Elizabeth Debroh Sieloff, PA-C   BP 132/75 mmHg  Pulse 97  Temp(Src) 97.9 F (36.6 C) (Oral)  Resp 18  SpO2 100% Physical Exam  Constitutional: He is oriented to person, place, and time. He appears well-developed and well-nourished.  HENT:  Head: Normocephalic and atraumatic.  Mouth/Throat: Uvula is midline, oropharynx is clear and  moist and mucous membranes are normal. No trismus in the jaw. Abnormal dentition. Dental caries present. No dental abscesses or uvula swelling. No oropharyngeal exudate.  Severe dental decay noted throughout with several broken and eroded teeth. Mild swelling and erythema noted to gingiva surrounding tooth #13, no induration, fluctuance or drainage noted.   Eyes: Conjunctivae and EOM are normal. Right eye exhibits no discharge. Left eye exhibits no discharge. No scleral icterus.  Neck: Normal range of motion. Neck supple.  Cardiovascular: Normal rate.   Pulmonary/Chest: Effort  normal. No stridor.  Lymphadenopathy:    He has no cervical adenopathy.  Neurological: He is alert and oriented to person, place, and time.  Skin: Skin is dry.  Nursing note and vitals reviewed.   ED Course  Procedures    DIAGNOSTIC STUDIES:  Oxygen Saturation is 100% on RA, normal by my interpretation.    COORDINATION OF CARE:  8:56 PM Discussed treatment plan with pt at bedside and pt agreed to plan.   MDM   Final diagnoses:  Pain, dental   Patient has a care plan in place due to multiple ED visits for dental pain with drug-seeking behavior. I reviewed the patient's care plan.  Patient with toothache.  No gross abscess.  Exam unconcerning for Ludwig's angina or spread of infection.  Will treat with penicillin and pain medicine.  Urged patient to follow-up with dentist.   Patient given resources to follow up with PCP.  I personally performed the services described in this documentation, which was scribed in my presence. The recorded information has been reviewed and is accurate.    Satira Sark [place], [place] 08/31/15 2104  Margarita Grizzle, MD 08/31/15 2330

## 2015-11-13 ENCOUNTER — Emergency Department (HOSPITAL_COMMUNITY)
Admission: EM | Admit: 2015-11-13 | Discharge: 2015-11-13 | Disposition: A | Discharge status: Home / Self Care | Payer: Medicaid Other | Attending: Emergency Medicine | Admitting: Emergency Medicine

## 2015-11-13 ENCOUNTER — Encounter (HOSPITAL_COMMUNITY): Payer: Self-pay

## 2015-11-13 DIAGNOSIS — K0889 Other specified disorders of teeth and supporting structures: Secondary | ICD-10-CM | POA: Diagnosis present

## 2015-11-13 DIAGNOSIS — F17210 Nicotine dependence, cigarettes, uncomplicated: Secondary | ICD-10-CM | POA: Insufficient documentation

## 2015-11-13 MED ORDER — CLINDAMYCIN HCL 300 MG PO CAPS: 300.0000 mg | ORAL_CAPSULE | Freq: Four times a day (QID) | ORAL | Status: DC

## 2015-11-13 MED ORDER — DICLOFENAC SODIUM 75 MG PO TBEC: 75.0000 mg | DELAYED_RELEASE_TABLET | Freq: Two times a day (BID) | ORAL | Status: DC

## 2015-11-13 NOTE — ED Notes (Signed)
Complains of left upper dental pain that started yesterday.

## 2015-11-13 NOTE — ED Provider Notes (Signed)
CSN: [id number]     Arrival date & time 11/13/15  1704 History   First MD Initiated Contact with Patient 11/13/15 1742     Chief Complaint  Patient presents with  . Dental Pain     (Consider location/radiation/quality/duration/timing/severity/associated sxs/prior Treatment) HPI   Vincent Green is a 20 y.o. male who presents to the Emergency Department complaining of one day history of left upper tooth pain.  Pain of sudden onset.  Worse with chewing, heat and cold exposures.  He has multiple ER visits for dental pain and states that he cannot find a dentist that accepts Medicaid.  He denies facial swelling, neck pain, fever and difficulty swallowing.  Has taken ibuprofen without relief.    Past Medical History  Diagnosis Date  . Scoliosis   . PTSD (post-traumatic stress disorder)   . Anxiety   . PTSD (post-traumatic stress disorder)   . Anxiety   . Chronic dental pain   . Seizures (HCC)     benzo-seizure    Past Surgical History  Procedure Laterality Date  . Skin graft    . Nose surgery    . Choanal atresia repair     No family history on file. Social History  Substance Use Topics  . Smoking status: Current Every Day Smoker -- 1.00 packs/day    Types: Cigarettes  . Smokeless tobacco: None  . Alcohol Use: No    Review of Systems  Constitutional: Negative for fever and appetite change.  HENT: Positive for dental problem. Negative for congestion, facial swelling, sore throat and trouble swallowing.   Eyes: Negative for pain and visual disturbance.  Musculoskeletal: Negative for neck pain and neck stiffness.  Neurological: Negative for dizziness, facial asymmetry and headaches.  Hematological: Negative for adenopathy.  All other systems reviewed and are negative.     Allergies  Review of patient's allergies indicates no known allergies.  Home Medications   Prior to Admission medications   Medication Sig Start Date End Date Taking? Authorizing Provider   acetaminophen (TYLENOL) 500 MG tablet Take 500 mg by mouth every 6 (six) hours as needed.    Historical Provider, MD  albuterol (PROVENTIL HFA;VENTOLIN HFA) 108 (90 BASE) MCG/ACT inhaler Inhale 2 puffs into the lungs every 6 (six) hours as needed for wheezing or shortness of breath.    Historical Provider, MD  clindamycin (CLEOCIN) 150 MG capsule Take 2 capsules (300 mg total) by mouth every 6 (six) hours. 06/28/15   Hope Orlene Och, NP  HYDROcodone-acetaminophen (NORCO/VICODIN) 5-325 MG tablet Take 1 tablet by mouth every 4 (four) hours as needed. 06/28/15   Hope Orlene Och, NP  ibuprofen (ADVIL,MOTRIN) 800 MG tablet Take 1 tablet (800 mg total) by mouth 3 (three) times daily. 08/31/15   Satira Sark Nadeau, PA-C   BP 120/68 mmHg  Pulse 110  Temp(Src) 98 F (36.7 C) (Oral)  Resp 16  Ht 6' (1.829 m)  Wt 68.04 kg  BMI 20.34 kg/m2  SpO2 98% Physical Exam  Constitutional: He is oriented to person, place, and time. He appears well-developed and well-nourished. No distress.  HENT:  Head: Normocephalic and atraumatic.  Right Ear: Tympanic membrane and ear canal normal.  Left Ear: Tympanic membrane and ear canal normal.  Mouth/Throat: Uvula is midline, oropharynx is clear and moist and mucous membranes are normal. No trismus in the jaw. Dental caries present. No dental abscesses or uvula swelling.  Wide spread dental decay and tenderness to palp of the left upper first and second molars.  No facial swelling, obvious dental abscess, trismus, or sublingual abnml.    Neck: Normal range of motion. Neck supple.  Cardiovascular: Normal rate, regular rhythm and normal heart sounds.   No murmur heard. Pulmonary/Chest: Effort normal and breath sounds normal.  Musculoskeletal: Normal range of motion.  Lymphadenopathy:    He has no cervical adenopathy.  Neurological: He is alert and oriented to person, place, and time. He exhibits normal muscle tone. Coordination normal.  Skin: Skin is warm and dry.   Nursing note and vitals reviewed.   ED Course  Procedures (including critical care time) Labs Review Labs Reviewed - No data to display  Imaging Review No results found. I have personally reviewed and evaluated these images and lab results as part of my medical decision-making.   EKG Interpretation None      MDM   Final diagnoses:  Pain, dental    Pt well appearing.  Non-toxic.  Vitals stable.  No concerning sx's for dental abscess or Ludwig's angina.    given referral list for local dentists.  Rx for clinda and diclofenac.  Upon d/c, pt upset that he wasn't receiving narcotic for pain.  I explained that narcotic not indicated and pt has multiple ED visits for dental pain and drug seeking behaviors.  Pt left the dept cooperatively and mumbling to himself  Pauline Ausammy Xue Low, PA-C 11/14/15 1420  Vanetta MuldersScott Zackowski, MD 11/17/15 1649

## 2015-11-24 ENCOUNTER — Encounter (HOSPITAL_COMMUNITY): Payer: Self-pay | Admitting: Emergency Medicine

## 2015-11-24 ENCOUNTER — Emergency Department (HOSPITAL_COMMUNITY)
Admission: EM | Admit: 2015-11-24 | Discharge: 2015-11-24 | Disposition: A | Discharge status: Home / Self Care | Payer: Medicaid Other | Attending: Emergency Medicine | Admitting: Emergency Medicine

## 2015-11-24 DIAGNOSIS — F17210 Nicotine dependence, cigarettes, uncomplicated: Secondary | ICD-10-CM | POA: Insufficient documentation

## 2015-11-24 DIAGNOSIS — K0889 Other specified disorders of teeth and supporting structures: Secondary | ICD-10-CM | POA: Diagnosis present

## 2015-11-24 DIAGNOSIS — Z79899 Other long term (current) drug therapy: Secondary | ICD-10-CM | POA: Diagnosis not present

## 2015-11-24 MED ORDER — NAPROXEN 250 MG PO TABS: 250.0000 mg | ORAL_TABLET | Freq: Two times a day (BID) | ORAL | Status: DC

## 2015-11-24 MED ORDER — PENICILLIN V POTASSIUM 500 MG PO TABS
500.0000 mg | ORAL_TABLET | Freq: Four times a day (QID) | ORAL | Status: DC
Start: 2015-11-24 — End: 2016-11-11

## 2015-11-24 NOTE — ED Notes (Signed)
Pt left room before discharge and went to waiting area. Grandfather arguing about pain medication. Pt came back in room asking for narcotic pain medication. Pt asking RN to "write me a prescription for Tylenol 3 at least." Pt informed no prescription could be written by RN.

## 2015-11-24 NOTE — ED Notes (Addendum)
Pt reports two teeth breaking in top left side of mouth and one tooth on top right side of mouth two days ago.  Pt has abscess to left upper teeth causing slight swelling, pt reports fevers.  Pt also has a scab to back of head 4-7 days ago, pt does not know where this came from.

## 2015-11-24 NOTE — Discharge Instructions (Signed)
Community Resource Guide Dental °The United Way’s “211” is a great source of information about community services available.  Access by dialing 2-1-1 from anywhere in [place], or by website -  www.nc211.org.  ° °Other Local Resources (Updated 06/2015) ° °Dental  Care °  °Services ° °  °Phone Number and Address  °Cost  °[facility] For children 0 - 21 years of age:  °• Cleaning °• Tooth brushing/flossing instruction °• Sealants, fillings, crowns °• Extractions °• Emergency treatment  [phone] °[address] °[place], [place] [address] Charges based on family income.  Medicaid and some insurance plans accepted.   °  °[facility] [place] • Cleaning °• Sealants, fillings, crowns °• Extractions °• Emergency treatment [phone] °[address] °[place], [place] ° Pregnant women 18 years of age or older with a Medicaid card  °[facility] [place] • Cleaning °• Sealants, fillings, crowns °• Extractions °• Emergency treatment [phone] °[address] °[place], [place] Pregnant women 18 years of age or older with a Medicaid card  °[facility] For children 0 - 21 years of age:  °• Cleaning °• Tooth brushing/flossing instruction °• Sealants, fillings, crowns °• Extractions °• Emergency treatment °Limited orthodontic services for patients with Medicaid [phone] °[address] °[place], [place] [address] Medicaid and [place] Health Choice cover for children up to age 21 and pregnant women.  Parents of children up to age 21 without Medicaid pay a reduced fee at time of service.  °[facility] [place] For children 0 - 21 years of age:  °• Cleaning °• Tooth brushing/flossing instruction °• Sealants, fillings, crowns °• Extractions °• Emergency treatment °Limited orthodontic services for patients with Medicaid  [phone] °[address] °[place], [place].  Medicaid and [place] Health Choice cover for children up to age 21 and pregnant women.  Parents of children up to age 21 without Medicaid pay a reduced fee.  °[facility] • Cleaning °• Sealants, fillings, crowns °• Extractions ° °Hours: Tuesdays and Thursdays, 4:15 - 8 pm [phone] °[address] °[place], [place] [address] Services free of charge to [place] residents ages 18-64 who do not have health insurance, Medicare, Medicaid, or VA benefits and fall within federal poverty guidelines  °[facility] ° ° ° Provides dental care in addition to primary medical care, nutritional counseling, and pharmacy: °• Cleaning °• Sealants, fillings, crowns °• Extractions ° ° ° ° ° ° ° ° ° ° ° ° ° ° ° ° ° [phone] °[facility], [address] °[place], [place] ° °[phone] °[facility], [address] [place], [place] ° °[phone] °[facility] °[place], [place] ° °[phone] °[facility], [address] °[place], [place] ° °[phone] °[facility] °[address] °[place], [place] Accepts Medicaid, Medicare, most insurance.  Also provides services available to all with fees adjusted based on ability to pay.    °[facility] • Cleaning °• Tooth brushing/flossing instruction °• Sealants, fillings, crowns °• Extractions °• Emergency treatment °Hours: Tuesdays, Thursdays, and Fridays from 8 am to 5 pm by appointment only. [phone] °[address] °[place], [place] [address] [place] residents with Medicaid (depending on eligibility) and children with [place] Health Choice - call for more information.  °  [facility] • Extractions only ° °Hours: 2nd and 4th Thursday of each month from 6:30 am - 9 am.   [phone] ext. [phone] °[address] °[place], [place] [address] Ages 18 and older only.  Patients are seen on a first come, first served basis.  °[facility] • Cleanings °• Fillings °• Extractions °• Orthodontics °• Endodontics °• Implants/Crowns/Bridges °• Complete and partial dentures [phone] °[place], [place] Patients must complete an application for services.  There is often a waiting list.   °Dental Pain °Dental pain may be caused by many things, including: °· Tooth decay (cavities or caries). Cavities expose the nerve of your tooth to air and hot or cold temperatures. This can cause pain or discomfort. °· Abscess or infection. A dental abscess is a collection of infected pus from a bacterial infection in the inner part of the tooth (pulp). It usually occurs at the end of the tooth's root. °· Injury. °· An unknown reason (idiopathic). °Your pain may be mild or severe. It may only occur when: °· You are chewing. °· You are exposed to hot or cold temperature. °· You are eating or drinking sugary foods or beverages, such as soda or candy. °Your pain may also be constant. °HOME CARE INSTRUCTIONS °Watch your dental pain for any changes. The following actions may help to lessen any discomfort that you are feeling: °· Take medicines only as directed by your dentist. °· If you were prescribed an antibiotic medicine, finish all of it even if you start to feel better. °· Keep all follow-up visits as directed by your dentist. This is important. °· Do not apply heat to the outside of your face. °· Rinse your mouth or gargle with salt water if directed by your dentist. This helps with pain and swelling. °¨ You can make salt water by adding ¼ tsp of salt to 1 cup of warm water. °· Apply ice to the painful area of your face: °¨ Put ice in a plastic bag. °¨ Place a towel between your skin and the bag. °¨ Leave the ice on for 20 minutes, 2-3 times per day. °· Avoid foods or drinks that cause you pain, such as: °¨ Very hot or very cold foods or  drinks. °¨ Sweet or sugary foods or drinks. °SEEK MEDICAL CARE IF: °· Your pain is not controlled with medicines. °· Your symptoms are worse. °· You have new symptoms. °SEEK IMMEDIATE MEDICAL CARE IF: °· You are unable to open your mouth. °· You are having trouble breathing or swallowing. °· You have a fever. °· Your face, neck, or jaw is swollen. °  °This information is not intended to replace advice given to you by your health care provider. Make sure you discuss any questions you have with your health care provider. °  °Document Released: 06/05/2005 Document Revised: 10/20/2014 Document Reviewed: 06/01/2014 °Elsevier Interactive Patient Education ©2016 Elsevier Inc. ° °

## 2015-11-24 NOTE — ED Provider Notes (Signed)
CSN: [id number]     Arrival date & time 11/24/15  1134 History   First MD Initiated Contact with Patient 11/24/15 1220     Chief Complaint  Patient presents with  . Dental Pain    Vincent Green is a 20 y.o. male Who presents the emergency department complaining of bilateral upper dental pain ongoing for several days. Patient reports ongoing pain that has now worsened. He has multiple visits to the emergency department for dental pain previously. He reports he was on clindamycin but recently stopped this. He has been taking ibuprofen and Tylenol intermittently without relief of his pain. He complains of severe pain to his upper teeth. He has not been able to follow-up with a dentist. He denies fevers, discharge from his mouth, sore throat, trouble swallowing, or facial swelling.  Patient is a 20 y.o. male presenting with tooth pain. The history is provided by the patient. No language interpreter was used.  Dental Pain Associated symptoms: no drooling, no facial swelling, no fever, no headaches and no neck pain     Past Medical History  Diagnosis Date  . Scoliosis   . PTSD (post-traumatic stress disorder)   . Anxiety   . PTSD (post-traumatic stress disorder)   . Anxiety   . Chronic dental pain   . Seizures (HCC)     benzo-seizure    Past Surgical History  Procedure Laterality Date  . Skin graft    . Nose surgery    . Choanal atresia repair     History reviewed. No pertinent family history. Social History  Substance Use Topics  . Smoking status: Current Every Day Smoker -- 1.00 packs/day    Types: Cigarettes  . Smokeless tobacco: None  . Alcohol Use: No    Review of Systems  Constitutional: Negative for fever and chills.  HENT: Positive for dental problem. Negative for drooling, ear pain, facial swelling, sore throat and trouble swallowing.   Eyes: Negative for pain and visual disturbance.  Musculoskeletal: Negative for neck pain and neck stiffness.  Skin: Negative for  rash.  Neurological: Negative for headaches.      Allergies  Review of patient's allergies indicates no known allergies.  Home Medications   Prior to Admission medications   Medication Sig Start Date End Date Taking? Authorizing Provider  acetaminophen (TYLENOL) 500 MG tablet Take 500-1,000 mg by mouth every 6 (six) hours as needed for moderate pain.    Yes Historical Provider, MD  albuterol (PROVENTIL HFA;VENTOLIN HFA) 108 (90 BASE) MCG/ACT inhaler Inhale 2 puffs into the lungs every 6 (six) hours as needed for wheezing or shortness of breath.   Yes Historical Provider, MD  naproxen (NAPROSYN) 250 MG tablet Take 1 tablet (250 mg total) by mouth 2 (two) times daily with a meal. 11/24/15   Everlene FarrierWilliam Anora Schwenke, PA-C  penicillin v potassium (VEETID) 500 MG tablet Take 1 tablet (500 mg total) by mouth 4 (four) times daily. 11/24/15   Everlene FarrierWilliam Floris Neuhaus, PA-C   BP 101/55 mmHg  Pulse 63  Temp(Src) 97.8 F (36.6 C) (Oral)  Resp 16  SpO2 100% Physical Exam  Constitutional: He appears well-developed and well-nourished. No distress.  Non-toxic appearing.   HENT:  Head: Normocephalic and atraumatic.  Right Ear: External ear normal.  Left Ear: External ear normal.  Mouth/Throat: Oropharynx is clear and moist. No oropharyngeal exudate.  Very poor dentition. Multiple dental caries. No evidence of abscess. No discharge from the mouth. No facial swelling.  Uvula is midline without edema. Soft  palate rises symmetrically. No tonsillar hypertrophy or exudates. Tongue protrusion is normal. No trismus. Bilateral tympanic membranes are pearly-gray without erythema or loss of landmarks.   Eyes: Conjunctivae and EOM are normal. Pupils are equal, round, and reactive to light. Right eye exhibits no discharge. Left eye exhibits no discharge.  Neck: Normal range of motion. Neck supple. No JVD present. No tracheal deviation present.  Cardiovascular: Normal rate, regular rhythm, normal heart sounds and intact distal  pulses.   Pulmonary/Chest: Effort normal and breath sounds normal. No respiratory distress.  Lymphadenopathy:    He has no cervical adenopathy.  Neurological: He is alert. Coordination normal.  Skin: Skin is warm and dry. No rash noted. He is not diaphoretic. No erythema. No pallor.  Psychiatric: He has a normal mood and affect. His behavior is normal.  Nursing note and vitals reviewed.   ED Course  Procedures (including critical care time) Labs Review Labs Reviewed - No data to display  Imaging Review No results found.    EKG Interpretation None     Filed Vitals:   11/24/15 1140 11/24/15 1302  BP: 97/71 101/55  Pulse: 99 63  Temp: 98.8 F (37.1 C) 97.8 F (36.6 C)  TempSrc: Oral Oral  Resp: 20 16  SpO2: 98% 100%     MDM   Meds given in ED:  Medications - No data to display  New Prescriptions   NAPROXEN (NAPROSYN) 250 MG TABLET    Take 1 tablet (250 mg total) by mouth 2 (two) times daily with a meal.   PENICILLIN V POTASSIUM (VEETID) 500 MG TABLET    Take 1 tablet (500 mg total) by mouth 4 (four) times daily.    Final diagnoses:  Pain, dental   Patient with chronic ongoing upper dental pain.   No gross abscess.  Exam unconcerning for Ludwig's angina or spread of infection.  Will treat with penicillin and pain medicine.  Urged patient to follow-up with dentist.  I provided dental resource guide. I encouraged him to try seeing the dental clinic at [facility]. I advised the patient to follow-up with their primary care provider this week. I advised the patient to return to the emergency department with new or worsening symptoms or new concerns. The patient verbalized understanding and agreement with plan.      [name], PA-C 11/24/15 1324  Jacalyn Lefevre, MD 11/24/15 [phone]

## 2015-12-28 ENCOUNTER — Emergency Department (HOSPITAL_COMMUNITY)
Admission: EM | Admit: 2015-12-28 | Discharge: 2015-12-28 | Disposition: A | Discharge status: Home / Self Care | Payer: Medicaid Other | Attending: Emergency Medicine | Admitting: Emergency Medicine

## 2015-12-28 ENCOUNTER — Encounter (HOSPITAL_COMMUNITY): Payer: Self-pay | Admitting: Emergency Medicine

## 2015-12-28 DIAGNOSIS — Z139 Encounter for screening, unspecified: Secondary | ICD-10-CM

## 2015-12-28 DIAGNOSIS — K0889 Other specified disorders of teeth and supporting structures: Secondary | ICD-10-CM

## 2015-12-28 DIAGNOSIS — K0510 Chronic gingivitis, plaque induced: Secondary | ICD-10-CM | POA: Insufficient documentation

## 2015-12-28 DIAGNOSIS — F17210 Nicotine dependence, cigarettes, uncomplicated: Secondary | ICD-10-CM | POA: Insufficient documentation

## 2015-12-28 DIAGNOSIS — Z79899 Other long term (current) drug therapy: Secondary | ICD-10-CM | POA: Insufficient documentation

## 2015-12-28 LAB — BASIC METABOLIC PANEL
Anion gap: 4 — ABNORMAL LOW (ref 5–15)
BUN: 12 mg/dL (ref 6–20)
CHLORIDE: 104 mmol/L (ref 101–111)
CO2: 28 mmol/L (ref 22–32)
Calcium: 8.9 mg/dL (ref 8.9–10.3)
Creatinine, Ser: 1.28 mg/dL — ABNORMAL HIGH (ref 0.61–1.24)
GFR calc Af Amer: >60 mL/min (ref >60)
GFR calc non Af Amer: >60 mL/min (ref >60)
GLUCOSE: 106 mg/dL — AB (ref 65–99)
POTASSIUM: 3.8 mmol/L (ref 3.5–5.1)
Sodium: 136 mmol/L (ref 135–145)

## 2015-12-28 LAB — CBC WITH DIFFERENTIAL/PLATELET
BASOS ABS: 0 10*3/uL (ref 0.0–0.1)
Basophils Relative: 1 %
EOS PCT: 1 %
Eosinophils Absolute: 0.1 10*3/uL (ref 0.0–0.7)
HCT: 34.9 % — ABNORMAL LOW (ref 39.0–52.0)
Hemoglobin: 11.9 g/dL — ABNORMAL LOW (ref 13.0–17.0)
LYMPHS PCT: 27 %
Lymphs Abs: 1.6 10*3/uL (ref 0.7–4.0)
MCH: 29.8 pg (ref 26.0–34.0)
MCHC: 34.1 g/dL (ref 30.0–36.0)
MCV: 87.3 fL (ref 78.0–100.0)
Monocytes Absolute: 0.5 10*3/uL (ref 0.1–1.0)
Monocytes Relative: 8 %
Neutro Abs: 3.8 10*3/uL (ref 1.7–7.7)
Neutrophils Relative %: 63 %
Platelets: 229 10*3/uL (ref 150–400)
RBC: 4 MIL/uL — ABNORMAL LOW (ref 4.22–5.81)
RDW: 13.3 % (ref 11.5–15.5)
WBC: 5.9 10*3/uL (ref 4.0–10.5)

## 2015-12-28 MED ORDER — CLINDAMYCIN HCL 150 MG PO CAPS: 150.0000 mg | ORAL_CAPSULE | Freq: Four times a day (QID) | ORAL | Status: DC

## 2015-12-28 MED ORDER — HYDROCODONE-ACETAMINOPHEN 5-325 MG PO TABS: 1.0000 | ORAL_TABLET | ORAL | Status: DC | PRN

## 2015-12-28 NOTE — ED Notes (Signed)
Pt getting labs drawn when called. Will bring pt to room upon completion.

## 2015-12-28 NOTE — ED Provider Notes (Signed)
CSN: [id number]     Arrival date & time 12/28/15  1504 History   First MD Initiated Contact with Patient 12/28/15 1551     Chief Complaint  Patient presents with  . Dental Pain     (Consider location/radiation/quality/duration/timing/severity/associated sxs/prior Treatment) HPI.Vincent KitchenMarland KitchenMarland KitchenPatient is status post entire upper teeth extraction on 12/15/2015 in [place],   [place]. He was prescribed clindamycin at that time, along with Vicodin [18 tablets].   He complains of persistent pain and swelling in the upper gingiva. No fever, sweats, chills, neurodeficits, ear pain, stiff neck.  Past Medical History  Diagnosis Date  . Scoliosis   . PTSD (post-traumatic stress disorder)   . Anxiety   . PTSD (post-traumatic stress disorder)   . Anxiety   . Chronic dental pain   . Seizures (HCC)     benzo-seizure    Past Surgical History  Procedure Laterality Date  . Skin graft    . Nose surgery    . Choanal atresia repair     No family history on file. Social History  Substance Use Topics  . Smoking status: Current Every Day Smoker -- 0.50 packs/day    Types: Cigarettes  . Smokeless tobacco: None  . Alcohol Use: No    Review of Systems  All other systems reviewed and are negative.     Allergies  Review of patient's allergies indicates no known allergies.  Home Medications   Prior to Admission medications   Medication Sig Start Date End Date Taking? Authorizing Provider  acetaminophen (TYLENOL) 500 MG tablet Take 500-1,000 mg by mouth every 6 (six) hours as needed for moderate pain.    Yes Historical Provider, MD  albuterol (PROVENTIL HFA;VENTOLIN HFA) 108 (90 BASE) MCG/ACT inhaler Inhale 2 puffs into the lungs every 6 (six) hours as needed for wheezing or shortness of breath.   Yes Historical Provider, MD  clindamycin (CLEOCIN) 150 MG capsule Take 1 capsule (150 mg total) by mouth every 6 (six) hours. 12/28/15   [name], MD  HYDROcodone-acetaminophen (NORCO/VICODIN) 5-325 MG  tablet Take 1 tablet by mouth every 4 (four) hours as needed. Reported on 12/28/2015 12/15/15   Historical Provider, MD  HYDROcodone-acetaminophen (NORCO/VICODIN) 5-325 MG tablet Take 1-2 tablets by mouth every 4 (four) hours as needed. 12/28/15   [name], MD  naproxen (NAPROSYN) 250 MG tablet Take 1 tablet (250 mg total) by mouth 2 (two) times daily with a meal. Patient not taking: Reported on 12/28/2015 11/24/15   [name], PA-C  penicillin v potassium (VEETID) 500 MG tablet Take 1 tablet (500 mg total) by mouth 4 (four) times daily. Patient not taking: Reported on 12/28/2015 11/24/15   [name], PA-C   BP 104/72 mmHg  Pulse 68  Temp(Src) 98.2 F (36.8 C) (Oral)  Resp 15  Ht 6' (1.829 m)  Wt 150 lb (68.04 kg)  BMI 20.34 kg/m2  SpO2 96% Physical Exam  Constitutional: He is oriented to person, place, and time. He appears well-developed and well-nourished.  HENT:  Head: Normocephalic and atraumatic.  Upper teeth are totally edentulous.  Lower teeth have multiple caries  Eyes: Conjunctivae are normal.  Neck: Neck supple.  Cardiovascular: Normal rate and regular rhythm.   Pulmonary/Chest: Effort normal and breath sounds normal.  Abdominal: Soft. Bowel sounds are normal.  Musculoskeletal: Normal range of motion.  Neurological: He is alert and oriented to person, place, and time.  Skin: Skin is warm and dry.  Psychiatric: He has a normal mood and affect. His behavior  is normal.  Nursing note and vitals reviewed.   ED Course  Procedures (including critical care time) Labs Review Labs Reviewed  CBC WITH DIFFERENTIAL/PLATELET - Abnormal; Notable for the following:    RBC 4.00 (*)    Hemoglobin 11.9 (*)    HCT 34.9 (*)    All other components within normal limits  BASIC METABOLIC PANEL - Abnormal; Notable for the following:    Glucose, Bld 106 (*)    Creatinine, Ser 1.28 (*)    Anion gap 4 (*)    All other components within normal limits    Imaging Review No results  found. I have personally reviewed and evaluated these images and lab results as part of my medical decision-making.   EKG Interpretation None      MDM   Final diagnoses:  Gingivitis    Patient [facility] in [facility]. I reviewed this care plan. Additionally, I reviewed the [facility] database, re his pain medications. Clinically, I feel he deserves pain medication secondary to the acute nature of his dental problem. Discharge medications Norco #20 and clindamycin 150 mg #28.  Referral to local dentist.    Donnetta HutchingBrian Kenzy Campoverde, MD 12/28/15 [phone]

## 2015-12-28 NOTE — ED Notes (Addendum)
Pt c/o LT sided upper and lower dental pain. States on June 28 he had to have all of his top teeth removed due to infection/abscesses. Pt tachycardic in triage. HR 122-125. Pt states he was supposed to have sutures removed 3 days ago.

## 2015-12-28 NOTE — Congregational Nurse Program (Signed)
Congregational Nurse Program Note  Date of Encounter: 12/28/2015  Past Medical History: Past Medical History  Diagnosis Date  . Scoliosis   . PTSD (post-traumatic stress disorder)   . Anxiety   . PTSD (post-traumatic stress disorder)   . Anxiety   . Chronic dental pain   . Seizures (HCC)     benzo-seizure     Encounter Details:     CNP Questionnaire - 12/28/15 1430    Patient Demographics   Is this a new or existing patient? New   Patient is considered a/an Not Applicable   Race Caucasian/White   Patient Assistance   Location of Patient Assistance Not Applicable   Patient's financial/insurance status Low Income   Uninsured Patient Yes   Interventions Counseled to make appt. with provider;Assisted patient in making appt.;Referred to ED/Urgent Care   Patient referred to apply for the following financial assistance [facility]   Food insecurities addressed Not Applicable   Transportation assistance No   Assistance securing medications No   Educational health offerings Chronic disease;Behavioral health;Navigating the healthcare system   Encounter Details   Primary purpose of visit Acute Illness/Condition Visit;Chronic Illness/Condition Visit;Education/Health Concerns;Navigating the Healthcare System;Safety   Was an Emergency Department visit averted? No   Does patient have a medical provider? No   Patient referred to Clinic;Emergency Department;Establish PCP;Area Agency   Was a mental health screening completed? (GAINS tool) No   Does patient have dental issues? Yes   Was a dental referral made? --  referred to [facility] and to Emergency dept and appointment made to establish a PCP   Does patient have vision issues? No   Does your patient have an abnormal blood pressure today? No   Since previous encounter, have you referred patient for abnormal blood pressure that resulted in a new diagnosis or medication change? No   Does your patient have an abnormal  blood glucose today? No   Since previous encounter, have you referred patient for abnormal blood glucose that resulted in a new diagnosis or medication change? No   Was there a life-saving intervention made? No      New client to the [facility]. [facility] by [facility] this client who had shown up there post dental extraction of top teeth and stiches he states still in place. Client complains of upper gum pain with swelling and states that he has not slept in 2 days due to the pain. States he had his teeth extracted 2 days prior to his 42 [address] birthday on 12/[birth year]/15 in [place] and that as of age 71 he no longer has medicaid. Client reports history of "fevers" stating he has ran 99.0 to 100.00 temps. Client very thin and pale, Denies history of drug abuse or ETOH use. He reports smoking cigarettes 4 to 5 per day, but "i'm thinking of quitting". Reports that he has not had a PCP in about 2 years and was seeing a PCP with [facility] at that time that treated him for asthma and PTSD.  States he has a history of being prescribed alprazolam in the past but has not had any in a couple of weeks and that then he took one of his brother's. RN counseled client on the dangers of taking medications that are not prescribed for him and he states understanding. Client states he was taking clindamycin 2 days ago and that he was only given 3 days worth of pain medication. Client asked RN if  I could call him in medications. RN instructed client that she could not write or prescribe medications.  Counseled client on the importance of having a PCP to use for preventative care as well as sick visits. Discussed with client about also connecting with [facility] for dental care and that they would also do a screening. He states understanding. Client wishes to [facility] a [facility] at [facility] [place] and appointment made for 12/29/15 at 9 am and RN will then meet client again for referral for  Mental Health care at that time, client agreeable. Client also counseled on [facility] for evaluation for increased pain and reports of fevers. Upon leaving client's grandfather asked about Housing that the client and his father are homeless and living currently at the [facility], [place]edric Fish[place] of [employer] [place] housing authority called and voicemail left, client's grandfather states they were going directly to the housing authority and wanted directions, directions given and message left for Cedric FishmanFaye Pierce that client was on his way there to start application for housing. Client counseled on keeping his mouth clean and to rinse his mouth and gargle after eating with warm salt water. Client states he understands about good oral hygiene and will do so. Again,  RN noted to client that if he felt worse or had fever or increased pain to seek care at the Emergency Room, client states understanding. Will follow up with client tomorrow after his [facility] with [facility] and that the medical provider then would determine his treatment. Client thankful and left with his father and grandfather.

## 2015-12-28 NOTE — Discharge Instructions (Signed)
I reviewed the [place] drug database. You have a care plan at the [facility] for narcotics. I'm giving you pain medicine because this is an acute problem. Prescription for pain medicine and antibiotic.  Follow-up local dentist. Phone number given.

## 2015-12-29 ENCOUNTER — Ambulatory Visit: Payer: Self-pay | Admitting: Physician Assistant

## 2016-01-05 ENCOUNTER — Ambulatory Visit: Payer: Self-pay | Admitting: Physician Assistant

## 2016-01-06 ENCOUNTER — Encounter: Payer: Self-pay | Admitting: Physician Assistant

## 2016-01-11 ENCOUNTER — Emergency Department (HOSPITAL_COMMUNITY)
Admission: EM | Admit: 2016-01-11 | Discharge: 2016-01-11 | Disposition: A | Discharge status: Home / Self Care | Payer: Medicaid Other | Attending: Emergency Medicine | Admitting: Emergency Medicine

## 2016-01-11 ENCOUNTER — Encounter (HOSPITAL_COMMUNITY): Payer: Self-pay | Admitting: Emergency Medicine

## 2016-01-11 DIAGNOSIS — F17210 Nicotine dependence, cigarettes, uncomplicated: Secondary | ICD-10-CM | POA: Insufficient documentation

## 2016-01-11 DIAGNOSIS — K0889 Other specified disorders of teeth and supporting structures: Secondary | ICD-10-CM

## 2016-01-11 MED ORDER — KETOROLAC TROMETHAMINE 30 MG/ML IJ SOLN
15.0000 mg | Freq: Once | INTRAMUSCULAR | Status: AC
  Administered 2016-01-11: 15 mg via INTRAVENOUS
  Filled 2016-01-11: qty 1

## 2016-01-11 MED ORDER — IBUPROFEN 800 MG PO TABS: 800.0000 mg | ORAL_TABLET | Freq: Three times a day (TID) | ORAL | 0 refills | Status: DC

## 2016-01-11 NOTE — ED Triage Notes (Signed)
PT c/o left upper dental pain x5 days and states has oral surgeon appt 01/26/16.

## 2016-01-11 NOTE — ED Provider Notes (Signed)
[facility] DEPT Provider Note   CSN: [id number] Arrival date & time: 01/11/16  1210  First Provider Contact:  None       History   Chief Complaint Chief Complaint  Patient presents with  . Dental Pain    HPI Vincent Green is a 20 y.o. male.  HPI   20 year old male presents today with complaints of dental pain. Patient reports that he's had extensive dental work with entire upper teeth extracted in June of this year. Patient was placed on clindamycin and given Vicodin. He reports pain continues to persist. He denies any swelling of the mouth, fever chills nausea or vomiting. Full active range of motion of the neck  Past Medical History:  Diagnosis Date  . Anxiety   . Anxiety   . Chronic dental pain   . PTSD (post-traumatic stress disorder)   . PTSD (post-traumatic stress disorder)   . Scoliosis   . Seizures (HCC)    benzo-seizure     There are no active problems to display for this patient.   Past Surgical History:  Procedure Laterality Date  . CHOANAL ATRESIA REPAIR    . NOSE SURGERY    . SKIN GRAFT         Home Medications    Prior to Admission medications   Medication Sig Start Date End Date Taking? Authorizing Provider  acetaminophen (TYLENOL) 500 MG tablet Take 500-1,000 mg by mouth every 6 (six) hours as needed for moderate pain.     Historical Provider, MD  albuterol (PROVENTIL HFA;VENTOLIN HFA) 108 (90 BASE) MCG/ACT inhaler Inhale 2 puffs into the lungs every 6 (six) hours as needed for wheezing or shortness of breath.    Historical Provider, MD  clindamycin (CLEOCIN) 150 MG capsule Take 1 capsule (150 mg total) by mouth every 6 (six) hours. 12/28/15   [name], MD  HYDROcodone-acetaminophen (NORCO/VICODIN) 5-325 MG tablet Take 1-2 tablets by mouth every 4 (four) hours as needed. 12/28/15   [name], MD  ibuprofen (ADVIL,MOTRIN) 800 MG tablet Take 1 tablet (800 mg total) by mouth 3 (three) times daily. 01/11/16   Eyvonne Mechanic, PA-C  naproxen  (NAPROSYN) 250 MG tablet Take 1 tablet (250 mg total) by mouth 2 (two) times daily with a meal. Patient not taking: Reported on 12/28/2015 11/24/15   [name], PA-C  penicillin v potassium (VEETID) 500 MG tablet Take 1 tablet (500 mg total) by mouth 4 (four) times daily. Patient not taking: Reported on 12/28/2015 11/24/15   [name], PA-C    Family History History reviewed. No pertinent family history.  Social History Social History  Substance Use Topics  . Smoking status: Current Every Day Smoker    Packs/day: 0.50    Types: Cigarettes  . Smokeless tobacco: Never Used  . Alcohol use No     Allergies   Review of patient's allergies indicates no known allergies.   Review of Systems Review of Systems  All other systems reviewed and are negative.    Physical Exam Updated Vital Signs BP 120/76 (BP Location: Left Arm)   Pulse 110   Temp 98.6 F (37 C) (Oral)   Resp 16   Ht 6' (1.829 m)   Wt 71.7 kg   SpO2 100%   BMI 21.43 kg/m   Physical Exam  Constitutional: He is oriented to person, place, and time. He appears well-developed and well-nourished. No distress.  HENT:  Head: Normocephalic.  Mouth/Throat: Uvula is midline, oropharynx is clear and moist and mucous membranes  are normal. No oropharyngeal exudate, posterior oropharyngeal edema, posterior oropharyngeal erythema or tonsillar abscesses.  External exam shows no asymmetry of the jaw line or face, no signs of obvious swelling, edema, infection. Full active range of motion of the jaw. Neck is supple with full active range of motion, no tenderness to palpation of the soft tissues  Gumline palpated no obvious signs of infection including warmth, redness, abscess, tenderness. Posterior oropharynx clear with no signs of infection, uvula is midline and rises with phonation, tonsils present and normal in size, symmetrical bilateral, tongue is normal soft touch with full active range of motion, floor mouth is soft  nontender.  Complete upper dental extraction  Eyes: Conjunctivae are normal. Pupils are equal, round, and reactive to light. Right eye exhibits no discharge. Left eye exhibits no discharge.  Neck: Normal range of motion. Neck supple. No JVD present. No tracheal deviation present. No thyromegaly present.  Pulmonary/Chest: No stridor.  Lymphadenopathy:    He has no cervical adenopathy.  Neurological: He is alert and oriented to person, place, and time.  Skin: Skin is warm and dry. No rash noted. He is not diaphoretic. No erythema. No pallor.  Psychiatric: He has a normal mood and affect. His behavior is normal. Judgment and thought content normal.  Nursing note and vitals reviewed.  ED Treatments / Results  Labs (all labs ordered are listed, but only abnormal results are displayed) Labs Reviewed - No data to display  EKG  EKG Interpretation None       Radiology No results found.  Procedures Procedures (including critical care time)  Medications Ordered in ED Medications  ketorolac (TORADOL) 30 MG/ML injection 15 mg (not administered)     Initial Impression / Assessment and Plan / ED Course  I have reviewed the triage vital signs and the nursing notes.  Pertinent labs & imaging results that were available during my care of the patient were reviewed by me and considered in my medical decision making (see chart for details).  Clinical Course     Final Clinical Impressions(s) / ED Diagnoses   Final diagnoses:  Pain, dental    Labs:  Imaging:  Consults:  Therapeutics:  Discharge Meds:   Assessment/Plan:  20 year old male with an unconjugated dental pain, Toradol here, ibuprofen home, dental follow-up.      New Prescriptions New Prescriptions   IBUPROFEN (ADVIL,MOTRIN) 800 MG TABLET    Take 1 tablet (800 mg total) by mouth 3 (three) times daily.     Eyvonne Mechanic, PA-C 01/11/16 1440    Bethann Berkshire, MD 01/12/16 Rickey Primus

## 2016-04-28 ENCOUNTER — Emergency Department (HOSPITAL_COMMUNITY): Payer: Medicaid Other

## 2016-04-28 ENCOUNTER — Encounter (HOSPITAL_COMMUNITY): Payer: Self-pay | Admitting: Emergency Medicine

## 2016-04-28 ENCOUNTER — Emergency Department (HOSPITAL_COMMUNITY)
Admission: EM | Admit: 2016-04-28 | Discharge: 2016-04-28 | Disposition: A | Discharge status: Home / Self Care | Payer: Medicaid Other | Attending: Emergency Medicine | Admitting: Emergency Medicine

## 2016-04-28 DIAGNOSIS — J209 Acute bronchitis, unspecified: Secondary | ICD-10-CM | POA: Insufficient documentation

## 2016-04-28 DIAGNOSIS — F17210 Nicotine dependence, cigarettes, uncomplicated: Secondary | ICD-10-CM | POA: Insufficient documentation

## 2016-04-28 DIAGNOSIS — Z792 Long term (current) use of antibiotics: Secondary | ICD-10-CM | POA: Insufficient documentation

## 2016-04-28 DIAGNOSIS — Z79899 Other long term (current) drug therapy: Secondary | ICD-10-CM | POA: Insufficient documentation

## 2016-04-28 MED ORDER — PROMETHAZINE-CODEINE 6.25-10 MG/5ML PO SYRP
5.0000 mL | ORAL_SOLUTION | ORAL | 0 refills | Status: DC | PRN
Start: 2016-04-28 — End: 2016-11-22

## 2016-04-28 MED ORDER — AZITHROMYCIN 250 MG PO TABS
500.0000 mg | ORAL_TABLET | Freq: Once | ORAL | Status: AC
  Administered 2016-04-28: 500 mg via ORAL
  Filled 2016-04-28: qty 2

## 2016-04-28 MED ORDER — ALBUTEROL SULFATE HFA 108 (90 BASE) MCG/ACT IN AERS: 1.0000 | INHALATION_SPRAY | Freq: Four times a day (QID) | RESPIRATORY_TRACT | 0 refills | Status: DC | PRN

## 2016-04-28 MED ORDER — AZITHROMYCIN 250 MG PO TABS: ORAL_TABLET | ORAL | 0 refills | Status: DC

## 2016-04-28 NOTE — Discharge Instructions (Signed)
Take your next dose of Zithromax tomorrow evening.  Rest and make sure you our drinking plenty of fluids.

## 2016-04-28 NOTE — ED Notes (Signed)
Pt alert & oriented x4, stable gait. Patient given discharge instructions, paperwork & prescription(s). Patient  instructed to stop at the registration desk to finish any additional paperwork. Patient verbalized understanding. Pt left department w/ no further questions. 

## 2016-04-28 NOTE — ED Provider Notes (Signed)
[facility] Provider Note   CSN: [id number] Arrival date & time: 04/28/16  1647     History   Chief Complaint Chief Complaint  Patient presents with  . Cough    HPI Vincent Green is a 20 y.o. male presenting with a 5 day history of uri type symptoms which includes nasal congestion with clear rhinorrhea, non productive cough, sore throat and low grade fever.  Symptoms do not include shortness of breath, chest pain,  Nausea, vomiting or diarrhea.  The patient has taken mucinex and phenylephrine prior to arrival with no significant improvement in symptoms. .  The history is provided by the patient and a parent.    Past Medical History:  Diagnosis Date  . Anxiety   . Anxiety   . Chronic dental pain   . PTSD (post-traumatic stress disorder)   . PTSD (post-traumatic stress disorder)   . Scoliosis   . Seizures (HCC)    benzo-seizure     There are no active problems to display for this patient.   Past Surgical History:  Procedure Laterality Date  . CHOANAL ATRESIA REPAIR    . NOSE SURGERY    . SKIN GRAFT         Home Medications    Prior to Admission medications   Medication Sig Start Date End Date Taking? Authorizing Provider  acetaminophen (TYLENOL) 500 MG tablet Take 500-1,000 mg by mouth every 6 (six) hours as needed for moderate pain.     Historical Provider, MD  albuterol (PROVENTIL HFA;VENTOLIN HFA) 108 (90 Base) MCG/ACT inhaler Inhale 1-2 puffs into the lungs every 6 (six) hours as needed for wheezing or shortness of breath. 04/28/16   Burgess AmorJulie Piedad Standiford, PA-C  azithromycin (ZITHROMAX Z-PAK) 250 MG tablet 1 tablet PO daily for 4 days 04/29/16   Burgess AmorJulie Harlie Ragle, PA-C  clindamycin (CLEOCIN) 150 MG capsule Take 1 capsule (150 mg total) by mouth every 6 (six) hours. 12/28/15   Donnetta HutchingBrian Cook, MD  HYDROcodone-acetaminophen (NORCO/VICODIN) 5-325 MG tablet Take 1-2 tablets by mouth every 4 (four) hours as needed. 12/28/15   Donnetta HutchingBrian Cook, MD  ibuprofen (ADVIL,MOTRIN) 800 MG  tablet Take 1 tablet (800 mg total) by mouth 3 (three) times daily. 01/11/16   Eyvonne MechanicJeffrey Hedges, PA-C  naproxen (NAPROSYN) 250 MG tablet Take 1 tablet (250 mg total) by mouth 2 (two) times daily with a meal. Patient not taking: Reported on 12/28/2015 11/24/15   Everlene FarrierWilliam Dansie, PA-C  penicillin v potassium (VEETID) 500 MG tablet Take 1 tablet (500 mg total) by mouth 4 (four) times daily. Patient not taking: Reported on 12/28/2015 11/24/15   Everlene FarrierWilliam Dansie, PA-C  promethazine-codeine [facility](PHENERGAN WITH CODEINE) 6.25-10 MG/5ML syrup Take 5 mLs by mouth every 4 (four) hours as needed for cough. 04/28/16   Burgess AmorJulie Rhys Lichty, PA-C    Family History History reviewed. No pertinent family history.  Social History Social History  Substance Use Topics  . Smoking status: Current Every Day Smoker    Packs/day: 0.50    Types: Cigarettes  . Smokeless tobacco: Never Used  . Alcohol use No     Allergies   Tessalon [benzonatate]   Review of Systems Review of Systems  Constitutional: Positive for fever. Negative for chills.  HENT: Positive for congestion, rhinorrhea and sore throat. Negative for ear pain, sinus pressure, trouble swallowing and voice change.   Eyes: Negative for discharge.  Respiratory: Positive for cough. Negative for shortness of breath, wheezing and stridor.   Cardiovascular: Negative for chest pain.  Gastrointestinal: Negative for  abdominal pain.  Genitourinary: Negative.      Physical Exam Updated Vital Signs BP 118/75 (BP Location: Left Arm)   Pulse 102   Temp 98.4 F (36.9 C) (Oral)   Resp 18   Ht 6' (1.829 m)   Wt 72.6 kg   SpO2 98%   BMI 21.70 kg/m   Physical Exam  Constitutional: He is oriented to person, place, and time. He appears well-developed and well-nourished.  HENT:  Head: Normocephalic and atraumatic.  Right Ear: Tympanic membrane and ear canal normal.  Left Ear: Tympanic membrane and ear canal normal.  Nose: Mucosal edema and rhinorrhea present.  Mouth/Throat:  Uvula is midline, oropharynx is clear and moist and mucous membranes are normal. No oropharyngeal exudate, posterior oropharyngeal edema, posterior oropharyngeal erythema or tonsillar abscesses.  Eyes: Conjunctivae are normal.  Cardiovascular: Normal rate and normal heart sounds.   Pulmonary/Chest: Effort normal. No respiratory distress. He has no wheezes. He has no rales.  Coarse breath sounds throughout without wheezing or rhonchi.  Abdominal: Soft. There is no tenderness.  Musculoskeletal: Normal range of motion.  Neurological: He is alert and oriented to person, place, and time.  Skin: Skin is warm and dry. No rash noted.  Psychiatric: He has a normal mood and affect.     ED Treatments / Results  Labs (all labs ordered are listed, but only abnormal results are displayed) Labs Reviewed - No data to display  EKG  EKG Interpretation None       Radiology Dg Chest 2 View  Result Date: 04/28/2016 CLINICAL DATA:  COUGH, Pt reports cough, sore throat and headache x 5 days HISTORY OF SCOLIOSIS, PTSD, SEIZURES EXAM: CHEST  2 VIEW COMPARISON:  03/01/2016 FINDINGS: Normal mediastinum and cardiac silhouette. Normal pulmonary vasculature. No evidence of effusion, infiltrate, or pneumothorax. No acute bony abnormality. IMPRESSION: Normal chest radiograph or chest radiograph. Electronically Signed   By: Genevive BiStewart  Edmunds M.D.   On: 04/28/2016 17:44    Procedures Procedures (including critical care time)  Medications Ordered in ED Medications  azithromycin (ZITHROMAX) tablet 500 mg (500 mg Oral Given 04/28/16 2026)     Initial Impression / Assessment and Plan / ED Course  I have reviewed the triage vital signs and the nursing notes.  Pertinent labs & imaging results that were available during my care of the patient were reviewed by me and considered in my medical decision making (see chart for details).  Clinical Course     Pt with probable viral bronchitis, no respiratory  distress.  Will cover with z pack given smoker status. Phenergan/codeine for cough sx, rest, increased fluid intake, recheck by pcp if sx persist/worsen.  The patient appears reasonably screened and/or stabilized for discharge and I doubt any other medical condition or other [facility] requiring further screening, evaluation, or treatment in the ED at this time prior to discharge.   Final Clinical Impressions(s) / ED Diagnoses   Final diagnoses:  Acute bronchitis, unspecified organism    New Prescriptions Discharge Medication List as of 04/28/2016  8:18 PM    START taking these medications   Details  azithromycin (ZITHROMAX Z-PAK) 250 MG tablet 1 tablet PO daily for 4 days, Print    promethazine-codeine (PHENERGAN WITH CODEINE) 6.25-10 MG/5ML syrup Take 5 mLs by mouth every 4 (four) hours as needed for cough., Starting Fri 04/28/2016, Print         Burgess AmorJulie Shelagh Rayman, PA-C 04/29/16 1306    Donnetta HutchingBrian Cook, MD 04/29/16 1547

## 2016-04-28 NOTE — ED Triage Notes (Signed)
Pt reports cough, sore throat and headache x 5 days.

## 2016-05-01 ENCOUNTER — Emergency Department (HOSPITAL_COMMUNITY)
Admission: EM | Admit: 2016-05-01 | Discharge: 2016-05-01 | Disposition: A | Discharge status: Home / Self Care | Payer: Medicaid Other | Attending: Emergency Medicine | Admitting: Emergency Medicine

## 2016-05-01 ENCOUNTER — Encounter (HOSPITAL_COMMUNITY): Payer: Self-pay | Admitting: Emergency Medicine

## 2016-05-01 DIAGNOSIS — Z792 Long term (current) use of antibiotics: Secondary | ICD-10-CM | POA: Insufficient documentation

## 2016-05-01 DIAGNOSIS — Z791 Long term (current) use of non-steroidal anti-inflammatories (NSAID): Secondary | ICD-10-CM | POA: Insufficient documentation

## 2016-05-01 DIAGNOSIS — F17210 Nicotine dependence, cigarettes, uncomplicated: Secondary | ICD-10-CM | POA: Insufficient documentation

## 2016-05-01 DIAGNOSIS — J209 Acute bronchitis, unspecified: Secondary | ICD-10-CM | POA: Insufficient documentation

## 2016-05-01 DIAGNOSIS — Z760 Encounter for issue of repeat prescription: Secondary | ICD-10-CM

## 2016-05-01 DIAGNOSIS — Z79899 Other long term (current) drug therapy: Secondary | ICD-10-CM | POA: Insufficient documentation

## 2016-05-01 MED ORDER — PROMETHAZINE HCL 25 MG PO TABS: 25.0000 mg | ORAL_TABLET | Freq: Four times a day (QID) | ORAL | 0 refills | Status: DC | PRN

## 2016-05-01 MED ORDER — PSEUDOEPH-BROMPHEN-DM 30-2-10 MG/5ML PO SYRP: 10.0000 mL | ORAL_SOLUTION | Freq: Four times a day (QID) | ORAL | 0 refills | Status: DC | PRN

## 2016-05-01 NOTE — ED Provider Notes (Signed)
[facility] Provider Note   CSN: [id number] Arrival date & time: 05/01/16  1702  By signing my name below, I, Emmanuella Mensah, attest that this documentation has been prepared under the direction and in the presence of Burgess AmorJulie Osmany Azer, PA-C. Electronically Signed: Angelene GiovanniEmmanuella Mensah, ED Scribe. 05/01/16. 6:43 PM.   History   Chief Complaint Chief Complaint  Patient presents with  . Medication Refill    HPI Comments: Vincent Green is a 20 y.o. male who presents to the Emergency Department requesting medication refill. Pt was evaluated 3 days ago for URI symptoms where he had associated nasal congestion with clear rhinorrhea, non-productive cough, sore throat, and a low grade fever. He received a z pack and phenergan with codeine for cough. He states that he is here today because he spilled his phenergan with codeine in the sink as he was trying to measure it over the sink. He has an allergy to Tessalon. He reports that his cough is progressing despite being complaint with his z-pack and now has a generalized headache and nausea. No sick contacts noted. He denies any fever, chills, shortness of breath, chest pain, wheezing, or any other symptoms.   The history is provided by the patient. No language interpreter was used.    Past Medical History:  Diagnosis Date  . Anxiety   . Anxiety   . Chronic dental pain   . PTSD (post-traumatic stress disorder)   . PTSD (post-traumatic stress disorder)   . Scoliosis   . Seizures (HCC)    benzo-seizure     There are no active problems to display for this patient.   Past Surgical History:  Procedure Laterality Date  . CHOANAL ATRESIA REPAIR    . NOSE SURGERY    . SKIN GRAFT         Home Medications    Prior to Admission medications   Medication Sig Start Date End Date Taking? Authorizing Provider  acetaminophen (TYLENOL) 500 MG tablet Take 500-1,000 mg by mouth every 6 (six) hours as needed for moderate pain.     Historical  Provider, MD  albuterol (PROVENTIL HFA;VENTOLIN HFA) 108 (90 Base) MCG/ACT inhaler Inhale 1-2 puffs into the lungs every 6 (six) hours as needed for wheezing or shortness of breath. 04/28/16   Burgess AmorJulie Tyonna Talerico, PA-C  azithromycin (ZITHROMAX Z-PAK) 250 MG tablet 1 tablet PO daily for 4 days 04/29/16   Burgess AmorJulie Quadarius Henton, PA-C  brompheniramine-pseudoephedrine-DM 30-2-10 MG/5ML syrup Take 10 mLs by mouth 4 (four) times daily as needed (cough). 05/01/16   Burgess AmorJulie Sadia Belfiore, PA-C  clindamycin (CLEOCIN) 150 MG capsule Take 1 capsule (150 mg total) by mouth every 6 (six) hours. 12/28/15   Donnetta HutchingBrian Cook, MD  HYDROcodone-acetaminophen (NORCO/VICODIN) 5-325 MG tablet Take 1-2 tablets by mouth every 4 (four) hours as needed. 12/28/15   Donnetta HutchingBrian Cook, MD  ibuprofen (ADVIL,MOTRIN) 800 MG tablet Take 1 tablet (800 mg total) by mouth 3 (three) times daily. 01/11/16   Eyvonne MechanicJeffrey Hedges, PA-C  naproxen (NAPROSYN) 250 MG tablet Take 1 tablet (250 mg total) by mouth 2 (two) times daily with a meal. Patient not taking: Reported on 12/28/2015 11/24/15   Everlene FarrierWilliam Dansie, PA-C  penicillin v potassium (VEETID) 500 MG tablet Take 1 tablet (500 mg total) by mouth 4 (four) times daily. Patient not taking: Reported on 12/28/2015 11/24/15   Everlene FarrierWilliam Dansie, PA-C  promethazine (PHENERGAN) 25 MG tablet Take 1 tablet (25 mg total) by mouth every 6 (six) hours as needed for nausea or vomiting. 05/01/16   Raynelle FanningJulie  Zyanna Leisinger, PA-C  promethazine-codeine (PHENERGAN WITH CODEINE) 6.25-10 MG/5ML syrup Take 5 mLs by mouth every 4 (four) hours as needed for cough. 04/28/16   Burgess Amor, PA-C    Family History History reviewed. No pertinent family history.  Social History Social History  Substance Use Topics  . Smoking status: Current Every Day Smoker    Packs/day: 0.50    Types: Cigarettes  . Smokeless tobacco: Never Used  . Alcohol use No     Allergies   Tessalon [benzonatate]   Review of Systems Review of Systems  Constitutional: Negative for chills and fever.  HENT:  Positive for congestion.   Respiratory: Positive for cough. Negative for shortness of breath and wheezing.   Cardiovascular: Negative for chest pain.     Physical Exam Updated Vital Signs BP (!) 145/108 (BP Location: Left Arm)   Pulse 112   Temp 98.5 F (36.9 C) (Oral)   Resp 20   Ht 6' (1.829 m)   Wt 72.6 kg   SpO2 99%   BMI 21.70 kg/m   Physical Exam  Constitutional: He is oriented to person, place, and time. He appears well-developed and well-nourished. No distress.  HENT:  Head: Normocephalic and atraumatic.  Eyes: Conjunctivae and EOM are normal.  Neck: Neck supple. No tracheal deviation present.  Cardiovascular: Normal rate.   Pulmonary/Chest: Effort normal. No respiratory distress. He has no wheezes. He has no rales.  Frequent dry cough during exam  Musculoskeletal: Normal range of motion.  Lymphadenopathy:    He has no cervical adenopathy.  Neurological: He is alert and oriented to person, place, and time.  Skin: Skin is warm and dry.  Psychiatric: He has a normal mood and affect. His behavior is normal.  Nursing note and vitals reviewed.    ED Treatments / Results  DIAGNOSTIC STUDIES: Oxygen Saturation is 99% on RA, normal by my interpretation.    COORDINATION OF CARE: 6:42 PM- Pt advised of plan for treatment and pt agrees. Explained to pt that he cannot receive a refill of a narcotic medication. He will receive phenergan tablet and brompheniramine-pseudoephedrine-DM syrup.    Labs (all labs ordered are listed, but only abnormal results are displayed) Labs Reviewed - No data to display  EKG  EKG Interpretation None       Radiology No results found.  Procedures Procedures (including critical care time)  Medications Ordered in ED Medications - No data to display   Initial Impression / Assessment and Plan / ED Course  Burgess Amor, PA-C has reviewed the triage vital signs and the nursing notes.  Pertinent labs & imaging results that were  available during my care of the patient were reviewed by me and considered in my medical decision making (see chart for details).  Clinical Course     Advised pt that I cannot replace narcotic prescriptions.  He was prescribed phenergan tabs given nausea and non narcotic cough med.  Exam reassuring, no concerns for pneumonia, lungs CTAB. No wheezing.  He was given albuterol mdi at visit 2 days ago, still taking zpack prescribed.  Final Clinical Impressions(s) / ED Diagnoses   Final diagnoses:  Acute bronchitis, unspecified organism  Encounter for medication refill    New Prescriptions Discharge Medication List as of 05/01/2016  6:53 PM    START taking these medications   Details  brompheniramine-pseudoephedrine-DM 30-2-10 MG/5ML syrup Take 10 mLs by mouth 4 (four) times daily as needed (cough)., Starting Mon 05/01/2016, Print    promethazine (PHENERGAN) 25 MG  tablet Take 1 tablet (25 mg total) by mouth every 6 (six) hours as needed for nausea or vomiting., Starting Mon 05/01/2016, Print       I personally performed the services described in this documentation, which was scribed in my presence. The recorded information has been reviewed and is accurate.    Burgess AmorJulie Gilberte Gorley, PA-C 05/02/16 1233    Maia PlanJoshua G Long, MD 05/02/16 [phone]

## 2016-05-01 NOTE — ED Triage Notes (Signed)
Pt reports he was given a prescription for cough medicine. Pt states he dropped the medication in the sink and it "all spilled out." Pt here requesting refill.

## 2016-10-01 ENCOUNTER — Emergency Department (HOSPITAL_COMMUNITY)
Admission: EM | Admit: 2016-10-01 | Discharge: 2016-10-01 | Disposition: A | Discharge status: Home / Self Care | Payer: Medicaid Other | Attending: Emergency Medicine | Admitting: Emergency Medicine

## 2016-10-01 ENCOUNTER — Encounter (HOSPITAL_COMMUNITY): Payer: Self-pay | Admitting: Emergency Medicine

## 2016-10-01 DIAGNOSIS — Z791 Long term (current) use of non-steroidal anti-inflammatories (NSAID): Secondary | ICD-10-CM | POA: Insufficient documentation

## 2016-10-01 DIAGNOSIS — F17210 Nicotine dependence, cigarettes, uncomplicated: Secondary | ICD-10-CM | POA: Insufficient documentation

## 2016-10-01 DIAGNOSIS — Z79899 Other long term (current) drug therapy: Secondary | ICD-10-CM | POA: Insufficient documentation

## 2016-10-01 DIAGNOSIS — R509 Fever, unspecified: Secondary | ICD-10-CM | POA: Insufficient documentation

## 2016-10-01 DIAGNOSIS — M549 Dorsalgia, unspecified: Secondary | ICD-10-CM | POA: Insufficient documentation

## 2016-10-01 DIAGNOSIS — R630 Anorexia: Secondary | ICD-10-CM | POA: Insufficient documentation

## 2016-10-01 DIAGNOSIS — R112 Nausea with vomiting, unspecified: Secondary | ICD-10-CM | POA: Insufficient documentation

## 2016-10-01 MED ORDER — ONDANSETRON 4 MG PO TBDP
4.0000 mg | ORAL_TABLET | Freq: Once | ORAL | Status: AC | PRN
  Administered 2016-10-01: 4 mg via ORAL
  Filled 2016-10-01: qty 1

## 2016-10-01 NOTE — ED Notes (Signed)
Pt declined labs, fluids, radiology  Requested something for pain and when denied narcotics left

## 2016-10-01 NOTE — ED Notes (Signed)
PA in to assess 

## 2016-10-01 NOTE — ED Notes (Signed)
Pt refuses his blood draw for labs- when queried, he reports that he has PTSD-

## 2016-10-01 NOTE — ED Triage Notes (Signed)
PT c/o constant nausea and vomiting x3 days with chronic middle back pain. PT denies any urinary symptoms. PT states he had BM x2 days ago.

## 2016-10-01 NOTE — ED Provider Notes (Signed)
[facility]-EMERGENCY DEPT Provider Note   CSN: [id number] Arrival date & time: 10/01/16  1553  By signing my name below, I, [name], attest that this documentation has been prepared under the direction and in the presence of Ok Edwards, PA-C. Electronically Signed: [name], ED Scribe. 10/01/16. 5:31 PM.   History   Chief Complaint Chief Complaint  Patient presents with  . Emesis    HPI Vincent Green is a 21 y.o. male with a PMHx of chronic back pain and PTSD, who presents to the Emergency Department complaining of constant vomiting for the last three days. Associated sx include nausea, subjective fever, appetite change and a dry cough. Zofran did not alleviate his sx. Additionally, pt c/o back pain. He notes not having any more medication for his back.   The history is provided by the patient. No language interpreter was used.    Past Medical History:  Diagnosis Date  . Anxiety   . Anxiety   . Chronic dental pain   . PTSD (post-traumatic stress disorder)   . PTSD (post-traumatic stress disorder)   . Scoliosis   . Seizures (HCC)    benzo-seizure     There are no active problems to display for this patient.   Past Surgical History:  Procedure Laterality Date  . CHOANAL ATRESIA REPAIR    . NOSE SURGERY    . SKIN GRAFT         Home Medications    Prior to Admission medications   Medication Sig Start Date End Date Taking? Authorizing Provider  acetaminophen (TYLENOL) 500 MG tablet Take 500-1,000 mg by mouth every 6 (six) hours as needed for moderate pain.     Historical Provider, MD  albuterol (PROVENTIL HFA;VENTOLIN HFA) 108 (90 Base) MCG/ACT inhaler Inhale 1-2 puffs into the lungs every 6 (six) hours as needed for wheezing or shortness of breath. 04/28/16   Burgess Amor, PA-C  azithromycin (ZITHROMAX Z-PAK) 250 MG tablet 1 tablet PO daily for 4 days 04/29/16   Burgess Amor, PA-C  brompheniramine-pseudoephedrine-DM 30-2-10 MG/5ML syrup Take 10 mLs by  mouth 4 (four) times daily as needed (cough). 05/01/16   Burgess Amor, PA-C  clindamycin (CLEOCIN) 150 MG capsule Take 1 capsule (150 mg total) by mouth every 6 (six) hours. 12/28/15   [name], MD  HYDROcodone-acetaminophen (NORCO/VICODIN) 5-325 MG tablet Take 1-2 tablets by mouth every 4 (four) hours as needed. 12/28/15   [name], MD  ibuprofen (ADVIL,MOTRIN) 800 MG tablet Take 1 tablet (800 mg total) by mouth 3 (three) times daily. 01/11/16   Eyvonne Mechanic, PA-C  naproxen (NAPROSYN) 250 MG tablet Take 1 tablet (250 mg total) by mouth 2 (two) times daily with a meal. Patient not taking: Reported on 12/28/2015 11/24/15   [name], PA-C  penicillin v potassium (VEETID) 500 MG tablet Take 1 tablet (500 mg total) by mouth 4 (four) times daily. Patient not taking: Reported on 12/28/2015 11/24/15   [name], PA-C  promethazine (PHENERGAN) 25 MG tablet Take 1 tablet (25 mg total) by mouth every 6 (six) hours as needed for nausea or vomiting. 05/01/16   Burgess Amor, PA-C  promethazine-codeine (PHENERGAN WITH CODEINE) 6.25-10 MG/5ML syrup Take 5 mLs by mouth every 4 (four) hours as needed for cough. 04/28/16   Burgess Amor, PA-C    Family History History reviewed. No pertinent family history.  Social History Social History  Substance Use Topics  . Smoking status: Current Every Day Smoker    Packs/day: 0.50  Types: Cigarettes  . Smokeless tobacco: Never Used  . Alcohol use No     Allergies   Tessalon [benzonatate]   Review of Systems Review of Systems  Constitutional: Positive for appetite change and fever.  Gastrointestinal: Positive for nausea and vomiting.  Musculoskeletal: Positive for back pain.  All other systems reviewed and are negative.    Physical Exam Updated Vital Signs BP 117/86 (BP Location: Right Arm)   Pulse (!) 122   Temp 99.2 F (37.3 C) (Oral)   Resp 18   Ht 6' (1.829 m)   Wt 158 lb (71.7 kg)   SpO2 100%   BMI 21.43 kg/m   Physical Exam    Constitutional: He appears well-developed and well-nourished. No distress.  HENT:  Head: Normocephalic and atraumatic.  Neck: Neck supple.  Cardiovascular: Normal rate, regular rhythm and normal heart sounds.   No murmur heard. Pulmonary/Chest: Effort normal and breath sounds normal. No respiratory distress. He has no wheezes. He has no rales.  Musculoskeletal: Normal range of motion.  Neurological: He is alert.  Skin: Skin is warm and dry.  Nursing note and vitals reviewed.    ED Treatments / Results  DIAGNOSTIC STUDIES: Oxygen Saturation is 100% on RA, normal by my interpretation.   COORDINATION OF CARE: 5:17 PM-Discussed next steps with pt. Pt verbalized understanding and is agreeable with the plan.    Labs (all labs ordered are listed, but only abnormal results are displayed) Labs Reviewed  LIPASE, BLOOD  COMPREHENSIVE METABOLIC PANEL  CBC  URINALYSIS, ROUTINE W REFLEX MICROSCOPIC    EKG  EKG Interpretation None       Radiology No results found.  Procedures Procedures (including critical care time)  Medications Ordered in ED Medications  ondansetron (ZOFRAN-ODT) disintegrating tablet 4 mg (4 mg Oral Given 10/01/16 1616)     Initial Impression / Assessment and Plan / ED Course  I have reviewed the triage vital signs and the nursing notes.  Pertinent labs & imaging results that were available during my care of the patient were reviewed by me and considered in my medical decision making (see chart for details).     Pt refused IV, blood work and urine.  Pt refuses chest xray.  He reports zofran did not help. He refuses any other medications.  Pt states he wants to leave.  He refuses any care  Final Clinical Impressions(s) / ED Diagnoses   Final diagnoses:  Nausea and vomiting, intractability of vomiting not specified, unspecified vomiting type    New Prescriptions Discharge Medication List as of 10/01/2016  5:22 PM     An After Visit Summary was  printed and given to the patient.  I personally performed the services in this documentation, which was scribed in my presence.  The recorded information has been reviewed and considered.   Barnet Pall.     Vincent Green [place], PA-C 10/01/16 2019    Linwood Dibbles, MD 10/02/16 [phone]

## 2016-10-01 NOTE — ED Notes (Addendum)
Pt reports back pain, N/V, as well as inability to eat- Speaks of "puddles of vomit, that has clear water".  He does not make eye contact, speaks of inability to control pain with flexeril  And cannot remember his physician who prescribed his meds  He also refused labs in triage

## 2016-10-03 ENCOUNTER — Encounter: Payer: Self-pay | Admitting: Gastroenterology

## 2016-10-31 ENCOUNTER — Other Ambulatory Visit: Payer: Self-pay

## 2016-10-31 ENCOUNTER — Ambulatory Visit: Payer: Medicaid Other | Admitting: Gastroenterology

## 2016-11-11 ENCOUNTER — Emergency Department (HOSPITAL_COMMUNITY)
Admission: EM | Admit: 2016-11-11 | Discharge: 2016-11-11 | Disposition: A | Discharge status: Home / Self Care | Payer: Medicaid Other | Attending: Emergency Medicine | Admitting: Emergency Medicine

## 2016-11-11 ENCOUNTER — Encounter (HOSPITAL_COMMUNITY): Payer: Self-pay | Admitting: Emergency Medicine

## 2016-11-11 ENCOUNTER — Encounter (HOSPITAL_COMMUNITY): Payer: Self-pay | Admitting: *Deleted

## 2016-11-11 DIAGNOSIS — K0889 Other specified disorders of teeth and supporting structures: Secondary | ICD-10-CM

## 2016-11-11 DIAGNOSIS — K029 Dental caries, unspecified: Secondary | ICD-10-CM | POA: Insufficient documentation

## 2016-11-11 DIAGNOSIS — Z79899 Other long term (current) drug therapy: Secondary | ICD-10-CM | POA: Insufficient documentation

## 2016-11-11 DIAGNOSIS — F17210 Nicotine dependence, cigarettes, uncomplicated: Secondary | ICD-10-CM | POA: Insufficient documentation

## 2016-11-11 MED ORDER — KETOROLAC TROMETHAMINE 60 MG/2ML IM SOLN
60.0000 mg | Freq: Once | INTRAMUSCULAR | Status: AC
  Administered 2016-11-11: 60 mg via INTRAMUSCULAR
  Filled 2016-11-11: qty 2

## 2016-11-11 MED ORDER — PENICILLIN V POTASSIUM 500 MG PO TABS: 500.0000 mg | ORAL_TABLET | Freq: Three times a day (TID) | ORAL | 0 refills | Status: DC

## 2016-11-11 MED ORDER — KETOROLAC TROMETHAMINE 10 MG PO TABS: 10.0000 mg | ORAL_TABLET | Freq: Four times a day (QID) | ORAL | 0 refills | Status: DC | PRN

## 2016-11-11 MED ORDER — PENICILLIN V POTASSIUM 500 MG PO TABS: 500.0000 mg | ORAL_TABLET | Freq: Three times a day (TID) | ORAL | 0 refills | Status: AC

## 2016-11-11 MED ORDER — NAPROXEN 500 MG PO TABS: 500.0000 mg | ORAL_TABLET | Freq: Two times a day (BID) | ORAL | 0 refills | Status: DC

## 2016-11-11 NOTE — Progress Notes (Signed)
Asked to speak with patient regarding his complaint of not receiving the proper medicine for his nerve pain from his teeth.  Pt showed me on the lower right and upper left of his mouth the areas that are causing him misery.  I explained to him the purpose of the antibodic and antiinflammatory.  He said he understood, he had used otc numbing meds and these meds in past without success.  He really needed something for pain until he could talk to his dentist to get his appointment changed.  He asked to speak to a doctor.  I told him I would talk with Dr. Adriana Simasook, which I did and he said he was sticking to the care plan.  I relayed this to the patient, he said that his aggression to the staff was misinterpreted, got up, tried to remove his hospital bracelet and left.  I tried to give his prescriptions and he said that is not what he needed.

## 2016-11-11 NOTE — ED Provider Notes (Signed)
Medical screening examination/treatment/procedure(s) were conducted as a shared visit with non-physician practitioner(s) and myself.  I personally evaluated the patient during the encounter.  Pt asked to see attending MD.  Explained to him that we will not be giving him opiate pain medications because of the additive concerns with those drugs.  Will try alternative regimens, NSAIDS.   Linwood DibblesKnapp, Aryelle Figg, MD 11/11/16 2021

## 2016-11-11 NOTE — ED Notes (Signed)
Dr.Knapp at bedside  

## 2016-11-11 NOTE — ED Notes (Signed)
Patient left at this time with all belongings. 

## 2016-11-11 NOTE — ED Triage Notes (Signed)
To ED for eval of dental pain. States he hasn't been to sleep in 2 days due to pain. Pt [facility] but cant stand the pain. Poor mouth hygiene.

## 2016-11-11 NOTE — ED Notes (Addendum)
Pt states that he wants to file a grievance due to not being able to speak with a doctor.  PA Allean FoundJoshua Gieple spoke with patient and has also spoken with Dr. Adriana Simasook about patient's condition.  Pt wants to speak to doctor and is asking for Burgess AmorJulie Idol PA by name.  He wants something stronger for pain.  Spoke with charge nurse JJ and she advised me to contact [place].  I have contacted Worthy RancherWinnie and she will be down to speak with patient.  Pt has been placed up for discharge.

## 2016-11-11 NOTE — Discharge Instructions (Signed)
Please read and follow all provided instructions.  Your diagnoses today include:  1. Pain, dental     The exam and treatment you received today has been provided on an emergency basis only. This is not a substitute for complete medical or dental care.  Tests performed today include:  Vital signs. See below for your results today.   Medications prescribed:   Penicillin - antibiotic  You have been prescribed an antibiotic medicine: take the entire course of medicine even if you are feeling better. Stopping early can cause the antibiotic not to work.   Naproxen - anti-inflammatory pain medication  Do not exceed 500mg  naproxen every 12 hours, take with food  You have been prescribed an anti-inflammatory medication or NSAID. Take with food. Take smallest effective dose for the shortest duration needed for your pain. Stop taking if you experience stomach pain or vomiting.   Take any prescribed medications only as directed.  Home care instructions:  Follow any educational materials contained in this packet.  Follow-up instructions: Please follow-up with your dentist for further evaluation of your symptoms.   Return instructions:   Please return to the Emergency Department if you experience worsening symptoms.  Please return if you develop a fever, you develop more swelling in your face or neck, you have trouble breathing or swallowing food.  Please return if you have any other emergent concerns.  Additional Information:  Your vital signs today were: BP 122/71 (BP Location: Right Arm)    Pulse (!) 108    Temp 99.3 F (37.4 C) (Oral)    Resp 18    Ht 6' (1.829 m)    Wt 71.7 kg (158 lb)    SpO2 98%    BMI 21.43 kg/m  If your blood pressure (BP) was elevated above 135/85 this visit, please have this repeated by your doctor within one month. --------------

## 2016-11-11 NOTE — ED Provider Notes (Signed)
[facility]-EMERGENCY DEPT Provider Note   CSN: [id number] Arrival date & time: 11/11/16  1701  By signing my name below, I, Teofilo PodMatthew P. Jamison, attest that this documentation has been prepared under the direction and in the presence of Graciella FreerLindsey Layden, [place]-C. Electronically Signed: Teofilo PodMatthew P. Jamison, ED Scribe. 11/11/2016. 8:02 PM.    History   Chief Complaint Chief Complaint  Patient presents with  . Dental Pain    The history is provided by the patient. No language interpreter was used.   HPI Comments:  Vincent Green is a 21 y.o. male who presents to the Emergency Department complaining of constant left sided dental pain x 2 days. Pt complains of associated headache. He has a dentist appointment in 1 month but states that the pain is too un bearable to he came to the ED. He reports difficulty sleeping and eating due to pain. He has taken ibuprofen and Aleve with no relief. [facility] today for same symptoms. At that time, he was told he would not be given any narcotics for pain. He was discharged with a prescription for penicillin and NSAIDs.. Patient states that he came here for a second opinion. Pt denies other associated symptoms.   Past Medical History:  Diagnosis Date  . Anxiety   . Anxiety   . Chronic dental pain   . PTSD (post-traumatic stress disorder)   . PTSD (post-traumatic stress disorder)   . Scoliosis   . Seizures (HCC)    benzo-seizure     There are no active problems to display for this patient.   Past Surgical History:  Procedure Laterality Date  . CHOANAL ATRESIA REPAIR    . NOSE SURGERY    . SKIN GRAFT         Home Medications    Prior to Admission medications   Medication Sig Start Date End Date Taking? Authorizing Provider  acetaminophen (TYLENOL) 500 MG tablet Take 500-1,000 mg by mouth every 6 (six) hours as needed for moderate pain.     [provider]  albuterol (PROVENTIL HFA;VENTOLIN HFA) 108 (90 Base)  MCG/ACT inhaler Inhale 1-2 puffs into the lungs every 6 (six) hours as needed for wheezing or shortness of breath. 04/28/16   Burgess AmorIdol, Julie, PA-C  ketorolac (TORADOL) 10 MG tablet Take 1 tablet (10 mg total) by mouth every 6 (six) hours as needed. 11/11/16   Maxwell CaulLayden, Lindsey A, PA-C  naproxen (NAPROSYN) 500 MG tablet Take 1 tablet (500 mg total) by mouth 2 (two) times daily. 11/11/16   Renne CriglerGeiple, Joshua, PA-C  penicillin v potassium (VEETID) 500 MG tablet Take 1 tablet (500 mg total) by mouth 3 (three) times daily. 11/11/16 11/18/16  Maxwell CaulLayden, Lindsey A, PA-C  promethazine (PHENERGAN) 25 MG tablet Take 1 tablet (25 mg total) by mouth every 6 (six) hours as needed for nausea or vomiting. 05/01/16   Burgess AmorIdol, Julie, PA-C  promethazine-codeine (PHENERGAN WITH CODEINE) 6.25-10 MG/5ML syrup Take 5 mLs by mouth every 4 (four) hours as needed for cough. 04/28/16   Burgess AmorIdol, Julie, PA-C    Family History No family history on file.  Social History Social History  Substance Use Topics  . Smoking status: Current Every Day Smoker    Packs/day: 0.50    Types: Cigarettes  . Smokeless tobacco: Never Used  . Alcohol use No     Allergies   Tessalon [benzonatate]   Review of Systems Review of Systems  Constitutional: Negative for fever.  HENT: Positive for dental  problem.   Respiratory: Negative for shortness of breath.   Neurological: Positive for headaches.  All other systems reviewed and are negative.    Physical Exam Updated Vital Signs BP 102/71 (BP Location: Right Arm)   Pulse 93   Temp 98.5 F (36.9 C) (Oral)   Resp 16   SpO2 100%   Physical Exam  Constitutional: He appears well-developed and well-nourished. No distress.  Sitting comfortably on examination table  HENT:  Head: Normocephalic and atraumatic.  Poor overall dentition. Multiple missing teeth. Multiple evidence of diffuse decay and erosion. Right molar is cracked. No gingival swelling. No trismus.   Eyes: Conjunctivae are normal.    Cardiovascular: Normal rate.   Pulmonary/Chest: Effort normal.  No evidence of respiratory distress. Able to speak in full sentences without difficulty.  Abdominal: He exhibits no distension.  Neurological: He is alert.  Skin: Skin is warm and dry.  Psychiatric: He has a normal mood and affect.  Nursing note and vitals reviewed.    ED Treatments / Results  DIAGNOSTIC STUDIES:  Oxygen Saturation is 99% on RA, normal by my interpretation.    COORDINATION OF CARE:  7:54 PM Discussed treatment plan with pt at bedside and pt agreed to plan.   Labs (all labs ordered are listed, but only abnormal results are displayed) Labs Reviewed - No data to display  EKG  EKG Interpretation None       Radiology No results found.  Procedures Procedures (including critical care time)  Medications Ordered in ED Medications  ketorolac (TORADOL) injection 60 mg (60 mg Intramuscular Given 11/11/16 2018)     Initial Impression / Assessment and Plan / ED Course  I have reviewed the triage vital signs and the nursing notes.  Pertinent labs & imaging results that were available during my care of the patient were reviewed by me and considered in my medical decision making (see chart for details).     21 y.o. M who presents with 2 days of dental pain. Seen at [facility] earlier today. No evidence of abscess requiring immediate incision and drainage. Exam not concerning for Ludwig's angina or pharyngeal abscess. Patient is requesting pain medication department for treatment of his pain. Explained to patient that we do not treat dental pain with narcotics. I instructed him I would be happy to prescribe him some NSAIDs for pain relief but he states that that is "not enough that he needs something stronger." We discussed at length the options for treating acute dental pain. Discussed with him that I reviewed his previous visit from [facility] and agreed with the plan of action. He reports that he left  without the prescription. Told him I would be happy to refill the prescription for penicillin and NSAIDs. Requesting a dose of narcotics in the department. Explained that I would not provide him any narcotics for dental pain. Patient states he wants to leave and would like to file a grievance.   Patient is asking for Toradol. Discussed with Dr. Lynelle Doctor. Will plan to give one-time dose of Toradol in the department. Patient requests talk to supervising doctor.  Will plan to give prescription for Toradol for pain relief. Patient instructed to follow-up with dentist referral provided. Stable for discharge at this time. Strict return precautions discussed. Patient expresses understanding and agreement to plan.   Final Clinical Impressions(s) / ED Diagnoses   Final diagnoses:  Pain, dental    New Prescriptions Discharge Medication List as of 11/11/2016  8:28 PM  START taking these medications   Details  ketorolac (TORADOL) 10 MG tablet Take 1 tablet (10 mg total) by mouth every 6 (six) hours as needed., Starting Sat 11/11/2016, Print       I personally performed the services described in this documentation, which was scribed in my presence. The recorded information has been reviewed and is accurate.    Maxwell Caul, PA-C 11/12/16 1610    Linwood Dibbles, MD 11/12/16 [phone]

## 2016-11-11 NOTE — ED Triage Notes (Signed)
PT c/o Right lower dental pain recurrent x6 days and states he has dentist appt for end of June.

## 2016-11-11 NOTE — Discharge Instructions (Signed)
Follow-up with the referred dental offices. Call and see if you can arrange an earlier appointment.   Take antibiotics as prescribed.  Take medications as directed for pain.   You can apply warm compresses to the area for relief of pain.  Return to the Emergency Dept for any fever, worsening pain, swelling of the gums, difficulty breathing, or any other concerns.

## 2016-11-11 NOTE — ED Notes (Signed)
Worthy RancherWinnie spoke with Dr. Adriana Simasook and is at bedside with patient.  Pt states that he does not want his prescriptions and that "I have something else going on, antibiotics aren't going to help".  Pt walked out the door without being discharged.

## 2016-11-11 NOTE — ED Notes (Signed)
Vincent Green is at bedside

## 2016-11-11 NOTE — ED Provider Notes (Signed)
[facility] DEPT Provider Note   CSN: [id number] Arrival date & time: 11/11/16  1458     History   Chief Complaint Chief Complaint  Patient presents with  . Dental Pain    HPI Vincent Green is a 21 y.o. male.  Patient with history of dental pain presents with complaint of lower dental pain for the past 1 week. He states that the pain is severe and he cannot sleep. He states that he has had swelling in his cheek. No fevers, difficulty breathing, difficulty swallowing. He has been using anti-inflammatories without relief. The onset of this condition was acute. The course is constant. Aggravating factors: none. Alleviating factors: none.        Past Medical History:  Diagnosis Date  . Anxiety   . Anxiety   . Chronic dental pain   . PTSD (post-traumatic stress disorder)   . PTSD (post-traumatic stress disorder)   . Scoliosis   . Seizures (HCC)    benzo-seizure     There are no active problems to display for this patient.   Past Surgical History:  Procedure Laterality Date  . CHOANAL ATRESIA REPAIR    . NOSE SURGERY    . SKIN GRAFT         Home Medications    Prior to Admission medications   Medication Sig Start Date End Date Taking? Authorizing Provider  acetaminophen (TYLENOL) 500 MG tablet Take 500-1,000 mg by mouth every 6 (six) hours as needed for moderate pain.     [provider]  albuterol (PROVENTIL HFA;VENTOLIN HFA) 108 (90 Base) MCG/ACT inhaler Inhale 1-2 puffs into the lungs every 6 (six) hours as needed for wheezing or shortness of breath. 04/28/16   Burgess AmorIdol, Julie, PA-C  naproxen (NAPROSYN) 500 MG tablet Take 1 tablet (500 mg total) by mouth 2 (two) times daily. 11/11/16   Renne CriglerGeiple, Sheenah Dimitroff, PA-C  penicillin v potassium (VEETID) 500 MG tablet Take 1 tablet (500 mg total) by mouth 3 (three) times daily. 11/11/16   Renne CriglerGeiple, Joaopedro Eschbach, PA-C  promethazine (PHENERGAN) 25 MG tablet Take 1 tablet (25 mg total) by mouth every 6 (six) hours as needed for  nausea or vomiting. 05/01/16   Burgess AmorIdol, Julie, PA-C  promethazine-codeine (PHENERGAN WITH CODEINE) 6.25-10 MG/5ML syrup Take 5 mLs by mouth every 4 (four) hours as needed for cough. 04/28/16   Burgess AmorIdol, Julie, PA-C    Family History History reviewed. No pertinent family history.  Social History Social History  Substance Use Topics  . Smoking status: Current Every Day Smoker    Packs/day: 0.50    Types: Cigarettes  . Smokeless tobacco: Never Used  . Alcohol use No     Allergies   Tessalon [benzonatate]   Review of Systems Review of Systems  Constitutional: Negative for fever.  HENT: Positive for dental problem and facial swelling. Negative for ear pain, sore throat and trouble swallowing.   Respiratory: Negative for shortness of breath and stridor.   Musculoskeletal: Negative for neck pain.  Skin: Negative for color change.  Neurological: Negative for headaches.     Physical Exam Updated Vital Signs BP 122/71 (BP Location: Right Arm)   Pulse (!) 108   Temp 99.3 F (37.4 C) (Oral)   Resp 18   Ht 6' (1.829 m)   Wt 71.7 kg (158 lb)   SpO2 98%   BMI 21.43 kg/m   Physical Exam  Constitutional: He appears well-developed and well-nourished.  HENT:  Head: Normocephalic and atraumatic.  Right Ear: Tympanic membrane, external  ear and ear canal normal.  Left Ear: Tympanic membrane, external ear and ear canal normal.  Nose: Nose normal.  Mouth/Throat: Uvula is midline, oropharynx is clear and moist and mucous membranes are normal. No trismus in the jaw. Abnormal dentition. Dental caries present. No dental abscesses or uvula swelling. No tonsillar abscesses.  Patient with R mandibular tooth pain and tenderness to palpation in area of R incisors. No swelling or erythema noted on exam. Upper teeth are missing. Lower molars are missing.    Eyes: Pupils are equal, round, and reactive to light.  Neck: Normal range of motion. Neck supple.  No neck swelling or Lugwig's angina    Neurological: He is alert.  Skin: Skin is warm and dry.  Psychiatric: He has a normal mood and affect.  Nursing note and vitals reviewed.    ED Treatments / Results   Procedures Procedures (including critical care time)  Medications Ordered in ED Medications - No data to display   Initial Impression / Assessment and Plan / ED Course  I have reviewed the triage vital signs and the nursing notes.  Pertinent labs & imaging results that were available during my care of the patient were reviewed by me and considered in my medical decision making (see chart for details).     3:41 PM Patient seen and examined.   Vital signs reviewed and are as follows: BP 122/71 (BP Location: Right Arm)   Pulse (!) 108   Temp 99.3 F (37.4 C) (Oral)   Resp 18   Ht 6' (1.829 m)   Wt 71.7 kg (158 lb)   SpO2 98%   BMI 21.43 kg/m   I discussed appropriate care and plan for uncomplicated dental pain with patient which includes penicillin and anti-inflammatories.  Patient stated that this will not suffice for his pain. He states that he needs something stronger than ibuprofen for pain. He bargained, requesting Tylenol #3. I declined.   Patient requested to speak with physician. I discussed exam and findings as well as plan with Dr. Adriana Simas personally, who agrees that this plan is appropriate.   I returned to patient room to update him and informed that we would be discharging him with the previous discussed plan. I did offer a dental block, which he declines.   Pt requests to file a grievance. RN Annice Pih is calling for patient to speak with someone regarding this.   Encouraged patient to follow-up as planned.    Final Clinical Impressions(s) / ED Diagnoses   Final diagnoses:  Pain, dental   Patient with toothache. No fever. Exam unconcerning for Ludwig's angina or other deep tissue infection in neck.   Patient with chronic dental findings, no acute abscess or infection. Treatment with  penicillin to cover early infection, NSAIDs for pain.  New Prescriptions New Prescriptions   NAPROXEN (NAPROSYN) 500 MG TABLET    Take 1 tablet (500 mg total) by mouth 2 (two) times daily.   PENICILLIN V POTASSIUM (VEETID) 500 MG TABLET    Take 1 tablet (500 mg total) by mouth 3 (three) times daily.     Renne Crigler, PA-C 11/11/16 1547    [name], MD 11/12/16 [phone]

## 2016-11-22 ENCOUNTER — Emergency Department (HOSPITAL_COMMUNITY)
Admission: EM | Admit: 2016-11-22 | Discharge: 2016-11-22 | Disposition: A | Discharge status: Home / Self Care | Payer: Self-pay | Attending: Emergency Medicine | Admitting: Emergency Medicine

## 2016-11-22 ENCOUNTER — Encounter (HOSPITAL_COMMUNITY): Payer: Self-pay | Admitting: *Deleted

## 2016-11-22 DIAGNOSIS — R0789 Other chest pain: Secondary | ICD-10-CM | POA: Insufficient documentation

## 2016-11-22 DIAGNOSIS — R05 Cough: Secondary | ICD-10-CM | POA: Insufficient documentation

## 2016-11-22 DIAGNOSIS — R0981 Nasal congestion: Secondary | ICD-10-CM | POA: Insufficient documentation

## 2016-11-22 DIAGNOSIS — Z79899 Other long term (current) drug therapy: Secondary | ICD-10-CM | POA: Insufficient documentation

## 2016-11-22 DIAGNOSIS — K029 Dental caries, unspecified: Secondary | ICD-10-CM | POA: Insufficient documentation

## 2016-11-22 DIAGNOSIS — R1110 Vomiting, unspecified: Secondary | ICD-10-CM | POA: Insufficient documentation

## 2016-11-22 DIAGNOSIS — R059 Cough, unspecified: Secondary | ICD-10-CM

## 2016-11-22 DIAGNOSIS — F17210 Nicotine dependence, cigarettes, uncomplicated: Secondary | ICD-10-CM | POA: Insufficient documentation

## 2016-11-22 DIAGNOSIS — J3489 Other specified disorders of nose and nasal sinuses: Secondary | ICD-10-CM | POA: Insufficient documentation

## 2016-11-22 MED ORDER — GUAIFENESIN-DM 100-10 MG/5ML PO SYRP
10.0000 mL | ORAL_SOLUTION | Freq: Once | ORAL | Status: DC
  Filled 2016-11-22: qty 10

## 2016-11-22 MED ORDER — ALBUTEROL SULFATE HFA 108 (90 BASE) MCG/ACT IN AERS
2.0000 | INHALATION_SPRAY | Freq: Once | RESPIRATORY_TRACT | Status: AC
  Administered 2016-11-22: 2 via RESPIRATORY_TRACT
  Filled 2016-11-22: qty 6.7

## 2016-11-22 MED ORDER — PROMETHAZINE-DM 6.25-15 MG/5ML PO SYRP: 5.0000 mL | ORAL_SOLUTION | Freq: Four times a day (QID) | ORAL | 0 refills | Status: DC | PRN

## 2016-11-22 MED ORDER — ONDANSETRON 8 MG PO TBDP
8.0000 mg | ORAL_TABLET | Freq: Once | ORAL | Status: AC
  Administered 2016-11-22: 8 mg via ORAL
  Filled 2016-11-22: qty 1

## 2016-11-22 NOTE — Discharge Instructions (Signed)
2 puffs of the inhaler 4 times a day as needed.  Follow-up with your doctor or the clinic listed if the cough is not improving.

## 2016-11-22 NOTE — ED Triage Notes (Signed)
Pt comes in for a productive cough staring 3-4 days ago. States sputum is yellow and brown. Has chest pain when coughing. Upon triage pt began coughing and had one episode of emesis.

## 2016-11-22 NOTE — ED Notes (Signed)
Security notified of patient arrival in ED.

## 2016-11-22 NOTE — ED Provider Notes (Signed)
[facility] Provider Note   CSN: [id number] Arrival date & time: 11/22/16  0954     History   Chief Complaint Chief Complaint  Patient presents with  . Cough    HPI Vincent Green is a 21 y.o. male.  HPI   Vincent Green is a 21 y.o. male who presents to the Emergency Department complaining of Productive cough for 4 days. He states the cough has been productive of brown to yellow sputum associated with burning and central chest pain with excessive coughing. He also reports some nasal congestion. Today, he states the cough has been excessive and he has had several episodes of posttussive emesis. He denies known fever, abdominal pain, diarrhea, dysuria, shortness of breath. Is not taking any over-the-counter medications for symptomatic relief. He is tolerating foods and fluids.  Past Medical History:  Diagnosis Date  . Anxiety   . Anxiety   . Chronic dental pain   . PTSD (post-traumatic stress disorder)   . PTSD (post-traumatic stress disorder)   . Scoliosis   . Seizures (HCC)    benzo-seizure     There are no active problems to display for this patient.   Past Surgical History:  Procedure Laterality Date  . CHOANAL ATRESIA REPAIR    . NOSE SURGERY    . SKIN GRAFT         Home Medications    Prior to Admission medications   Medication Sig Start Date End Date Taking? Authorizing Provider  acetaminophen (TYLENOL) 500 MG tablet Take 500-1,000 mg by mouth every 6 (six) hours as needed for moderate pain.    Yes [provider]  albuterol (PROVENTIL HFA;VENTOLIN HFA) 108 (90 Base) MCG/ACT inhaler Inhale 1-2 puffs into the lungs every 6 (six) hours as needed for wheezing or shortness of breath. 04/28/16  Yes Idol, Raynelle Fanning, PA-C  traZODone (DESYREL) 100 MG tablet Take 100 mg by mouth at bedtime.   Yes [provider]  ketorolac (TORADOL) 10 MG tablet Take 1 tablet (10 mg total) by mouth every 6 (six) hours as needed. Patient not taking: Reported  on 11/22/2016 11/11/16   Graciella Freer A, PA-C  naproxen (NAPROSYN) 500 MG tablet Take 1 tablet (500 mg total) by mouth 2 (two) times daily. Patient not taking: Reported on 11/22/2016 11/11/16   Renne Crigler, PA-C    Family History No family history on file.  Social History Social History  Substance Use Topics  . Smoking status: Current Every Day Smoker    Packs/day: 0.50    Types: Cigarettes  . Smokeless tobacco: Never Used  . Alcohol use No     Allergies   Tessalon [benzonatate]   Review of Systems Review of Systems  Constitutional: Negative for appetite change, chills and fever.  HENT: Positive for congestion and rhinorrhea. Negative for sore throat and trouble swallowing.   Respiratory: Positive for cough and chest tightness. Negative for shortness of breath and wheezing.   Cardiovascular: Negative for chest pain.  Gastrointestinal: Positive for vomiting. Negative for abdominal pain and nausea.  Genitourinary: Negative for dysuria, flank pain and frequency.  Musculoskeletal: Negative for arthralgias.  Skin: Negative for rash.  Neurological: Negative for dizziness, weakness and numbness.  Hematological: Negative for adenopathy.  All other systems reviewed and are negative.    Physical Exam Updated Vital Signs BP 110/86 (BP Location: Right Arm)   Pulse 86   Temp 98.1 F (36.7 C) (Oral)   Resp 18   Ht 6' (1.829 m)   Wt 71.7  kg (158 lb)   SpO2 100%   BMI 21.43 kg/m   Physical Exam  Constitutional: He is oriented to person, place, and time. He appears well-developed and well-nourished. No distress.  HENT:  Head: Normocephalic and atraumatic.  Right Ear: Tympanic membrane and ear canal normal.  Left Ear: Tympanic membrane and ear canal normal.  Mouth/Throat: Uvula is midline, oropharynx is clear and moist and mucous membranes are normal. Dental caries present. No oropharyngeal exudate.  Widespread dental decay and poor dentition  Eyes: EOM are normal. Pupils  are equal, round, and reactive to light.  Neck: Normal range of motion, full passive range of motion without pain and phonation normal. Neck supple.  Cardiovascular: Normal rate, regular rhythm, normal heart sounds and intact distal pulses.   No murmur heard. Pulmonary/Chest: Effort normal and breath sounds normal. No stridor. No respiratory distress. He has no wheezes. He has no rales. He exhibits no tenderness.  Pt actively coughing, no distress, rales or wheezing  Abdominal: Soft. Bowel sounds are normal. He exhibits no distension. There is no tenderness.  Musculoskeletal: Normal range of motion. He exhibits no edema.  Lymphadenopathy:    He has no cervical adenopathy.  Neurological: He is alert and oriented to person, place, and time. He exhibits normal muscle tone. Coordination normal.  Skin: Skin is warm and dry. Capillary refill takes less than 2 seconds. No rash noted.  Nursing note and vitals reviewed.    ED Treatments / Results  Labs (all labs ordered are listed, but only abnormal results are displayed) Labs Reviewed - No data to display  EKG  EKG Interpretation None       Radiology No results found.  Procedures Procedures (including critical care time)  Medications Ordered in ED Medications  ondansetron (ZOFRAN-ODT) disintegrating tablet 8 mg (not administered)  albuterol (PROVENTIL HFA;VENTOLIN HFA) 108 (90 Base) MCG/ACT inhaler 2 puff (not administered)  guaiFENesin-dextromethorphan (ROBITUSSIN DM) 100-10 MG/5ML syrup 10 mL (not administered)     Initial Impression / Assessment and Plan / ED Course  I have reviewed the triage vital signs and the nursing notes.  Pertinent labs & imaging results that were available during my care of the patient were reviewed by me and considered in my medical decision making (see chart for details).     Patient is well-appearing. Vital signs are stable. He has had several episodes of posttussive emesis upon ER arrival, but  none since. Tolerating po fluids.  Pt refused anti-tussive here, stating that only cough medication that works is Engineer, technical salesHycodan.  I do not feel that narcotic cough medication is indicated,  Mucous membranes are moist. No clinical signs of dehydration. Lungs are clear to auscultation bilaterally.  Pt requesting to leave, he is stable for d/c.    Final Clinical Impressions(s) / ED Diagnoses   Final diagnoses:  Cough    New Prescriptions New Prescriptions   No medications on file     Pauline Ausriplett, Ryelan Kazee, Cordelia Poche-C 11/23/16 [phone]    Bethann BerkshireZammit, Joseph, MD 11/28/16 1622

## 2017-01-25 ENCOUNTER — Emergency Department (HOSPITAL_COMMUNITY)
Admission: EM | Admit: 2017-01-25 | Discharge: 2017-01-26 | Disposition: A | Discharge status: Home / Self Care | Payer: Self-pay | Attending: Emergency Medicine | Admitting: Emergency Medicine

## 2017-01-25 ENCOUNTER — Encounter (HOSPITAL_COMMUNITY): Payer: Self-pay | Admitting: Emergency Medicine

## 2017-01-25 DIAGNOSIS — R1110 Vomiting, unspecified: Secondary | ICD-10-CM | POA: Insufficient documentation

## 2017-01-25 DIAGNOSIS — Z5321 Procedure and treatment not carried out due to patient leaving prior to being seen by health care provider: Secondary | ICD-10-CM | POA: Insufficient documentation

## 2017-01-25 LAB — COMPREHENSIVE METABOLIC PANEL
ALK PHOS: 58 U/L (ref 38–126)
ALT: 12 U/L — AB (ref 17–63)
AST: 18 U/L (ref 15–41)
Albumin: 4.3 g/dL (ref 3.5–5.0)
Anion gap: 12 (ref 5–15)
BUN: 8 mg/dL (ref 6–20)
CALCIUM: 8.8 mg/dL — AB (ref 8.9–10.3)
CO2: 22 mmol/L (ref 22–32)
CREATININE: 1.33 mg/dL — AB (ref 0.61–1.24)
Chloride: 104 mmol/L (ref 101–111)
Glucose, Bld: 85 mg/dL (ref 65–99)
Potassium: 3.7 mmol/L (ref 3.5–5.1)
SODIUM: 138 mmol/L (ref 135–145)
Total Bilirubin: 0.8 mg/dL (ref 0.3–1.2)
Total Protein: 6.6 g/dL (ref 6.5–8.1)

## 2017-01-25 LAB — RAPID URINE DRUG SCREEN, HOSP PERFORMED
AMPHETAMINES: POSITIVE — AB
BARBITURATES: NOT DETECTED
BENZODIAZEPINES: POSITIVE — AB
COCAINE: NOT DETECTED
Opiates: NOT DETECTED
TETRAHYDROCANNABINOL: NOT DETECTED

## 2017-01-25 LAB — CBC
HCT: 38.6 % — ABNORMAL LOW (ref 39.0–52.0)
Hemoglobin: 13.3 g/dL (ref 13.0–17.0)
MCH: 30.3 pg (ref 26.0–34.0)
MCHC: 34.5 g/dL (ref 30.0–36.0)
MCV: 87.9 fL (ref 78.0–100.0)
Platelets: 233 10*3/uL (ref 150–400)
RBC: 4.39 MIL/uL (ref 4.22–5.81)
RDW: 13.6 % (ref 11.5–15.5)
WBC: 12.4 10*3/uL — AB (ref 4.0–10.5)

## 2017-01-25 LAB — ETHANOL: Alcohol, Ethyl (B): <5 mg/dL (ref <5)

## 2017-01-25 NOTE — ED Triage Notes (Signed)
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 Pt reports emesis earlier today. Pt arrives to ED with [facility] who [facility] but they sent him here for initial work up.

## 2017-01-25 NOTE — ED Notes (Signed)
Called patient to reassess vital signs 2x with no answer.

## 2017-01-26 ENCOUNTER — Emergency Department (HOSPITAL_COMMUNITY): Admission: EM | Admit: 2017-01-26 | Discharge: 2017-01-26 | Discharge status: Against Medical Advice | Payer: Self-pay

## 2017-01-27 ENCOUNTER — Encounter (HOSPITAL_COMMUNITY): Payer: Self-pay | Admitting: Emergency Medicine

## 2017-01-27 ENCOUNTER — Ambulatory Visit (HOSPITAL_COMMUNITY)
Admission: RE | Admit: 2017-01-27 | Discharge: 2017-01-27 | Disposition: A | Discharge status: Home / Self Care | Payer: Self-pay | Attending: Psychiatry | Admitting: Psychiatry

## 2017-01-27 DIAGNOSIS — F1910 Other psychoactive substance abuse, uncomplicated: Secondary | ICD-10-CM | POA: Insufficient documentation

## 2017-01-27 NOTE — H&P (Signed)
Behavioral Health Medical Screening Exam  Vincent Green is an 21 y.o. male [facility] father with request for detox. Patient denies any SI/HI/VAH.  Total Time spent with patient: 30 minutes  Psychiatric Specialty Exam: Physical Exam  Constitutional: He is oriented to person, place, and time. He appears well-developed and well-nourished.  HENT:  Head: Normocephalic.  Eyes: Pupils are equal, round, and reactive to light.  Neck: Normal range of motion. Neck supple.  Cardiovascular: Normal rate, regular rhythm and normal heart sounds.   Respiratory: Effort normal and breath sounds normal.  GI: Soft. Bowel sounds are normal.  Musculoskeletal: Normal range of motion.  Neurological: He is alert and oriented to person, place, and time.  Skin: Skin is warm and dry.    Review of Systems  Psychiatric/Behavioral: Positive for substance abuse. Negative for depression, hallucinations, memory loss and suicidal ideas. The patient is not nervous/anxious and does not have insomnia.   All other systems reviewed and are negative.   Blood pressure 106/68, pulse (!) 112, temperature 98.5 F (36.9 C), temperature source Oral, resp. rate 16, SpO2 100 %.There is no height or weight on file to calculate BMI.  General Appearance: Casual and unremarkable  Eye Contact:  Good  Speech:  Clear and Coherent and Normal Rate  Volume:  Normal  Mood:  Anxious  Affect:  Congruent  Thought Process:  Coherent and Goal Directed  Orientation:  Full (Time, Place, and Person)  Thought Content:  WDL and Logical  Suicidal Thoughts:  No  Homicidal Thoughts:  No  Memory:  Immediate;   Good Recent;   Good Remote;   Fair  Judgement:  Good  Insight:  Good  Psychomotor Activity:  Normal  Concentration: Concentration: Good and Attention Span: Good  Recall:  Good  Fund of Knowledge:Good  Language: Good  Akathisia:  Negative  Handed:  Right  AIMS (if indicated):     Assets:  Communication  Skills Desire for Improvement Financial Resources/Insurance Housing Leisure Time Physical Health Resilience Social Support  Sleep:       Musculoskeletal: Strength & Muscle Tone: within normal limits Gait & Station: normal Patient leans: N/A  Blood pressure 106/68, pulse (!) 112, temperature 98.5 F (36.9 C), temperature source Oral, resp. rate 16, SpO2 100 %.  Recommendations:  Based on my evaluation the patient does not appear to have an emergency medical condition. OP detox facilities provided, patient agrees to follow up   Delila PereyraJustina A Okonkwo, NP 01/27/2017, 3:16 PM
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
## 2017-02-10 ENCOUNTER — Ambulatory Visit (HOSPITAL_COMMUNITY)
Admission: EM | Admit: 2017-02-10 | Discharge: 2017-02-10 | Disposition: A | Discharge status: Home / Self Care | Payer: Medicaid Other | Attending: Family Medicine | Admitting: Family Medicine

## 2017-02-10 ENCOUNTER — Emergency Department (HOSPITAL_COMMUNITY)
Admission: EM | Admit: 2017-02-10 | Discharge: 2017-02-10 | Disposition: A | Discharge status: Home / Self Care | Payer: Self-pay | Attending: Emergency Medicine | Admitting: Emergency Medicine

## 2017-02-10 ENCOUNTER — Encounter (HOSPITAL_COMMUNITY): Payer: Self-pay | Admitting: Emergency Medicine

## 2017-02-10 ENCOUNTER — Encounter (HOSPITAL_COMMUNITY): Payer: Self-pay

## 2017-02-10 DIAGNOSIS — R059 Cough, unspecified: Secondary | ICD-10-CM

## 2017-02-10 DIAGNOSIS — J069 Acute upper respiratory infection, unspecified: Secondary | ICD-10-CM | POA: Insufficient documentation

## 2017-02-10 DIAGNOSIS — B349 Viral infection, unspecified: Secondary | ICD-10-CM | POA: Insufficient documentation

## 2017-02-10 DIAGNOSIS — F419 Anxiety disorder, unspecified: Secondary | ICD-10-CM | POA: Insufficient documentation

## 2017-02-10 DIAGNOSIS — R05 Cough: Secondary | ICD-10-CM

## 2017-02-10 DIAGNOSIS — B9789 Other viral agents as the cause of diseases classified elsewhere: Secondary | ICD-10-CM

## 2017-02-10 DIAGNOSIS — F17210 Nicotine dependence, cigarettes, uncomplicated: Secondary | ICD-10-CM | POA: Insufficient documentation

## 2017-02-10 DIAGNOSIS — J209 Acute bronchitis, unspecified: Secondary | ICD-10-CM

## 2017-02-10 MED ORDER — ALBUTEROL SULFATE HFA 108 (90 BASE) MCG/ACT IN AERS: 1.0000 | INHALATION_SPRAY | Freq: Four times a day (QID) | RESPIRATORY_TRACT | 0 refills | Status: DC | PRN

## 2017-02-10 MED ORDER — METHYLPREDNISOLONE 4 MG PO TBPK: ORAL_TABLET | ORAL | 0 refills | Status: DC

## 2017-02-10 MED ORDER — AZITHROMYCIN 250 MG PO TABS: 250.0000 mg | ORAL_TABLET | Freq: Every day | ORAL | 0 refills | Status: DC

## 2017-02-10 MED ORDER — GUAIFENESIN-CODEINE 100-10 MG/5ML PO SYRP: 5.0000 mL | ORAL_SOLUTION | Freq: Three times a day (TID) | ORAL | 0 refills | Status: DC | PRN

## 2017-02-10 MED ORDER — FLUTICASONE PROPIONATE 50 MCG/ACT NA SUSP: 1.0000 | Freq: Every day | NASAL | 0 refills | Status: DC

## 2017-02-10 MED ORDER — PHENOL 1.4 % MT LIQD: 1.0000 | OROMUCOSAL | 0 refills | Status: DC | PRN

## 2017-02-10 NOTE — ED Triage Notes (Signed)
Onset 1 week cough, cough caused vomiting x 1 this morning, and sore throat.  Dad and stepmom with same s/s.  No resp or swallowing difficulties.

## 2017-02-10 NOTE — ED Triage Notes (Signed)
Pt here for prod cough onset 5 days associated w/vomiting, CP due to cough   Denies fevers, chills  Pt smokes 5 cigs per day.   A&O x4... NAD... Ambulatory

## 2017-02-10 NOTE — ED Notes (Signed)
Declined W/C at D/C and was escorted to lobby by RN. 

## 2017-02-10 NOTE — Discharge Instructions (Signed)
Continue to use the guanfacine as needed for cough. Use fentanyl spray as needed for sore throat. Use Flonase daily to help with nasal congestion. It is important that you stay well-hydrated. Use Tylenol or ibuprofen as needed for sore throat or fever. Follow-up with [facility] and wellness in 5 days if symptoms are not improving. Return to the emergency room if you develop persistent high fevers despite medication, chest pain, difficulty breathing, or any new or worsening symptoms.

## 2017-02-10 NOTE — ED Provider Notes (Signed)
  [facility]   [id number] 02/10/17 Arrival Time: 1923  ASSESSMENT & PLAN:  1. Acute bronchitis, unspecified organism   2. Cough     Meds ordered this encounter  Medications  . azithromycin (ZITHROMAX) 250 MG tablet    Sig: Take 1 tablet (250 mg total) by mouth daily. Take first 2 tablets together, then 1 every day until finished.    Dispense:  6 tablet    Refill:  0    Order Specific Question:   Supervising Provider    Answer:   Mardella Layman [id number]  . methylPREDNISolone (MEDROL DOSEPAK) 4 MG TBPK tablet    Sig: Take 6-5-4-3-2-1 po qd    Dispense:  21 tablet    Refill:  0    Order Specific Question:   Supervising Provider    AnswerMardella Layman [[id number]]  . albuterol (PROVENTIL HFA;VENTOLIN HFA) 108 (90 Base) MCG/ACT inhaler    Sig: Inhale 1-2 puffs into the lungs every 6 (six) hours as needed for wheezing or shortness of breath.    Dispense:  1 Inhaler    Refill:  0    Order Specific Question:   Supervising Provider    Answer:   Mardella Layman [id number]  . guaiFENesin-codeine (CHERATUSSIN AC) 100-10 MG/5ML syrup    Sig: Take 5 mLs by mouth 3 (three) times daily as needed for cough.    Dispense:  120 mL    Refill:  0    Order Specific Question:   Supervising Provider    Answer:   Mardella Layman [[id number]]    Reviewed expectations re: course of current medical issues. Questions answered. Outlined signs and symptoms indicating need for more acute intervention. Patient verbalized understanding. After Visit Summary given.   SUBJECTIVE:  Vincent Green is a 21 y.o. male who presents with complaint of cough and wheezing and uri sx's for a week.  ROS: As per HPI.   OBJECTIVE:  Vitals:   02/10/17 1946  BP: 116/73  Pulse: (!) 121  Resp: 20  Temp: 99 F (37.2 C)  TempSrc: Oral  SpO2: 100%     General appearance: alert; no distress Eyes: PERRLA; EOMI; conjunctiva normal HENT: normocephalic; atraumatic; TMs normal; nasal mucosa normal; oral mucosa  normal Neck: supple Lungs: clear to auscultation bilaterally Heart: regular rate and rhythm Abdomen: soft, non-tender; bowel sounds normal; no masses or organomegaly; no guarding or rebound tenderness Back: no CVA tenderness Extremities: no cyanosis or edema; symmetrical with no gross deformities Skin: warm and dry Neurologic: normal gait; normal symmetric reflexes Psychological: alert and cooperative; normal mood and affect  Past Medical History:  Diagnosis Date  . Anxiety   . Anxiety   . Chronic dental pain   . PTSD (post-traumatic stress disorder)   . PTSD (post-traumatic stress disorder)   . Scoliosis   . Seizures (HCC)    benzo-seizure      has a past medical history of Anxiety; Anxiety; Chronic dental pain; PTSD (post-traumatic stress disorder); PTSD (post-traumatic stress disorder); Scoliosis; and Seizures (HCC).    Labs Reviewed - No data to display  Imaging: No results found.  Allergies  Allergen Reactions  . Tessalon [Benzonatate] Swelling    Throat swelling    History reviewed. No pertinent family history. Past Surgical History:  Procedure Laterality Date  . CHOANAL ATRESIA REPAIR    . NOSE SURGERY    . SKIN GRAFT           Deatra Canter, [place] 02/10/17 2016

## 2017-02-10 NOTE — ED Provider Notes (Signed)
[facility]-EMERGENCY DEPT Provider Note   CSN: [id number] Arrival date & time: 02/10/17  1356     History   Chief Complaint Chief Complaint  Patient presents with  . Cough    HPI Lonzo Saulter is a 21 y.o. male is entered with one week cough, congestion, sore throat.  He states that he started to have symptoms approximately 1 week ago. Around the same time, his aunt and dad started to have similar symptoms. Cough is nonproductive, and makes his throat worse. Today he reports he coughs so hard that he spit up some yellow stuff. He reports some associated bilateral ear discomfort and frontal sinus pressure. He has been taking guanfacine and Tussionex with relief, but his aunt will not give him any more Tussionex. He denies fevers, chills, vision changes, chest pain, shortness of breath, abdominal pain, or vomiting. He denies difficulty handling his secretions, voice change, or appetite change. He states he is not immunocompromised.  HPI  Past Medical History:  Diagnosis Date  . Anxiety   . Anxiety   . Chronic dental pain   . PTSD (post-traumatic stress disorder)   . PTSD (post-traumatic stress disorder)   . Scoliosis   . Seizures (HCC)    benzo-seizure     There are no active problems to display for this patient.   Past Surgical History:  Procedure Laterality Date  . CHOANAL ATRESIA REPAIR    . NOSE SURGERY    . SKIN GRAFT         Home Medications    Prior to Admission medications   Medication Sig Start Date End Date Taking? Authorizing Provider  acetaminophen (TYLENOL) 500 MG tablet Take 500-1,000 mg by mouth every 6 (six) hours as needed for moderate pain.     [provider]  albuterol (PROVENTIL HFA;VENTOLIN HFA) 108 (90 Base) MCG/ACT inhaler Inhale 1-2 puffs into the lungs every 6 (six) hours as needed for wheezing or shortness of breath. 04/28/16   Burgess Amor, PA-C  fluticasone (FLONASE) 50 MCG/ACT nasal spray Place 1 spray into both nostrils daily.  02/10/17   Leilah Polimeni, PA-C  ketorolac (TORADOL) 10 MG tablet Take 1 tablet (10 mg total) by mouth every 6 (six) hours as needed. Patient not taking: Reported on 11/22/2016 11/11/16   Graciella Freer A, PA-C  naproxen (NAPROSYN) 500 MG tablet Take 1 tablet (500 mg total) by mouth 2 (two) times daily. Patient not taking: Reported on 11/22/2016 11/11/16   Renne Crigler, PA-C  phenol (CHLORASEPTIC) 1.4 % LIQD Use as directed 1 spray in the mouth or throat as needed for throat irritation / pain. 02/10/17   Kieryn Burtis, PA-C  promethazine-dextromethorphan (PROMETHAZINE-DM) 6.25-15 MG/5ML syrup Take 5 mLs by mouth 4 (four) times daily as needed for cough. 11/22/16   Triplett, Tammy, PA-C  traZODone (DESYREL) 100 MG tablet Take 100 mg by mouth at bedtime.    [provider]    Family History History reviewed. No pertinent family history.  Social History Social History  Substance Use Topics  . Smoking status: Current Every Day Smoker    Packs/day: 0.50    Types: Cigarettes  . Smokeless tobacco: Never Used  . Alcohol use No     Allergies   Tessalon [benzonatate]   Review of Systems Review of Systems  Constitutional: Negative for chills and fever.  HENT: Positive for congestion, ear pain, rhinorrhea, sinus pressure and sore throat. Negative for voice change.   Eyes: Negative for pain and visual disturbance.  Respiratory: Positive  for cough. Negative for chest tightness and shortness of breath.   Cardiovascular: Negative for chest pain and palpitations.  Gastrointestinal: Negative for abdominal pain, constipation, diarrhea, nausea and vomiting.     Physical Exam Updated Vital Signs BP 124/76 (BP Location: Right Arm)   Pulse (!) 116   Temp 98.6 F (37 C) (Oral)   Resp 20   SpO2 100%   Physical Exam  Constitutional: He is oriented to person, place, and time. He appears well-developed and well-nourished. No distress.  HENT:  Head: Normocephalic and atraumatic.    Right Ear: Tympanic membrane, external ear and ear canal normal.  Left Ear: Tympanic membrane, external ear and ear canal normal.  Nose: Mucosal edema and rhinorrhea present. Right sinus exhibits no maxillary sinus tenderness and no frontal sinus tenderness. Left sinus exhibits no maxillary sinus tenderness and no frontal sinus tenderness.  Mouth/Throat: Uvula is midline and mucous membranes are normal.  OP slightly erythematous with some cobblestoning. No signs of exudate. No evidence of uvular swelling, tonsillar swelling, or abscess.  Eyes: Pupils are equal, round, and reactive to light. Conjunctivae and EOM are normal.  Neck: Normal range of motion.  Cardiovascular: Regular rhythm and intact distal pulses.  Tachycardia present.   Pulmonary/Chest: Effort normal and breath sounds normal. No respiratory distress. He has no decreased breath sounds. He has no wheezes. He has no rales. He exhibits no tenderness.  Abdominal: Soft. He exhibits no distension. There is no tenderness. There is no guarding.  Musculoskeletal: Normal range of motion.  Lymphadenopathy:    He has no cervical adenopathy.  Neurological: He is alert and oriented to person, place, and time.  Skin: Skin is warm. No rash noted.  Psychiatric: He has a normal mood and affect.  Nursing note and vitals reviewed.    ED Treatments / Results  Labs (all labs ordered are listed, but only abnormal results are displayed) Labs Reviewed - No data to display  EKG  EKG Interpretation None       Radiology No results found.  Procedures Procedures (including critical care time)  Medications Ordered in ED Medications - No data to display   Initial Impression / Assessment and Plan / ED Course  I have reviewed the triage vital signs and the nursing notes.  Pertinent labs & imaging results that were available during my care of the patient were reviewed by me and considered in my medical decision making (see chart for  details).     Presenting with 1 week congestion, cough, sore throat. Multiple family members with similar symptoms. Likely viral illness. Physical exam reassuring as patient is without fever, lungs are clear, and no evidence of bacterial infection so. Discussed findings with patient. Discussed symptomatically treatment for viral illnesses. Patient to follow-up with [facility] and wellness if symptoms do not improve. At this time, I do not believe he needs a chest x-ray, as he is afebrile, nonproductive cough, clear lung sounds, and no chest tenderness, doubt pneumonia. At this time, patient presented discharge. Return precautions given. Patient states he understands and agrees to plan.  Final Clinical Impressions(s) / ED Diagnoses   Final diagnoses:  Viral URI with cough    New Prescriptions Discharge Medication List as of 02/10/2017  5:52 PM    START taking these medications   Details  fluticasone (FLONASE) 50 MCG/ACT nasal spray Place 1 spray into both nostrils daily., Starting Sat 02/10/2017, Print    phenol (CHLORASEPTIC) 1.4 % LIQD Use as directed 1 spray in  the mouth or throat as needed for throat irritation / pain., Starting Sat 02/10/2017, Print         [place], [place], PA-C 02/10/17 Redmond Pulling, MD 02/20/17 [phone]

## 2017-03-02 ENCOUNTER — Encounter (HOSPITAL_COMMUNITY): Payer: Self-pay | Admitting: Emergency Medicine

## 2017-03-02 ENCOUNTER — Emergency Department (HOSPITAL_COMMUNITY)
Admission: EM | Admit: 2017-03-02 | Discharge: 2017-03-02 | Disposition: A | Discharge status: Home / Self Care | Payer: Self-pay | Attending: Emergency Medicine | Admitting: Emergency Medicine

## 2017-03-02 DIAGNOSIS — F17210 Nicotine dependence, cigarettes, uncomplicated: Secondary | ICD-10-CM | POA: Insufficient documentation

## 2017-03-02 DIAGNOSIS — K029 Dental caries, unspecified: Secondary | ICD-10-CM | POA: Insufficient documentation

## 2017-03-02 DIAGNOSIS — Z79899 Other long term (current) drug therapy: Secondary | ICD-10-CM | POA: Insufficient documentation

## 2017-03-02 MED ORDER — AMOXICILLIN 500 MG PO CAPS: 500.0000 mg | ORAL_CAPSULE | Freq: Three times a day (TID) | ORAL | 0 refills | Status: DC

## 2017-03-02 MED ORDER — ONDANSETRON HCL 4 MG PO TABS
4.0000 mg | ORAL_TABLET | Freq: Once | ORAL | Status: AC
  Administered 2017-03-02: 4 mg via ORAL
  Filled 2017-03-02: qty 1

## 2017-03-02 MED ORDER — IBUPROFEN 800 MG PO TABS
800.0000 mg | ORAL_TABLET | Freq: Once | ORAL | Status: AC
  Administered 2017-03-02: 800 mg via ORAL
  Filled 2017-03-02: qty 1

## 2017-03-02 MED ORDER — ACETAMINOPHEN 325 MG PO TABS
650.0000 mg | ORAL_TABLET | Freq: Once | ORAL | Status: AC
  Administered 2017-03-02: 650 mg via ORAL
  Filled 2017-03-02: qty 2

## 2017-03-02 MED ORDER — CLINDAMYCIN HCL 150 MG PO CAPS
300.0000 mg | ORAL_CAPSULE | Freq: Once | ORAL | Status: AC
  Administered 2017-03-02: 300 mg via ORAL
  Filled 2017-03-02: qty 2

## 2017-03-02 MED ORDER — IBUPROFEN 600 MG PO TABS: 600.0000 mg | ORAL_TABLET | Freq: Four times a day (QID) | ORAL | 0 refills | Status: DC

## 2017-03-02 NOTE — ED Notes (Signed)
Pt started loudly crying after drinking water.  States he wants to speak with provider again to write him another rx.  States he has ibuprofen at home.  PA Beverely Pace spoke with pt prior to pt leaving.

## 2017-03-02 NOTE — ED Triage Notes (Signed)
Pt c/o dental pain x 4 days 

## 2017-03-02 NOTE — ED Provider Notes (Signed)
[facility]-EMERGENCY DEPT Provider Note   CSN: [id number] Arrival date & time: 03/02/17  1520     History   Chief Complaint Chief Complaint  Patient presents with  . Dental Problem    HPI Vincent Green is a 21 y.o. male.  Patient is a 21 year old male who presents to the emergency department with a complaint of dental problems.  The patient states he has had problems with his teeth for quite some time. He states that he has a diagnosis of posttraumatic stress syndrome and anxiety, and frequently did not take care of his teeth when he should've taken care of them. He has had the upper teeth removed. He still has several of his lower teeth. He states that these aren't infected and from time to time give problem. He does not have a primary physician to assist him with his infection or with pain. The patient states that over the last to 3 days he's been having increasing pain, stinging, and some swelling of the lower gum and of the lower jaw. No fever reported. No changes in his ability to swallow or speak. He presents now requesting assistance with this issue.      Past Medical History:  Diagnosis Date  . Anxiety   . Anxiety   . Chronic dental pain   . PTSD (post-traumatic stress disorder)   . PTSD (post-traumatic stress disorder)   . Scoliosis   . Seizures (HCC)    benzo-seizure     There are no active problems to display for this patient.   Past Surgical History:  Procedure Laterality Date  . CHOANAL ATRESIA REPAIR    . NOSE SURGERY    . SKIN GRAFT         Home Medications    Prior to Admission medications   Medication Sig Start Date End Date Taking? Authorizing Provider  acetaminophen (TYLENOL) 500 MG tablet Take 500-1,000 mg by mouth every 6 (six) hours as needed for moderate pain.     [provider]  albuterol (PROVENTIL HFA;VENTOLIN HFA) 108 (90 Base) MCG/ACT inhaler Inhale 1-2 puffs into the lungs every 6 (six) hours as needed for wheezing or  shortness of breath. 04/28/16   Burgess Amor, PA-C  albuterol (PROVENTIL HFA;VENTOLIN HFA) 108 (90 Base) MCG/ACT inhaler Inhale 1-2 puffs into the lungs every 6 (six) hours as needed for wheezing or shortness of breath. 02/10/17   Deatra Canter, FNP  amoxicillin (AMOXIL) 500 MG capsule Take 1 capsule (500 mg total) by mouth 3 (three) times daily. 03/02/17   Ivery Quale, PA-C  azithromycin (ZITHROMAX) 250 MG tablet Take 1 tablet (250 mg total) by mouth daily. Take first 2 tablets together, then 1 every day until finished. 02/10/17   Deatra Canter, FNP  fluticasone (FLONASE) 50 MCG/ACT nasal spray Place 1 spray into both nostrils daily. 02/10/17   Caccavale, Sophia, PA-C  guaiFENesin-codeine (CHERATUSSIN AC) 100-10 MG/5ML syrup Take 5 mLs by mouth 3 (three) times daily as needed for cough. 02/10/17   Deatra Canter, FNP  ibuprofen (ADVIL,MOTRIN) 600 MG tablet Take 1 tablet (600 mg total) by mouth 4 (four) times daily. 03/02/17   Ivery Quale, PA-C  ketorolac (TORADOL) 10 MG tablet Take 1 tablet (10 mg total) by mouth every 6 (six) hours as needed. Patient not taking: Reported on 11/22/2016 11/11/16   Maxwell Caul, PA-C  methylPREDNISolone (MEDROL DOSEPAK) 4 MG TBPK tablet Take 6-5-4-3-2-1 po qd 02/10/17   Deatra Canter, FNP  naproxen (NAPROSYN) 500 MG  tablet Take 1 tablet (500 mg total) by mouth 2 (two) times daily. Patient not taking: Reported on 11/22/2016 11/11/16   Renne Crigler, PA-C  phenol (CHLORASEPTIC) 1.4 % LIQD Use as directed 1 spray in the mouth or throat as needed for throat irritation / pain. 02/10/17   Caccavale, Sophia, PA-C  promethazine-dextromethorphan (PROMETHAZINE-DM) 6.25-15 MG/5ML syrup Take 5 mLs by mouth 4 (four) times daily as needed for cough. 11/22/16   Triplett, Tammy, PA-C  traZODone (DESYREL) 100 MG tablet Take 100 mg by mouth at bedtime.    [provider]    Family History No family history on file.  Social History Social History  Substance Use  Topics  . Smoking status: Current Every Day Smoker    Packs/day: 0.50    Types: Cigarettes  . Smokeless tobacco: Never Used  . Alcohol use No     Allergies   Tessalon [benzonatate]   Review of Systems Review of Systems  Constitutional: Negative for activity change.       All ROS Neg except as noted in HPI  HENT: Positive for dental problem. Negative for nosebleeds.   Eyes: Negative for photophobia and discharge.  Respiratory: Negative for cough, shortness of breath and wheezing.   Cardiovascular: Negative for chest pain and palpitations.  Gastrointestinal: Negative for abdominal pain and blood in stool.  Genitourinary: Negative for dysuria, frequency and hematuria.  Musculoskeletal: Negative for arthralgias, back pain and neck pain.  Skin: Negative.   Neurological: Negative for dizziness, seizures and speech difficulty.  Psychiatric/Behavioral: Negative for confusion and hallucinations. The patient is nervous/anxious.      Physical Exam Updated Vital Signs BP 119/73   Pulse 71   Temp 98.7 F (37.1 C)   Resp 18   Ht 6' (1.829 m)   Wt 73.9 kg (163 lb)   SpO2 97%   BMI 22.11 kg/m   Physical Exam  Constitutional: He is oriented to person, place, and time. He appears well-developed and well-nourished.  Non-toxic appearance.  HENT:  Head: Normocephalic.  Right Ear: Tympanic membrane and external ear normal.  Left Ear: Tympanic membrane and external ear normal.  There are multiple dental caries appreciated. Particularly of the lower jaw  Area. No visible abscess appreciated. Airway is patent.  Eyes: Pupils are equal, round, and reactive to light. EOM and lids are normal.  Neck: Normal range of motion. Neck supple. Carotid bruit is not present.  Cardiovascular: Normal rate, regular rhythm, normal heart sounds, intact distal pulses and normal pulses.   Pulmonary/Chest: Breath sounds normal. No respiratory distress.  Abdominal: Soft. Bowel sounds are normal. There is no  tenderness. There is no guarding.  Musculoskeletal: Normal range of motion.  Lymphadenopathy:       Head (right side): No submandibular adenopathy present.       Head (left side): No submandibular adenopathy present.    He has no cervical adenopathy.  Neurological: He is alert and oriented to person, place, and time. He has normal strength. No cranial nerve deficit or sensory deficit.  Skin: Skin is warm and dry.  Psychiatric: He has a normal mood and affect. His speech is normal.  Nursing note and vitals reviewed.    ED Treatments / Results  Labs (all labs ordered are listed, but only abnormal results are displayed) Labs Reviewed - No data to display  EKG  EKG Interpretation None       Radiology No results found.  Procedures Procedures (including critical care time)  Medications Ordered in  ED Medications  acetaminophen (TYLENOL) tablet 650 mg (not administered)  ibuprofen (ADVIL,MOTRIN) tablet 800 mg (not administered)  ondansetron (ZOFRAN) tablet 4 mg (not administered)  clindamycin (CLEOCIN) capsule 300 mg (not administered)     Initial Impression / Assessment and Plan / ED Course  I have reviewed the triage vital signs and the nursing notes.  Pertinent labs & imaging results that were available during my care of the patient were reviewed by me and considered in my medical decision making (see chart for details).       Final Clinical Impressions(s) / ED Diagnoses MDM Vital signs within normal limits. Patient has multiple dental caries of the lower jaw area. No visible abscess appreciated. No evidence for Ludwig's Angina. patient will be treated with Amoxil, and ibuprofen. He will use Tylenol in between the ibuprofen doses if needed for pain. I strongly encouraged patient to see a dentist as soon as possible. I've given him dental resource information.    Final diagnoses:  Dental caries    New Prescriptions New Prescriptions   AMOXICILLIN (AMOXIL) 500 MG  CAPSULE    Take 1 capsule (500 mg total) by mouth 3 (three) times daily.   IBUPROFEN (ADVIL,MOTRIN) 600 MG TABLET    Take 1 tablet (600 mg total) by mouth 4 (four) times daily.     Ivery Quale, PA-C 03/02/17 1656    Bethann Berkshire, MD 03/03/17 1226

## 2017-03-02 NOTE — Discharge Instructions (Signed)
Your vital signs are within normal limits. It is important that she see a dentist systems possible. Please see your dentist, or one of the dentist on the dental resource guide as soon as possible. Use amoxicillin 3 times daily. Use ibuprofen with breakfast, lunch, dinner, and at bedtime. May use Tylenol Extra Strength in between the ibuprofen doses if needed for additional pain.

## 2018-04-10 ENCOUNTER — Other Ambulatory Visit: Payer: Self-pay

## 2018-04-10 ENCOUNTER — Encounter (HOSPITAL_COMMUNITY): Payer: Self-pay | Admitting: Emergency Medicine

## 2018-04-10 ENCOUNTER — Emergency Department (HOSPITAL_COMMUNITY)
Admission: EM | Admit: 2018-04-10 | Discharge: 2018-04-10 | Disposition: A | Discharge status: Home / Self Care | Payer: Self-pay | Attending: Emergency Medicine | Admitting: Emergency Medicine

## 2018-04-10 DIAGNOSIS — F17210 Nicotine dependence, cigarettes, uncomplicated: Secondary | ICD-10-CM | POA: Insufficient documentation

## 2018-04-10 DIAGNOSIS — Z79899 Other long term (current) drug therapy: Secondary | ICD-10-CM | POA: Insufficient documentation

## 2018-04-10 DIAGNOSIS — K920 Hematemesis: Secondary | ICD-10-CM

## 2018-04-10 DIAGNOSIS — K0889 Other specified disorders of teeth and supporting structures: Secondary | ICD-10-CM

## 2018-04-10 LAB — COMPREHENSIVE METABOLIC PANEL
ALT: 15 U/L (ref 0–44)
ANION GAP: 8 (ref 5–15)
AST: 19 U/L (ref 15–41)
Albumin: 4.3 g/dL (ref 3.5–5.0)
Alkaline Phosphatase: 57 U/L (ref 38–126)
BUN: 8 mg/dL (ref 6–20)
CO2: 27 mmol/L (ref 22–32)
Calcium: 9.7 mg/dL (ref 8.9–10.3)
Chloride: 103 mmol/L (ref 98–111)
Creatinine, Ser: 1.36 mg/dL — ABNORMAL HIGH (ref 0.61–1.24)
GFR calc Af Amer: >60 mL/min (ref >60)
GLUCOSE: 113 mg/dL — AB (ref 70–99)
POTASSIUM: 4.2 mmol/L (ref 3.5–5.1)
Sodium: 138 mmol/L (ref 135–145)
TOTAL PROTEIN: 7.2 g/dL (ref 6.5–8.1)
Total Bilirubin: 0.9 mg/dL (ref 0.3–1.2)

## 2018-04-10 LAB — CBC
HCT: 46 % (ref 39.0–52.0)
HEMOGLOBIN: 15.1 g/dL (ref 13.0–17.0)
MCH: 29.8 pg (ref 26.0–34.0)
MCHC: 32.8 g/dL (ref 30.0–36.0)
MCV: 90.7 fL (ref 80.0–100.0)
Platelets: 257 10*3/uL (ref 150–400)
RBC: 5.07 MIL/uL (ref 4.22–5.81)
RDW: 12.9 % (ref 11.5–15.5)
WBC: 6.3 10*3/uL (ref 4.0–10.5)
nRBC: 0 % (ref 0.0–0.2)

## 2018-04-10 LAB — LIPASE, BLOOD: LIPASE: 31 U/L (ref 11–51)

## 2018-04-10 MED ORDER — ONDANSETRON 4 MG PO TBDP: 4.0000 mg | ORAL_TABLET | ORAL | 0 refills | Status: DC | PRN

## 2018-04-10 MED ORDER — AMOXICILLIN 500 MG PO TABS: 1000.0000 mg | ORAL_TABLET | Freq: Two times a day (BID) | ORAL | 0 refills | Status: DC

## 2018-04-10 MED ORDER — OMEPRAZOLE 20 MG PO CPDR: 20.0000 mg | DELAYED_RELEASE_CAPSULE | Freq: Every day | ORAL | 1 refills | Status: DC

## 2018-04-10 NOTE — Discharge Instructions (Signed)
1.  You need to find a family doctor.  Use the referral number in your discharge instructions.  You have been given a resource guide dental clinics.  Try to find one as soon as possible. 2.  Start taking Prilosec daily.  Use Zofran if needed.

## 2018-04-10 NOTE — ED Provider Notes (Signed)
[facility]-EMERGENCY DEPT Provider Note   CSN: [id number] Arrival date & time: 04/10/18  1946     History   Chief Complaint Chief Complaint  Patient presents with  . Hematemesis  . Dental Pain    HPI Vincent Green is a 22 y.o. male.  HPI Has several complaints.  One he reports he has a dental abscess.  Ports it is painful and he was supposed to have got some antibiotics at another hospital but they lost his prescription.  He also reports he has been vomiting blood.  He reports that he cannot seem to eat very much without vomiting.  Reports emesis is red.  He reports he is also had dark stool.  He reports he had a history of an ulcer in the past.  He has not been taking any ongoing PPI or H2 blocker.  He is here with his mother.  She reports she just gave him a dose today when he came to her house. Past Medical History:  Diagnosis Date  . Anxiety   . Anxiety   . Chronic dental pain   . PTSD (post-traumatic stress disorder)   . PTSD (post-traumatic stress disorder)   . Scoliosis   . Seizures (HCC)    benzo-seizure     There are no active problems to display for this patient.   Past Surgical History:  Procedure Laterality Date  . CHOANAL ATRESIA REPAIR    . NOSE SURGERY    . SKIN GRAFT          Home Medications    Prior to Admission medications   Medication Sig Start Date End Date Taking? Authorizing Provider  acetaminophen (TYLENOL) 500 MG tablet Take 500-1,000 mg by mouth every 6 (six) hours as needed for moderate pain.     [provider]  albuterol (PROVENTIL HFA;VENTOLIN HFA) 108 (90 Base) MCG/ACT inhaler Inhale 1-2 puffs into the lungs every 6 (six) hours as needed for wheezing or shortness of breath. 04/28/16   Burgess Amor, PA-C  albuterol (PROVENTIL HFA;VENTOLIN HFA) 108 (90 Base) MCG/ACT inhaler Inhale 1-2 puffs into the lungs every 6 (six) hours as needed for wheezing or shortness of breath. 02/10/17   Deatra Canter,  FNP  amoxicillin (AMOXIL) 500 MG capsule Take 1 capsule (500 mg total) by mouth 3 (three) times daily. 03/02/17   Ivery Quale, PA-C  amoxicillin (AMOXIL) 500 MG tablet Take 2 tablets (1,000 mg total) by mouth 2 (two) times daily. 04/10/18   Arby Barrette, MD  azithromycin (ZITHROMAX) 250 MG tablet Take 1 tablet (250 mg total) by mouth daily. Take first 2 tablets together, then 1 every day until finished. 02/10/17   Deatra Canter, FNP  fluticasone (FLONASE) 50 MCG/ACT nasal spray Place 1 spray into both nostrils daily. 02/10/17   Caccavale, Sophia, PA-C  guaiFENesin-codeine (CHERATUSSIN AC) 100-10 MG/5ML syrup Take 5 mLs by mouth 3 (three) times daily as needed for cough. 02/10/17   Deatra Canter, FNP  ibuprofen (ADVIL,MOTRIN) 600 MG tablet Take 1 tablet (600 mg total) by mouth 4 (four) times daily. 03/02/17   Ivery Quale, PA-C  ketorolac (TORADOL) 10 MG tablet Take 1 tablet (10 mg total) by mouth every 6 (six) hours as needed. Patient not taking: Reported on 11/22/2016 11/11/16   Maxwell Caul, PA-C  methylPREDNISolone (MEDROL DOSEPAK) 4 MG TBPK tablet Take 6-5-4-3-2-1 po qd 02/10/17   Deatra Canter, FNP  naproxen (NAPROSYN) 500 MG tablet Take 1 tablet (500 mg total) by  mouth 2 (two) times daily. Patient not taking: Reported on 11/22/2016 11/11/16   Renne Crigler, PA-C  omeprazole (PRILOSEC) 20 MG capsule Take 1 capsule (20 mg total) by mouth daily. 04/10/18   Arby Barrette, MD  ondansetron (ZOFRAN ODT) 4 MG disintegrating tablet Take 1 tablet (4 mg total) by mouth every 4 (four) hours as needed for nausea or vomiting. 04/10/18   Arby Barrette, MD  phenol (CHLORASEPTIC) 1.4 % LIQD Use as directed 1 spray in the mouth or throat as needed for throat irritation / pain. 02/10/17   Caccavale, Sophia, PA-C  promethazine-dextromethorphan (PROMETHAZINE-DM) 6.25-15 MG/5ML syrup Take 5 mLs by mouth 4 (four) times daily as needed for cough. 11/22/16   Triplett, Tammy, PA-C  traZODone (DESYREL) 100  MG tablet Take 100 mg by mouth at bedtime.    [provider]    Family History No family history on file.  Social History Social History   Tobacco Use  . Smoking status: Current Every Day Smoker    Packs/day: 0.50    Types: Cigarettes  . Smokeless tobacco: Never Used  Substance Use Topics  . Alcohol use: No  . Drug use: Yes    Types: Benzodiazepines     Allergies   Tessalon [benzonatate]   Review of Systems Review of Systems  10 Systems reviewed and are negative for acute change except as noted in the HPI.    Physical Exam Updated Vital Signs BP 116/79   Pulse (!) 109   Temp 98.6 F (37 C) (Oral)   Resp 15   Ht 6' (1.829 m)   Wt 72.6 kg   SpO2 100%   BMI 21.70 kg/m   Physical Exam  Constitutional: He is oriented to person, place, and time. He appears well-developed and well-nourished. No distress.  HENT:  Head: Normocephalic and atraumatic.  Patient is edentulous on the top.  He has some remaining teeth from the lower front.  They are all quite decayed and in very poor condition.  The tooth he indicates is not significantly different from any other surrounding teeth.  No facial swelling associated.  He has moderate gingivitis over the remaining teeth.  Not pacifically focal to the tooth that he indicates painful.  Eyes: EOM are normal.  Cardiovascular: Normal rate, regular rhythm, normal heart sounds and intact distal pulses.  Pulmonary/Chest: Effort normal and breath sounds normal.  Abdominal: Soft. Bowel sounds are normal. He exhibits no distension. There is no tenderness. There is no guarding.  Musculoskeletal: Normal range of motion. He exhibits no edema or tenderness.  Neurological: He is alert and oriented to person, place, and time. No cranial nerve deficit. He exhibits normal muscle tone. Coordination normal.  Skin: Skin is warm and dry.     ED Treatments / Results  Labs (all labs ordered are listed, but only abnormal results are  displayed) Labs Reviewed  COMPREHENSIVE METABOLIC PANEL - Abnormal; Notable for the following components:      Result Value   Glucose, Bld 113 (*)    Creatinine, Ser 1.36 (*)    All other components within normal limits  LIPASE, BLOOD  CBC  URINALYSIS, ROUTINE W REFLEX MICROSCOPIC    EKG None  Radiology No results found.  Procedures Procedures (including critical care time)  Medications Ordered in ED Medications - No data to display   Initial Impression / Assessment and Plan / ED Course  I have reviewed the triage vital signs and the nursing notes.  Pertinent labs & imaging results  that were available during my care of the patient were reviewed by me and considered in my medical decision making (see chart for details).   Patient has significant chronic dental decay.  He indicates one area of increased pain.  No associated facial swelling.  Will prescribe amoxicillin.  He is advised he must try to follow-up with a dentist as soon as possible.  He will ultimately need all of his remaining teeth extracted.  Patient reports vomiting up blood.  He reports he is vomiting after eating as well.  Hemoglobin is at greater than 15 without signs of anemia.  Patient does not show signs of clinical hypovolemia.  He reports a history of an ulcer.  At this time he is counseled to go back on a daily PPI.  He is given Zofran if needed for nausea.  Patient is discharged in stable condition.  Final Clinical Impressions(s) / ED Diagnoses   Final diagnoses:  Hematemesis with nausea  Pain, dental    ED Discharge Orders         Ordered    omeprazole (PRILOSEC) 20 MG capsule  Daily     04/10/18 2308    amoxicillin (AMOXIL) 500 MG tablet  2 times daily     04/10/18 2308    ondansetron (ZOFRAN ODT) 4 MG disintegrating tablet  Every 4 hours PRN     04/10/18 2308           Arby Barrette, MD 04/10/18 2316

## 2018-04-10 NOTE — ED Notes (Signed)
Security notified patient in room per Care Plan-primary RN Jeanice Lim also aware

## 2018-04-10 NOTE — ED Triage Notes (Addendum)
Patient c/o dark coffee ground emesis x2 weeks. Also reports dark stools. Reports hx "bleeding ulcers." C/o left lower dental pain x1 week. Hx chronic dental pain.

## 2018-09-13 ENCOUNTER — Other Ambulatory Visit: Payer: Self-pay

## 2018-09-13 ENCOUNTER — Encounter (HOSPITAL_COMMUNITY): Payer: Self-pay

## 2018-09-13 ENCOUNTER — Emergency Department (HOSPITAL_COMMUNITY): Payer: Self-pay

## 2018-09-13 ENCOUNTER — Emergency Department (HOSPITAL_COMMUNITY)
Admission: EM | Admit: 2018-09-13 | Discharge: 2018-09-13 | Disposition: A | Discharge status: Home / Self Care | Payer: Self-pay | Attending: Emergency Medicine | Admitting: Emergency Medicine

## 2018-09-13 DIAGNOSIS — F17210 Nicotine dependence, cigarettes, uncomplicated: Secondary | ICD-10-CM | POA: Insufficient documentation

## 2018-09-13 DIAGNOSIS — R569 Unspecified convulsions: Secondary | ICD-10-CM | POA: Insufficient documentation

## 2018-09-13 DIAGNOSIS — Z79899 Other long term (current) drug therapy: Secondary | ICD-10-CM | POA: Insufficient documentation

## 2018-09-13 DIAGNOSIS — R05 Cough: Secondary | ICD-10-CM | POA: Insufficient documentation

## 2018-09-13 LAB — CBC WITH DIFFERENTIAL/PLATELET
ABS IMMATURE GRANULOCYTES: 0.17 10*3/uL — AB (ref 0.00–0.07)
Basophils Absolute: 0 10*3/uL (ref 0.0–0.1)
Basophils Relative: 0 %
EOS ABS: 0 10*3/uL (ref 0.0–0.5)
Eosinophils Relative: 0 %
HEMATOCRIT: 32.1 % — AB (ref 39.0–52.0)
Hemoglobin: 10.1 g/dL — ABNORMAL LOW (ref 13.0–17.0)
Immature Granulocytes: 1 %
LYMPHS ABS: 1.1 10*3/uL (ref 0.7–4.0)
Lymphocytes Relative: 8 %
MCH: 27.2 pg (ref 26.0–34.0)
MCHC: 31.5 g/dL (ref 30.0–36.0)
MCV: 86.3 fL (ref 80.0–100.0)
MONO ABS: 1.3 10*3/uL — AB (ref 0.1–1.0)
MONOS PCT: 10 %
NEUTROS ABS: 10.6 10*3/uL — AB (ref 1.7–7.7)
Neutrophils Relative %: 81 %
Platelets: 276 10*3/uL (ref 150–400)
RBC: 3.72 MIL/uL — ABNORMAL LOW (ref 4.22–5.81)
RDW: 15.4 % (ref 11.5–15.5)
WBC: 13.2 10*3/uL — ABNORMAL HIGH (ref 4.0–10.5)
nRBC: 0 % (ref 0.0–0.2)

## 2018-09-13 LAB — RAPID URINE DRUG SCREEN, HOSP PERFORMED
AMPHETAMINES: NOT DETECTED
BENZODIAZEPINES: POSITIVE — AB
Barbiturates: NOT DETECTED
COCAINE: NOT DETECTED
Opiates: NOT DETECTED
TETRAHYDROCANNABINOL: NOT DETECTED

## 2018-09-13 LAB — BASIC METABOLIC PANEL
Anion gap: 8 (ref 5–15)
BUN: 37 mg/dL — AB (ref 6–20)
CALCIUM: 8.2 mg/dL — AB (ref 8.9–10.3)
CO2: 23 mmol/L (ref 22–32)
Chloride: 105 mmol/L (ref 98–111)
Creatinine, Ser: 1.39 mg/dL — ABNORMAL HIGH (ref 0.61–1.24)
GFR calc Af Amer: >60 mL/min (ref >60)
GLUCOSE: 106 mg/dL — AB (ref 70–99)
Potassium: 4.4 mmol/L (ref 3.5–5.1)
Sodium: 136 mmol/L (ref 135–145)

## 2018-09-13 LAB — URINALYSIS, ROUTINE W REFLEX MICROSCOPIC
Bilirubin Urine: NEGATIVE
GLUCOSE, UA: NEGATIVE mg/dL
Hgb urine dipstick: NEGATIVE
KETONES UR: NEGATIVE mg/dL
LEUKOCYTE UA: NEGATIVE
NITRITE: NEGATIVE
PH: 5 (ref 5.0–8.0)
Protein, ur: NEGATIVE mg/dL
SPECIFIC GRAVITY, URINE: 1.031 — AB (ref 1.005–1.030)

## 2018-09-13 LAB — ETHANOL: Alcohol, Ethyl (B): <10 mg/dL (ref <10)

## 2018-09-13 MED ORDER — LEVETIRACETAM 500 MG PO TABS: 500.0000 mg | ORAL_TABLET | Freq: Two times a day (BID) | ORAL | 0 refills | Status: DC

## 2018-09-13 MED ORDER — ONDANSETRON 8 MG PO TBDP: ORAL_TABLET | ORAL | 0 refills | Status: DC

## 2018-09-13 MED ORDER — HALOPERIDOL 2 MG PO TABS
2.0000 mg | ORAL_TABLET | Freq: Once | ORAL | Status: AC
  Administered 2018-09-13: 2 mg via ORAL

## 2018-09-13 MED ORDER — HALOPERIDOL 5 MG PO TABS
ORAL_TABLET | ORAL | Status: AC
  Filled 2018-09-13: qty 1

## 2018-09-13 MED ORDER — LEVETIRACETAM IN NACL 1000 MG/100ML IV SOLN
1000.0000 mg | Freq: Once | INTRAVENOUS | Status: AC
  Administered 2018-09-13: 1000 mg via INTRAVENOUS
  Filled 2018-09-13: qty 100

## 2018-09-13 MED ORDER — HALOPERIDOL 2 MG PO TABS: 2.0000 mg | ORAL_TABLET | Freq: Three times a day (TID) | ORAL | 0 refills | Status: DC

## 2018-09-13 NOTE — ED Notes (Signed)
Pt reports taking the following medications daily as prescribed:  Albuterol MDI, Abilify, Haldol (ran out of yesterday per report), flexeril, - pt unsure of doses.  Dr Judd Lien made aware.

## 2018-09-13 NOTE — ED Provider Notes (Signed)
[facility] EMERGENCY DEPARTMENT Provider Note   CSN: [id number] Arrival date & time: 09/13/18  0014    History   Chief Complaint Chief Complaint  Patient presents with  . Seizures    HPI Vincent Green is a 23 y.o. male.     Patient is a 23 year old male with past medical history of PTSD, anxiety, and chronic pain.  He presents today for evaluation of seizure.  He reports 3 seizures today, each lasting approximately 45 seconds and comprised of generalized shaking.  He reports urinary incontinence.  He also reports a dry cough and "burning in his chest" for the past 2 days.  He denies any ill contacts or recent travel.  The history is provided by the patient.  Seizures  Seizure activity on arrival: no   Seizure type:  Grand mal Initial focality:  None Postictal symptoms: no confusion     Past Medical History:  Diagnosis Date  . Anxiety   . Anxiety   . Chronic dental pain   . PTSD (post-traumatic stress disorder)   . PTSD (post-traumatic stress disorder)   . Scoliosis   . Seizures (HCC)    benzo-seizure     There are no active problems to display for this patient.   Past Surgical History:  Procedure Laterality Date  . CHOANAL ATRESIA REPAIR    . NOSE SURGERY    . SKIN GRAFT          Home Medications    Prior to Admission medications   Medication Sig Start Date End Date Taking? Authorizing Provider  acetaminophen (TYLENOL) 500 MG tablet Take 500-1,000 mg by mouth every 6 (six) hours as needed for moderate pain.     [provider]  albuterol (PROVENTIL HFA;VENTOLIN HFA) 108 (90 Base) MCG/ACT inhaler Inhale 1-2 puffs into the lungs every 6 (six) hours as needed for wheezing or shortness of breath. 04/28/16   Burgess Amor, PA-C  albuterol (PROVENTIL HFA;VENTOLIN HFA) 108 (90 Base) MCG/ACT inhaler Inhale 1-2 puffs into the lungs every 6 (six) hours as needed for wheezing or shortness of breath. 02/10/17   Deatra Canter, FNP  amoxicillin (AMOXIL)  500 MG capsule Take 1 capsule (500 mg total) by mouth 3 (three) times daily. 03/02/17   Ivery Quale, PA-C  amoxicillin (AMOXIL) 500 MG tablet Take 2 tablets (1,000 mg total) by mouth 2 (two) times daily. 04/10/18   Arby Barrette, MD  azithromycin (ZITHROMAX) 250 MG tablet Take 1 tablet (250 mg total) by mouth daily. Take first 2 tablets together, then 1 every day until finished. 02/10/17   Deatra Canter, FNP  fluticasone (FLONASE) 50 MCG/ACT nasal spray Place 1 spray into both nostrils daily. 02/10/17   Caccavale, Sophia, PA-C  guaiFENesin-codeine (CHERATUSSIN AC) 100-10 MG/5ML syrup Take 5 mLs by mouth 3 (three) times daily as needed for cough. 02/10/17   Deatra Canter, FNP  ibuprofen (ADVIL,MOTRIN) 600 MG tablet Take 1 tablet (600 mg total) by mouth 4 (four) times daily. 03/02/17   Ivery Quale, PA-C  ketorolac (TORADOL) 10 MG tablet Take 1 tablet (10 mg total) by mouth every 6 (six) hours as needed. Patient not taking: Reported on 11/22/2016 11/11/16   Maxwell Caul, PA-C  methylPREDNISolone (MEDROL DOSEPAK) 4 MG TBPK tablet Take 6-5-4-3-2-1 po qd 02/10/17   Deatra Canter, FNP  naproxen (NAPROSYN) 500 MG tablet Take 1 tablet (500 mg total) by mouth 2 (two) times daily. Patient not taking: Reported on 11/22/2016 11/11/16   Renne Crigler, PA-C  omeprazole (PRILOSEC) 20 MG capsule Take 1 capsule (20 mg total) by mouth daily. 04/10/18   Arby Barrette, MD  ondansetron (ZOFRAN ODT) 4 MG disintegrating tablet Take 1 tablet (4 mg total) by mouth every 4 (four) hours as needed for nausea or vomiting. 04/10/18   Arby Barrette, MD  phenol (CHLORASEPTIC) 1.4 % LIQD Use as directed 1 spray in the mouth or throat as needed for throat irritation / pain. 02/10/17   Caccavale, Sophia, PA-C  promethazine-dextromethorphan (PROMETHAZINE-DM) 6.25-15 MG/5ML syrup Take 5 mLs by mouth 4 (four) times daily as needed for cough. 11/22/16   Triplett, Tammy, PA-C  traZODone (DESYREL) 100 MG tablet Take 100 mg by  mouth at bedtime.    [provider]    Family History No family history on file.  Social History Social History   Tobacco Use  . Smoking status: Current Every Day Smoker    Packs/day: 0.50    Types: Cigarettes  . Smokeless tobacco: Never Used  Substance Use Topics  . Alcohol use: No  . Drug use: Yes    Types: Benzodiazepines, Marijuana    Comment: not currently per pt report 09/13/18     Allergies   Tessalon [benzonatate]   Review of Systems Review of Systems  Neurological: Positive for seizures.  All other systems reviewed and are negative.    Physical Exam Updated Vital Signs BP 119/71 (BP Location: Left Arm)   Pulse (!) 135   Temp 99.7 F (37.6 C) (Oral)   Resp 19   Ht 6' (1.829 m)   Wt 69.4 kg   SpO2 98%   BMI 20.75 kg/m   Physical Exam Vitals signs and nursing note reviewed.  Constitutional:      General: He is not in acute distress.    Appearance: He is well-developed. He is not diaphoretic.  HENT:     Head: Normocephalic and atraumatic.  Eyes:     Extraocular Movements: Extraocular movements intact.     Pupils: Pupils are equal, round, and reactive to light.  Neck:     Musculoskeletal: Normal range of motion and neck supple.  Cardiovascular:     Rate and Rhythm: Normal rate and regular rhythm.     Heart sounds: No murmur. No friction rub.  Pulmonary:     Effort: Pulmonary effort is normal. No respiratory distress.     Breath sounds: Normal breath sounds. No wheezing or rales.  Abdominal:     General: Bowel sounds are normal. There is no distension.     Palpations: Abdomen is soft.     Tenderness: There is no abdominal tenderness.  Musculoskeletal: Normal range of motion.  Skin:    General: Skin is warm and dry.  Neurological:     General: No focal deficit present.     Mental Status: He is alert and oriented to person, place, and time.     Cranial Nerves: No cranial nerve deficit.     Sensory: No sensory deficit.      Coordination: Coordination normal.      ED Treatments / Results  Labs (all labs ordered are listed, but only abnormal results are displayed) Labs Reviewed  BASIC METABOLIC PANEL  CBC WITH DIFFERENTIAL/PLATELET  ETHANOL  URINALYSIS, ROUTINE W REFLEX MICROSCOPIC  RAPID URINE DRUG SCREEN, HOSP PERFORMED    EKG EKG Interpretation  Date/Time:  Friday September 13 2018 00:22:30 EDT Ventricular Rate:  132 PR Interval:    QRS Duration: 111 QT Interval:  297 QTC Calculation: 441 R  Axis:   78 Text Interpretation:  Sinus tachycardia RSR' in V1 or V2, right VCD or RVH Confirmed by Geoffery Lyons ([id number]) on 09/13/2018 2:51:51 AM   Radiology No results found.  Procedures Procedures (including critical care time)  Medications Ordered in ED Medications - No data to display   Initial Impression / Assessment and Plan / ED Course  I have reviewed the triage vital signs and the nursing notes.  Pertinent labs & imaging results that were available during my care of the patient were reviewed by me and considered in my medical decision making (see chart for details).  Patient presents stating that he has had 3 seizures today.  He was brought here by EMS after the third episode.  He has had no further seizure activity in the ER and is neurologically intact.  His work-up is unremarkable including laboratory studies, head CT.  Patient does report some dry cough recently, however has no known Covid-19 exposures or travel.    He will be given keppra and will have to make arrangements for follow up with neurology as an outpatient.  Final Clinical Impressions(s) / ED Diagnoses   Final diagnoses:  None    ED Discharge Orders    None       Geoffery Lyons, MD 09/13/18 [phone]

## 2018-09-13 NOTE — Discharge Instructions (Addendum)
Begin taking Keppra as prescribed.  This is a medication that should hopefully prevent future seizures.  Resume taking Haldol as previously prescribed.  Follow-up with neurology as an outpatient.  The contact information for Dr. Gerilyn Pilgrim has been provided in this discharge summary for you to call and make these arrangements.  You are not to drive or swim until cleared by Dr. Gerilyn Pilgrim.

## 2018-09-13 NOTE — ED Notes (Signed)
ED Provider at bedside. 

## 2018-09-13 NOTE — ED Notes (Signed)
Pt requesting dose of Haldol to be administered here since he is out- Dr Judd Lien aware.

## 2018-09-13 NOTE — ED Triage Notes (Signed)
Pt reports having seizure 3 times earlier today while at bank with father, pt reports having episode of incontinence (urine) during seizure. Pt father called EMS out tonight for seizure while at home lasting approx 45 seconds. Pt alert and oriented x 4. Pt reports dry cough, "lungs burning", and chest pain that started yesterday. Pt denies travel outside of [place] or [place] in the past month. Pt denies being around large crowds recently.

## 2018-10-31 ENCOUNTER — Other Ambulatory Visit: Payer: Self-pay

## 2018-10-31 ENCOUNTER — Encounter (HOSPITAL_COMMUNITY): Payer: Self-pay

## 2018-10-31 ENCOUNTER — Emergency Department (HOSPITAL_COMMUNITY)
Admission: EM | Admit: 2018-10-31 | Discharge: 2018-10-31 | Disposition: A | Discharge status: Home / Self Care | Payer: Medicaid Other | Attending: Emergency Medicine | Admitting: Emergency Medicine

## 2018-10-31 DIAGNOSIS — Z760 Encounter for issue of repeat prescription: Secondary | ICD-10-CM | POA: Insufficient documentation

## 2018-10-31 DIAGNOSIS — F17210 Nicotine dependence, cigarettes, uncomplicated: Secondary | ICD-10-CM | POA: Diagnosis not present

## 2018-10-31 DIAGNOSIS — F419 Anxiety disorder, unspecified: Secondary | ICD-10-CM | POA: Diagnosis not present

## 2018-10-31 DIAGNOSIS — Z79899 Other long term (current) drug therapy: Secondary | ICD-10-CM | POA: Insufficient documentation

## 2018-10-31 MED ORDER — AMITRIPTYLINE HCL 25 MG PO TABS
25.0000 mg | ORAL_TABLET | Freq: Once | ORAL | Status: AC
  Administered 2018-10-31: 14:00:00 25 mg via ORAL
  Filled 2018-10-31: qty 1

## 2018-10-31 MED ORDER — AMITRIPTYLINE HCL 25 MG PO TABS: 25.0000 mg | ORAL_TABLET | Freq: Every day | ORAL | 0 refills | Status: DC

## 2018-10-31 MED ORDER — LEVETIRACETAM 500 MG PO TABS: 500.0000 mg | ORAL_TABLET | Freq: Two times a day (BID) | ORAL | 0 refills | Status: DC

## 2018-10-31 MED ORDER — CHLORDIAZEPOXIDE HCL 25 MG PO CAPS
50.0000 mg | ORAL_CAPSULE | Freq: Once | ORAL | Status: AC
  Administered 2018-10-31: 50 mg via ORAL
  Filled 2018-10-31: qty 2

## 2018-10-31 MED ORDER — LEVETIRACETAM 500 MG PO TABS
500.0000 mg | ORAL_TABLET | Freq: Once | ORAL | Status: AC
  Administered 2018-10-31: 14:00:00 500 mg via ORAL
  Filled 2018-10-31: qty 1

## 2018-10-31 MED ORDER — CHLORDIAZEPOXIDE HCL 25 MG PO CAPS: ORAL_CAPSULE | ORAL | 0 refills | Status: DC

## 2018-10-31 NOTE — ED Provider Notes (Signed)
[facility]-EMERGENCY DEPT Provider Note   CSN: [id number] Arrival date & time: 10/31/18  1342    History   Chief Complaint Chief Complaint  Patient presents with   Medication Refill    HPI Vincent Green is a 23 y.o. male.     23 yo M with a cc of running out of his medications.  The patient apparently has got into an argument with his father and has run away from home.  He has now been living in a hotel in his car.  He left his medications at home and has the feeling like he may have a seizure.  He is requesting a refill of his Keppra and his amitriptyline.  Also states that he has been chronically on benzodiazepines and has started to feel a bit anxious from not having them.  He denies infectious symptoms denies cough congestion or fever denies abdominal pain vomiting or diarrhea.  The history is provided by the patient.  Medication Refill  Illness  Severity:  Moderate Onset quality:  Sudden Duration:  2 days Timing:  Constant Progression:  Worsening Chronicity:  New Associated symptoms: no abdominal pain, no chest pain, no congestion, no diarrhea, no fever, no headaches, no myalgias, no rash, no shortness of breath and no vomiting     Past Medical History:  Diagnosis Date   Anxiety    Anxiety    Chronic dental pain    PTSD (post-traumatic stress disorder)    PTSD (post-traumatic stress disorder)    Scoliosis    Seizures (HCC)    benzo-seizure     There are no active problems to display for this patient.   Past Surgical History:  Procedure Laterality Date   CHOANAL ATRESIA REPAIR     NOSE SURGERY     SKIN GRAFT          Home Medications    Prior to Admission medications   Medication Sig Start Date End Date Taking? Authorizing Provider  acetaminophen (TYLENOL) 500 MG tablet Take 500-1,000 mg by mouth every 6 (six) hours as needed for moderate pain.     [provider]  albuterol (PROVENTIL HFA;VENTOLIN HFA) 108  (90 Base) MCG/ACT inhaler Inhale 1-2 puffs into the lungs every 6 (six) hours as needed for wheezing or shortness of breath. 04/28/16   Burgess Amor, PA-C  albuterol (PROVENTIL HFA;VENTOLIN HFA) 108 (90 Base) MCG/ACT inhaler Inhale 1-2 puffs into the lungs every 6 (six) hours as needed for wheezing or shortness of breath. 02/10/17   Deatra Canter, FNP  amitriptyline (ELAVIL) 25 MG tablet Take 1 tablet (25 mg total) by mouth at bedtime. 10/31/18   Melene Plan, DO  amoxicillin (AMOXIL) 500 MG capsule Take 1 capsule (500 mg total) by mouth 3 (three) times daily. 03/02/17   Ivery Quale, PA-C  amoxicillin (AMOXIL) 500 MG tablet Take 2 tablets (1,000 mg total) by mouth 2 (two) times daily. 04/10/18   Arby Barrette, MD  azithromycin (ZITHROMAX) 250 MG tablet Take 1 tablet (250 mg total) by mouth daily. Take first 2 tablets together, then 1 every day until finished. 02/10/17   Deatra Canter, FNP  chlordiazePOXIDE (LIBRIUM) 25 MG capsule 50mg  PO TID x 1D, then 25-50mg  PO BID X 1D, then 25-50mg  PO QD X 1D 10/31/18   Melene Plan, DO  fluticasone (FLONASE) 50 MCG/ACT nasal spray Place 1 spray into both nostrils daily. 02/10/17   Caccavale, Sophia, PA-C  guaiFENesin-codeine (CHERATUSSIN AC) 100-10 MG/5ML syrup Take 5 mLs by  mouth 3 (three) times daily as needed for cough. 02/10/17   Deatra Canter, FNP  haloperidol (HALDOL) 2 MG tablet Take 1 tablet (2 mg total) by mouth 3 (three) times daily. 09/13/18   Geoffery Lyons, MD  ibuprofen (ADVIL,MOTRIN) 600 MG tablet Take 1 tablet (600 mg total) by mouth 4 (four) times daily. 03/02/17   Ivery Quale, PA-C  ketorolac (TORADOL) 10 MG tablet Take 1 tablet (10 mg total) by mouth every 6 (six) hours as needed. Patient not taking: Reported on 11/22/2016 11/11/16   Maxwell Caul, PA-C  levETIRAcetam (KEPPRA) 500 MG tablet Take 1 tablet (500 mg total) by mouth 2 (two) times daily. 10/31/18   Melene Plan, DO  methylPREDNISolone (MEDROL DOSEPAK) 4 MG TBPK tablet Take  6-5-4-3-2-1 po qd 02/10/17   Deatra Canter, FNP  naproxen (NAPROSYN) 500 MG tablet Take 1 tablet (500 mg total) by mouth 2 (two) times daily. Patient not taking: Reported on 11/22/2016 11/11/16   Renne Crigler, PA-C  omeprazole (PRILOSEC) 20 MG capsule Take 1 capsule (20 mg total) by mouth daily. 04/10/18   Arby Barrette, MD  ondansetron (ZOFRAN ODT) 8 MG disintegrating tablet  ODT q4 hours prn nausea 09/13/18   Geoffery Lyons, MD  phenol (CHLORASEPTIC) 1.4 % LIQD Use as directed 1 spray in the mouth or throat as needed for throat irritation / pain. 02/10/17   Caccavale, Sophia, PA-C  promethazine-dextromethorphan (PROMETHAZINE-DM) 6.25-15 MG/5ML syrup Take 5 mLs by mouth 4 (four) times daily as needed for cough. 11/22/16   Triplett, Tammy, PA-C  traZODone (DESYREL) 100 MG tablet Take 100 mg by mouth at bedtime.    [provider]    Family History History reviewed. No pertinent family history.  Social History Social History   Tobacco Use   Smoking status: Current Every Day Smoker    Packs/day: 0.50    Types: Cigarettes   Smokeless tobacco: Never Used  Substance Use Topics   Alcohol use: No   Drug use: Yes    Types: Benzodiazepines, Marijuana    Comment: not currently per pt report 09/13/18     Allergies   Tessalon [benzonatate]   Review of Systems Review of Systems  Constitutional: Negative for chills and fever.  HENT: Negative for congestion and facial swelling.   Eyes: Negative for discharge and visual disturbance.  Respiratory: Negative for shortness of breath.   Cardiovascular: Negative for chest pain and palpitations.  Gastrointestinal: Negative for abdominal pain, diarrhea and vomiting.  Musculoskeletal: Negative for arthralgias and myalgias.  Skin: Negative for color change and rash.  Neurological: Negative for tremors, syncope and headaches.  Psychiatric/Behavioral: Negative for confusion and dysphoric mood.     Physical Exam Updated Vital  Signs BP 131/83 (BP Location: Right Arm)    Pulse (!) 114    Temp 98.6 F (37 C) (Oral)    Resp 20    SpO2 100%   Physical Exam Vitals signs and nursing note reviewed.  Constitutional:      Appearance: He is well-developed.  HENT:     Head: Normocephalic and atraumatic.  Eyes:     Pupils: Pupils are equal, round, and reactive to light.  Neck:     Musculoskeletal: Normal range of motion and neck supple.     Vascular: No JVD.  Cardiovascular:     Rate and Rhythm: Regular rhythm. Tachycardia present.     Heart sounds: No murmur. No friction rub. No gallop.   Pulmonary:     Effort: No respiratory  distress.     Breath sounds: No wheezing.  Abdominal:     General: There is no distension.     Tenderness: There is no abdominal tenderness. There is no guarding or rebound.  Musculoskeletal: Normal range of motion.  Skin:    Coloration: Skin is not pale.     Findings: No rash.  Neurological:     Mental Status: He is alert and oriented to person, place, and time.  Psychiatric:        Behavior: Behavior normal.      ED Treatments / Results  Labs (all labs ordered are listed, but only abnormal results are displayed) Labs Reviewed - No data to display  EKG None  Radiology No results found.  Procedures Procedures (including critical care time)  Medications Ordered in ED Medications  chlordiazePOXIDE (LIBRIUM) capsule 50 mg (has no administration in time range)  levETIRAcetam (KEPPRA) tablet 500 mg (has no administration in time range)  amitriptyline (ELAVIL) tablet 25 mg (has no administration in time range)     Initial Impression / Assessment and Plan / ED Course  I have reviewed the triage vital signs and the nursing notes.  Pertinent labs & imaging results that were available during my care of the patient were reviewed by me and considered in my medical decision making (see chart for details).        23 yo M with a chief complaint of requesting medication  refills.  He feels bad like he may have a seizure.  Patient is tachycardic otherwise his vital signs are unremarkable.  I discussed doing a laboratory evaluation which the patient is declining.  He states that typically he is tachycardic when he goes to the doctor because he feels nervous.  This may also be some component of benzodiazepine withdrawal.  I will give him a Librium taper.  Have him follow-up as an outpatient.  2:12 PM:  I have discussed the diagnosis/risks/treatment options with the patient and believe the pt to be eligible for discharge home to follow-up with PCP. We also discussed returning to the ED immediately if new or worsening sx occur. We discussed the sx which are most concerning (e.g., sudden worsening pain, fever, inability to tolerate by mouths) that necessitate immediate return. Medications administered to the patient during their visit and any new prescriptions provided to the patient are listed below.  Medications given during this visit Medications  chlordiazePOXIDE (LIBRIUM) capsule 50 mg (has no administration in time range)  levETIRAcetam (KEPPRA) tablet 500 mg (has no administration in time range)  amitriptyline (ELAVIL) tablet 25 mg (has no administration in time range)     The patient appears reasonably screen and/or stabilized for discharge and I doubt any other medical condition or other [facility] requiring further screening, evaluation, or treatment in the ED at this time prior to discharge.    Final Clinical Impressions(s) / ED Diagnoses   Final diagnoses:  Encounter for medication refill    ED Discharge Orders         Ordered    chlordiazePOXIDE (LIBRIUM) 25 MG capsule     10/31/18 1406    levETIRAcetam (KEPPRA) 500 MG tablet  2 times daily     10/31/18 1406    amitriptyline (ELAVIL) 25 MG tablet  Daily at bedtime     10/31/18 1406           Melene PlanFloyd, Yoseline Andersson, DO 10/31/18 1412

## 2018-10-31 NOTE — ED Triage Notes (Addendum)
Patient states he lives with his dad in [place] and left 2 days ago with girlfriend.   Patient is currently living in motel in [place] for past 2 days.    Recently patient had a seizure and was started on keppra and amitriptyline. Patient left medications in [place]. Patient has been off medications for 48 hours.   Patient states he feels he may have another seizure if he does not get medication.   Patient states all he needs is another prescription for medications.

## 2018-10-31 NOTE — ED Notes (Signed)
Bed: [facility]2 Expected date:  Expected time:  Means of arrival:  Comments: 

## 2018-12-16 ENCOUNTER — Emergency Department (HOSPITAL_COMMUNITY): Payer: Medicaid Other

## 2018-12-16 ENCOUNTER — Encounter (HOSPITAL_COMMUNITY): Payer: Self-pay

## 2018-12-16 ENCOUNTER — Emergency Department (HOSPITAL_COMMUNITY)
Admission: EM | Admit: 2018-12-16 | Discharge: 2018-12-16 | Disposition: A | Discharge status: Home / Self Care | Payer: Medicaid Other | Attending: Emergency Medicine | Admitting: Emergency Medicine

## 2018-12-16 ENCOUNTER — Other Ambulatory Visit: Payer: Self-pay

## 2018-12-16 DIAGNOSIS — Z888 Allergy status to other drugs, medicaments and biological substances status: Secondary | ICD-10-CM | POA: Insufficient documentation

## 2018-12-16 DIAGNOSIS — F17210 Nicotine dependence, cigarettes, uncomplicated: Secondary | ICD-10-CM | POA: Diagnosis not present

## 2018-12-16 DIAGNOSIS — Z9114 Patient's other noncompliance with medication regimen: Secondary | ICD-10-CM | POA: Diagnosis not present

## 2018-12-16 DIAGNOSIS — G40909 Epilepsy, unspecified, not intractable, without status epilepticus: Secondary | ICD-10-CM | POA: Insufficient documentation

## 2018-12-16 DIAGNOSIS — Z79891 Long term (current) use of opiate analgesic: Secondary | ICD-10-CM | POA: Insufficient documentation

## 2018-12-16 DIAGNOSIS — Z79899 Other long term (current) drug therapy: Secondary | ICD-10-CM | POA: Insufficient documentation

## 2018-12-16 DIAGNOSIS — R569 Unspecified convulsions: Secondary | ICD-10-CM

## 2018-12-16 LAB — BASIC METABOLIC PANEL
Anion gap: 13 (ref 5–15)
BUN: 18 mg/dL (ref 6–20)
CO2: 22 mmol/L (ref 22–32)
Calcium: 9.7 mg/dL (ref 8.9–10.3)
Chloride: 101 mmol/L (ref 98–111)
Creatinine, Ser: 1.26 mg/dL — ABNORMAL HIGH (ref 0.61–1.24)
GFR calc Af Amer: >60 mL/min (ref >60)
GFR calc non Af Amer: >60 mL/min (ref >60)
Glucose, Bld: 85 mg/dL (ref 70–99)
Potassium: 4.3 mmol/L (ref 3.5–5.1)
Sodium: 136 mmol/L (ref 135–145)

## 2018-12-16 LAB — CBC WITH DIFFERENTIAL/PLATELET
Abs Immature Granulocytes: 0.07 10*3/uL (ref 0.00–0.07)
Basophils Absolute: 0 10*3/uL (ref 0.0–0.1)
Basophils Relative: 0 %
Eosinophils Absolute: 0 10*3/uL (ref 0.0–0.5)
Eosinophils Relative: 0 %
HCT: 35.9 % — ABNORMAL LOW (ref 39.0–52.0)
Hemoglobin: 10.5 g/dL — ABNORMAL LOW (ref 13.0–17.0)
Immature Granulocytes: 1 %
Lymphocytes Relative: 9 %
Lymphs Abs: 1 10*3/uL (ref 0.7–4.0)
MCH: 22.3 pg — ABNORMAL LOW (ref 26.0–34.0)
MCHC: 29.2 g/dL — ABNORMAL LOW (ref 30.0–36.0)
MCV: 76.2 fL — ABNORMAL LOW (ref 80.0–100.0)
Monocytes Absolute: 0.7 10*3/uL (ref 0.1–1.0)
Monocytes Relative: 6 %
Neutro Abs: 9.8 10*3/uL — ABNORMAL HIGH (ref 1.7–7.7)
Neutrophils Relative %: 84 %
Platelets: 333 10*3/uL (ref 150–400)
RBC: 4.71 MIL/uL (ref 4.22–5.81)
RDW: 16 % — ABNORMAL HIGH (ref 11.5–15.5)
WBC: 11.7 10*3/uL — ABNORMAL HIGH (ref 4.0–10.5)
nRBC: 0 % (ref 0.0–0.2)

## 2018-12-16 MED ORDER — LEVETIRACETAM IN NACL 500 MG/100ML IV SOLN
500.0000 mg | Freq: Once | INTRAVENOUS | Status: DC
  Filled 2018-12-16: qty 100

## 2018-12-16 MED ORDER — PREGABALIN 75 MG PO CAPS
150.0000 mg | ORAL_CAPSULE | Freq: Once | ORAL | Status: AC
  Administered 2018-12-16: 150 mg via ORAL
  Filled 2018-12-16: qty 2

## 2018-12-16 NOTE — ED Provider Notes (Signed)
[facility] Provider Note   CSN: [id number] Arrival date & time: 12/16/18  1101    History   Chief Complaint Chief Complaint  Patient presents with  . Seizures    HPI Vincent Green is a 23 y.o. male with a history of seizure disorder, PTSD and chronic anxiety was at the methadone clinic this morning standing in line for his methadone dose when he had a seizure.  The seizure was witnessed by others standing in line, however it is unclear how long the patient seized.  He endorses not taking his Keppra for the past 2 days, stating "I thought I would be all right" not taking it, denies being out of his medication.  He is currently awake and alert, he denies fecal or urinary incontinence with today's episode, but this does occasionally occur with seizures.  He hit his head when he fell, also reports left knee pain with the fall.  He has had no treatment prior to arrival here.  He reports a prodromal symptom, stating he feels like he is in a dreamlike state prior to seizures, but states it does not last long enough for him to react and get into a safe position.  He does not currently have a PCP and does not recall who normally prescribes his Keppra medication.  Per the ED chart, his last Keppra dose was prescribed from the ED.     The history is provided by the patient.    Past Medical History:  Diagnosis Date  . Anxiety   . Anxiety   . Chronic dental pain   . PTSD (post-traumatic stress disorder)   . PTSD (post-traumatic stress disorder)   . Scoliosis   . Seizures ([place])    benzo-seizure     There are no active problems to display for this patient.   Past Surgical History:  Procedure Laterality Date  . CHOANAL ATRESIA REPAIR    . NOSE SURGERY    . SKIN GRAFT          Home Medications    Prior to Admission medications   Medication Sig Start Date End Date Taking? Authorizing Provider  acetaminophen (TYLENOL) 500 MG tablet Take 500-1,000 mg by mouth  every 6 (six) hours as needed for moderate pain.    Yes [provider]  albuterol (PROVENTIL HFA;VENTOLIN HFA) 108 (90 Base) MCG/ACT inhaler Inhale 1-2 puffs into the lungs every 6 (six) hours as needed for wheezing or shortness of breath. 02/10/17  Yes Lysbeth Penner, FNP  levETIRAcetam (KEPPRA) 500 MG tablet Take 1 tablet (500 mg total) by mouth 2 (two) times daily. 10/31/18  Yes Deno Etienne, DO  albuterol (PROVENTIL HFA;VENTOLIN HFA) 108 (90 Base) MCG/ACT inhaler Inhale 1-2 puffs into the lungs every 6 (six) hours as needed for wheezing or shortness of breath. 04/28/16   Evalee Jefferson, PA-C  amitriptyline (ELAVIL) 25 MG tablet Take 1 tablet (25 mg total) by mouth at bedtime. 10/31/18   Deno Etienne, DO  amoxicillin (AMOXIL) 500 MG capsule Take 1 capsule (500 mg total) by mouth 3 (three) times daily. Patient not taking: Reported on 12/16/2018 03/02/17   Lily Kocher, PA-C  amoxicillin (AMOXIL) 500 MG tablet Take 2 tablets (1,000 mg total) by mouth 2 (two) times daily. 04/10/18   Charlesetta Shanks, MD  azithromycin (ZITHROMAX) 250 MG tablet Take 1 tablet (250 mg total) by mouth daily. Take first 2 tablets together, then 1 every day until finished. Patient not taking: Reported on 12/16/2018 02/10/17  Deatra Canterxford, William J, FNP  chlordiazePOXIDE (LIBRIUM) 25 MG capsule 50mg  PO TID x 1D, then 25-50mg  PO BID X 1D, then 25-50mg  PO QD X 1D 10/31/18   Melene PlanFloyd, Dan, DO  fluticasone (FLONASE) 50 MCG/ACT nasal spray Place 1 spray into both nostrils daily. 02/10/17   Caccavale, Sophia, PA-C  guaiFENesin-codeine (CHERATUSSIN AC) 100-10 MG/5ML syrup Take 5 mLs by mouth 3 (three) times daily as needed for cough. 02/10/17   Deatra Canterxford, William J, FNP  haloperidol (HALDOL) 2 MG tablet Take 1 tablet (2 mg total) by mouth 3 (three) times daily. 09/13/18   Geoffery Lyonselo, Douglas, MD  ibuprofen (ADVIL,MOTRIN) 600 MG tablet Take 1 tablet (600 mg total) by mouth 4 (four) times daily. 03/02/17   Ivery QualeBryant, Hobson, PA-C  ketorolac (TORADOL) 10 MG  tablet Take 1 tablet (10 mg total) by mouth every 6 (six) hours as needed. Patient not taking: Reported on 11/22/2016 11/11/16   Maxwell CaulLayden, Lindsey A, PA-C  methylPREDNISolone (MEDROL DOSEPAK) 4 MG TBPK tablet Take 6-5-4-3-2-1 po qd Patient not taking: Reported on 12/16/2018 02/10/17   Deatra Canterxford, William J, FNP  naproxen (NAPROSYN) 500 MG tablet Take 1 tablet (500 mg total) by mouth 2 (two) times daily. Patient not taking: Reported on 11/22/2016 11/11/16   Renne CriglerGeiple, Joshua, PA-C  omeprazole (PRILOSEC) 20 MG capsule Take 1 capsule (20 mg total) by mouth daily. 04/10/18   Arby BarrettePfeiffer, Marcy, MD  ondansetron (ZOFRAN ODT) 8 MG disintegrating tablet 8mg  ODT q4 hours prn nausea 09/13/18   Geoffery Lyonselo, Douglas, MD  phenol (CHLORASEPTIC) 1.4 % LIQD Use as directed 1 spray in the mouth or throat as needed for throat irritation / pain. 02/10/17   Caccavale, Sophia, PA-C  promethazine-dextromethorphan (PROMETHAZINE-DM) 6.25-15 MG/5ML syrup Take 5 mLs by mouth 4 (four) times daily as needed for cough. 11/22/16   Triplett, Tammy, PA-C  traZODone (DESYREL) 100 MG tablet Take 100 mg by mouth at bedtime.    [provider]    Family History No family history on file.  Social History Social History   Tobacco Use  . Smoking status: Current Every Day Smoker    Packs/day: 0.50    Types: Cigarettes  . Smokeless tobacco: Never Used  Substance Use Topics  . Alcohol use: No  . Drug use: Not Currently    Types: Benzodiazepines, Marijuana     Allergies   Tessalon [benzonatate]   Review of Systems Review of Systems  Constitutional: Negative for fever.  HENT: Negative for congestion and sore throat.   Eyes: Negative.   Respiratory: Negative for chest tightness and shortness of breath.   Cardiovascular: Negative for chest pain.  Gastrointestinal: Negative for abdominal pain and nausea.  Genitourinary: Negative.  Negative for enuresis.  Musculoskeletal: Positive for arthralgias. Negative for joint swelling and neck pain.   Skin: Negative.  Negative for rash and wound.  Neurological: Positive for seizures and headaches. Negative for dizziness, weakness, light-headedness and numbness.  Psychiatric/Behavioral: Negative.      Physical Exam Updated Vital Signs BP 117/78   Pulse 100   Temp 98.3 F (36.8 C) (Oral)   Resp 16   Ht 6' (1.829 m)   Wt 72.6 kg   SpO2 99%   BMI 21.70 kg/m   Physical Exam Vitals signs and nursing note reviewed.  Constitutional:      Appearance: He is well-developed.     Comments: Awake,  Alert x person and place.  Not aware of date, day, time.    HENT:     Head: Normocephalic and  atraumatic.     Right Ear: Tympanic membrane normal.     Left Ear: Tympanic membrane normal.     Mouth/Throat:     Mouth: Mucous membranes are moist.  Eyes:     Extraocular Movements: Extraocular movements intact.     Conjunctiva/sclera: Conjunctivae normal.     Pupils: Pupils are equal, round, and reactive to light.  Neck:     Musculoskeletal: Normal range of motion.  Cardiovascular:     Rate and Rhythm: Normal rate and regular rhythm.     Heart sounds: Normal heart sounds.  Pulmonary:     Effort: Pulmonary effort is normal.     Breath sounds: Normal breath sounds. No wheezing.  Abdominal:     General: Bowel sounds are normal.     Palpations: Abdomen is soft.     Tenderness: There is no abdominal tenderness.  Musculoskeletal: Normal range of motion.        General: Tenderness present.     Left knee: He exhibits normal range of motion, no swelling, no effusion and no erythema. Tenderness found.     Comments: ttp left anterior patella, no palpable deformity, no visible trauma.  Skin:    General: Skin is warm and dry.  Neurological:     General: No focal deficit present.     Mental Status: He is alert.     Cranial Nerves: No cranial nerve deficit.     Sensory: No sensory deficit.     Motor: No weakness.      ED Treatments / Results  Labs (all labs ordered are listed, but only  abnormal results are displayed) Labs Reviewed  BASIC METABOLIC PANEL - Abnormal; Notable for the following components:      Result Value   Creatinine, Ser 1.26 (*)    All other components within normal limits  CBC WITH DIFFERENTIAL/PLATELET - Abnormal; Notable for the following components:   WBC 11.7 (*)    Hemoglobin 10.5 (*)    HCT 35.9 (*)    MCV 76.2 (*)    MCH 22.3 (*)    MCHC 29.2 (*)    RDW 16.0 (*)    Neutro Abs 9.8 (*)    All other components within normal limits    EKG EKG Interpretation  Date/Time:  Monday December 16 2018 11:09:52 EDT Ventricular Rate:  122 PR Interval:    QRS Duration: 111 QT Interval:  329 QTC Calculation: 469 R Axis:   84 Text Interpretation:  Sinus tachycardia Consider right ventricular hypertrophy Abnormal inferior Q waves ST elev, probable normal early repol pattern Borderline prolonged QT interval no significant change since Mar 2020 Confirmed by Pricilla LovelessGoldston, Scott [phone]([id number]) on 12/16/2018 11:14:40 AM   Radiology Ct Head Wo Contrast  Result Date: 12/16/2018 CLINICAL DATA:  Posttraumatic headache after seizure. EXAM: CT HEAD WITHOUT CONTRAST TECHNIQUE: Contiguous axial images were obtained from the base of the skull through the vertex without intravenous contrast. COMPARISON:  CT scan of October 10, 2018. FINDINGS: Brain: No evidence of acute infarction, hemorrhage, hydrocephalus, extra-axial collection or mass lesion/mass effect. Vascular: No hyperdense vessel or unexpected calcification. Skull: Normal. Negative for fracture or focal lesion. Sinuses/Orbits: No acute finding. Other: None. IMPRESSION: Normal head CT. Electronically Signed   By: Lupita RaiderJames  Green Jr M.D.   On: 12/16/2018 12:37   Dg Knee Complete 4 Views Left  Result Date: 12/16/2018 CLINICAL DATA:  Left knee pain after fall EXAM: LEFT KNEE - COMPLETE 4+ VIEW COMPARISON:  None. FINDINGS: No evidence of fracture,  dislocation, or joint effusion. No evidence of arthropathy or other focal bone  abnormality. Soft tissues are unremarkable. IMPRESSION: Negative. Electronically Signed   By: Lupita RaiderJames  Green Jr M.D.   On: 12/16/2018 12:21    Procedures Procedures (including critical care time)  Medications Ordered in ED Medications  levETIRAcetam (KEPPRA) IVPB 500 mg/100 mL premix (has no administration in time range)  pregabalin (LYRICA) capsule 150 mg (has no administration in time range)     Initial Impression / Assessment and Plan / ED Course  I have reviewed the triage vital signs and the nursing notes.  Pertinent labs & imaging results that were available during my care of the patient were reviewed by me and considered in my medical decision making (see chart for details).        No injuries sustained in todays fall. 1:21 PM Pt now more alert, reports he was taken off keppra and started on Lyrica 150 mg bid about 3 weeks ago, reporting it was LYRICA he had not taken in the past 2-3 days.  Added a PO dose of this prior to dc.  Pt more awake, ambulated in dept prior to dc home.  [facility]. Advised pt he needs recheck with Barbara CowerJason - he will call for appt.  Pt's grandmother will be picking pt up.   Final Clinical Impressions(s) / ED Diagnoses   Final diagnoses:  Seizure [facility](HCC)  Hx of medication noncompliance    ED Discharge Orders    None       Victoriano Laindol, Jamaree Hosier, PA-C 12/16/18 1322    Pricilla LovelessGoldston, Scott, MD 12/17/18 [phone]

## 2018-12-16 NOTE — ED Notes (Signed)
ED Provider at bedside. 

## 2018-12-16 NOTE — ED Notes (Signed)
Grandmother reports pt had a seizure 2 months ago and hit head and required staples.  Reports had 2 seizures prior to that.  Grandmother concerned  Pt not taking his medications correctly.  Grandmother phone number [phone]

## 2018-12-16 NOTE — ED Triage Notes (Signed)
Ems reports pt was at [place] had a witnessed seizure.  Pt says he takes keppra but didn't take it the past 2 days.   Pt says is on methadone.  Pt presently alert and oriented, diaphoretic.  Pt refuses IV.   C/O headache.  EMS says pt hit head when he had a seizure.

## 2018-12-16 NOTE — Discharge Instructions (Addendum)
It is very important that you are taking your seizure medicine as prescribed by your doctor to help avoid having seizures.  Call your doctor for a recheck of your symptoms this week and to reassess medications.

## 2018-12-16 NOTE — ED Notes (Signed)
Patient is resting comfortably. 

## 2018-12-16 NOTE — ED Notes (Addendum)
PT requesting to hold keppra at this time bc he's not sure if he takes the medication anymore. Pharmacy called and will call two pharmacies listed on his chart to verify his medications. Lab has already drawn blood and pt requesting no IV at this time. PT given a urinal to provide urine sample.

## 2019-03-04 ENCOUNTER — Other Ambulatory Visit: Payer: Self-pay

## 2019-03-04 ENCOUNTER — Encounter (HOSPITAL_COMMUNITY): Payer: Self-pay

## 2019-03-04 ENCOUNTER — Emergency Department (HOSPITAL_COMMUNITY): Payer: Medicaid Other

## 2019-03-04 ENCOUNTER — Emergency Department (HOSPITAL_COMMUNITY)
Admission: EM | Admit: 2019-03-04 | Discharge: 2019-03-04 | Disposition: A | Discharge status: Home / Self Care | Payer: Medicaid Other | Attending: Emergency Medicine | Admitting: Emergency Medicine

## 2019-03-04 DIAGNOSIS — R569 Unspecified convulsions: Secondary | ICD-10-CM | POA: Insufficient documentation

## 2019-03-04 DIAGNOSIS — F17210 Nicotine dependence, cigarettes, uncomplicated: Secondary | ICD-10-CM | POA: Diagnosis not present

## 2019-03-04 DIAGNOSIS — Y92512 Supermarket, store or market as the place of occurrence of the external cause: Secondary | ICD-10-CM | POA: Insufficient documentation

## 2019-03-04 DIAGNOSIS — Z79899 Other long term (current) drug therapy: Secondary | ICD-10-CM | POA: Diagnosis not present

## 2019-03-04 DIAGNOSIS — Y9389 Activity, other specified: Secondary | ICD-10-CM | POA: Diagnosis not present

## 2019-03-04 DIAGNOSIS — Y999 Unspecified external cause status: Secondary | ICD-10-CM | POA: Diagnosis not present

## 2019-03-04 DIAGNOSIS — S0003XA Contusion of scalp, initial encounter: Secondary | ICD-10-CM | POA: Insufficient documentation

## 2019-03-04 DIAGNOSIS — D649 Anemia, unspecified: Secondary | ICD-10-CM | POA: Insufficient documentation

## 2019-03-04 DIAGNOSIS — R000 Tachycardia, unspecified: Secondary | ICD-10-CM | POA: Diagnosis not present

## 2019-03-04 DIAGNOSIS — X58XXXA Exposure to other specified factors, initial encounter: Secondary | ICD-10-CM | POA: Insufficient documentation

## 2019-03-04 LAB — CBC WITH DIFFERENTIAL/PLATELET
Abs Immature Granulocytes: 0.11 10*3/uL — ABNORMAL HIGH (ref 0.00–0.07)
Basophils Absolute: 0 10*3/uL (ref 0.0–0.1)
Basophils Relative: 0 %
Eosinophils Absolute: 0.1 10*3/uL (ref 0.0–0.5)
Eosinophils Relative: 1 %
HCT: 28.9 % — ABNORMAL LOW (ref 39.0–52.0)
Hemoglobin: 8.3 g/dL — ABNORMAL LOW (ref 13.0–17.0)
Immature Granulocytes: 1 %
Lymphocytes Relative: 15 %
Lymphs Abs: 1.4 10*3/uL (ref 0.7–4.0)
MCH: 21 pg — ABNORMAL LOW (ref 26.0–34.0)
MCHC: 28.7 g/dL — ABNORMAL LOW (ref 30.0–36.0)
MCV: 73 fL — ABNORMAL LOW (ref 80.0–100.0)
Monocytes Absolute: 0.8 10*3/uL (ref 0.1–1.0)
Monocytes Relative: 9 %
Neutro Abs: 6.7 10*3/uL (ref 1.7–7.7)
Neutrophils Relative %: 74 %
Platelets: 251 10*3/uL (ref 150–400)
RBC: 3.96 MIL/uL — ABNORMAL LOW (ref 4.22–5.81)
RDW: 17.3 % — ABNORMAL HIGH (ref 11.5–15.5)
WBC: 9.1 10*3/uL (ref 4.0–10.5)
nRBC: 0 % (ref 0.0–0.2)

## 2019-03-04 LAB — COMPREHENSIVE METABOLIC PANEL
ALT: 27 U/L (ref 0–44)
AST: 26 U/L (ref 15–41)
Albumin: 3.5 g/dL (ref 3.5–5.0)
Alkaline Phosphatase: 120 U/L (ref 38–126)
Anion gap: 6 (ref 5–15)
BUN: 10 mg/dL (ref 6–20)
CO2: 25 mmol/L (ref 22–32)
Calcium: 8.9 mg/dL (ref 8.9–10.3)
Chloride: 106 mmol/L (ref 98–111)
Creatinine, Ser: 1.13 mg/dL (ref 0.61–1.24)
GFR calc Af Amer: >60 mL/min (ref >60)
GFR calc non Af Amer: >60 mL/min (ref >60)
Glucose, Bld: 95 mg/dL (ref 70–99)
Potassium: 4.8 mmol/L (ref 3.5–5.1)
Sodium: 137 mmol/L (ref 135–145)
Total Bilirubin: 0.3 mg/dL (ref 0.3–1.2)
Total Protein: 6.5 g/dL (ref 6.5–8.1)

## 2019-03-04 LAB — POC OCCULT BLOOD, ED: Fecal Occult Bld: NEGATIVE

## 2019-03-04 LAB — MAGNESIUM: Magnesium: 1.9 mg/dL (ref 1.7–2.4)

## 2019-03-04 LAB — CBG MONITORING, ED: Glucose-Capillary: 99 mg/dL (ref 70–99)

## 2019-03-04 MED ORDER — ALUM & MAG HYDROXIDE-SIMETH 200-200-20 MG/5ML PO SUSP
30.0000 mL | Freq: Once | ORAL | Status: DC
  Filled 2019-03-04: qty 30

## 2019-03-04 MED ORDER — PREGABALIN 75 MG PO CAPS
150.0000 mg | ORAL_CAPSULE | Freq: Once | ORAL | Status: AC
  Administered 2019-03-04: 150 mg via ORAL
  Filled 2019-03-04: qty 2

## 2019-03-04 MED ORDER — SODIUM CHLORIDE 0.9 % IV BOLUS
1000.0000 mL | Freq: Once | INTRAVENOUS | Status: AC
  Administered 2019-03-04: 1000 mL via INTRAVENOUS

## 2019-03-04 MED ORDER — LORAZEPAM 2 MG/ML IJ SOLN
1.0000 mg | Freq: Once | INTRAMUSCULAR | Status: AC
  Administered 2019-03-04: 1 mg via INTRAVENOUS
  Filled 2019-03-04: qty 1

## 2019-03-04 NOTE — Discharge Instructions (Addendum)
Your hemoglobin is 8, lower than before.    No blood in stool today.   Labs were normal. CT head was normal.  Follow up with neurology tomorrow for further discussion of your possible seizure episodes.  Return for chest pain shortness of breath vomiting blood or black, black stools  Take over the counter omeprazole 40 mg every morning on an empty stomach. Avoid alcohol, ibuprofen and aspirin products because these irritate stomach and can cause bleeding and ulcers

## 2019-03-04 NOTE — ED Triage Notes (Signed)
Pt brought to ED via [facility] for seizure in parking lot of [place] witnessed by dad and grandmother. Pt with history of seizures. Pt states he takes Lyrica for seizures but didn't have it this am. Pt refused IV and BS for EMS.

## 2019-03-04 NOTE — ED Notes (Signed)
Patient refuse medication.

## 2019-03-04 NOTE — ED Provider Notes (Addendum)
[facility]Y [facility] Provider Note   CSN: [id number] Arrival date & time: 03/04/19  1210     History   Chief Complaint Chief Complaint  Patient presents with  . Seizures    HPI Vincent Green is a 23 y.o. male with history of anxiety, PTSD, seizures on Lyrica [facility]  Patient states he had prodromal "feeling bad", lightheadedness and dizziness as he was walking into [employer].  He then remembers waking up on the ground.  His father witnessed seizure but he isn't here and patient doesn't know how long the seizure lasted. He only had one seizure.  Cannot remember last time he had a seizure. He reports posterior head injury and reports "worst headache of my life".  Other than headache he has no other complaints.  States he takes 150 mg Lyrica daily and he missed his dose this morning.  Used to be on Keppra but states his doctor discontinued this because it made him very nauseous.  He denies any recent illnesses.  He denies any prodromal chest pain, shortness of breath, palpitations.  He denies any tongue biting or injury, loss of bladder or bowel control during or after the seizure.  States sometimes he will urinate himself but this does not always happen with his seizures.  No interventions.  He declined IV and blood glucose by EMS.  He denies any frequent use of alcohol or benzodiazepine use.  No fever, neck pain or stiffness.  No vision changes or loss, difficulty with speech or swallowing, one-sided weakness or numbness after the fall.  He was ambulatory after the incident.  No other physical injuries from the fall.     HPI  Past Medical History:  Diagnosis Date  . Anxiety   . Anxiety   . Chronic dental pain   . PTSD (post-traumatic stress disorder)   . PTSD (post-traumatic stress disorder)   . Scoliosis   . Seizures (HCC)    benzo-seizure     There are no active problems to display for this patient.    Past Surgical History:  Procedure Laterality Date  . CHOANAL ATRESIA REPAIR    . NOSE SURGERY    . SKIN GRAFT          Home Medications    Prior to Admission medications   Medication Sig Start Date End Date Taking? Authorizing Provider  albuterol (PROVENTIL HFA;VENTOLIN HFA) 108 (90 Base) MCG/ACT inhaler Inhale 1-2 puffs into the lungs every 6 (six) hours as needed for wheezing or shortness of breath. 02/10/17  Yes Oxford, Anselm PancoastWilliam J, FNP  amitriptyline (ELAVIL) 25 MG tablet Take 1 tablet (25 mg total) by mouth at bedtime. 10/31/18  Yes Melene PlanFloyd, Dan, DO  diphenhydrAMINE (BENADRYL) 25 mg capsule Take 25 mg by mouth every 6 (six) hours as needed.   Yes [provider]  doxepin (SINEQUAN) 25 MG capsule Take 25 mg by mouth 3 (three) times daily. 10/01/18  Yes [provider]  pregabalin (LYRICA) 150 MG capsule Take 150 mg by mouth 2 (two) times daily.   Yes [provider]  pseudoephedrine (SUDAFED) 30 MG tablet Take 30 mg by mouth daily.   Yes [provider]  albuterol (PROVENTIL HFA;VENTOLIN HFA) 108 (90 Base) MCG/ACT inhaler Inhale 1-2 puffs into the lungs every 6 (six) hours as needed for wheezing or shortness of breath. Patient not taking: Reported on 03/04/2019 04/28/16   Burgess AmorIdol, Julie, PA-C  amoxicillin (AMOXIL) 500  MG capsule Take 1 capsule (500 mg total) by mouth 3 (three) times daily. Patient not taking: Reported on 12/16/2018 03/02/17   Lily Kocher, PA-C  amoxicillin (AMOXIL) 500 MG tablet Take 2 tablets (1,000 mg total) by mouth 2 (two) times daily. Patient not taking: Reported on 12/16/2018 04/10/18   Charlesetta Shanks, MD  azithromycin (ZITHROMAX) 250 MG tablet Take 1 tablet (250 mg total) by mouth daily. Take first 2 tablets together, then 1 every day until finished. Patient not taking: Reported on 12/16/2018 02/10/17   Lysbeth Penner, FNP  chlordiazePOXIDE (LIBRIUM) 25 MG capsule 50mg  PO TID x 1D, then 25-50mg  PO BID X 1D, then 25-50mg  PO QD X 1D  Patient not taking: Reported on 03/04/2019 10/31/18   Deno Etienne, DO  fluticasone [facility]) 50 MCG/ACT nasal spray Place 1 spray into both nostrils daily. Patient not taking: Reported on 03/04/2019 02/10/17   Caccavale, Sophia, PA-C  guaiFENesin-codeine (CHERATUSSIN AC) 100-10 MG/5ML syrup Take 5 mLs by mouth 3 (three) times daily as needed for cough. Patient not taking: Reported on 12/16/2018 02/10/17   Lysbeth Penner, FNP  haloperidol (HALDOL) 2 MG tablet Take 1 tablet (2 mg total) by mouth 3 (three) times daily. Patient not taking: Reported on 12/16/2018 09/13/18   Veryl Speak, MD  ibuprofen (ADVIL,MOTRIN) 600 MG tablet Take 1 tablet (600 mg total) by mouth 4 (four) times daily. Patient not taking: Reported on 12/16/2018 03/02/17   Lily Kocher, PA-C  ketorolac (TORADOL) 10 MG tablet Take 1 tablet (10 mg total) by mouth every 6 (six) hours as needed. Patient not taking: Reported on 11/22/2016 11/11/16   Volanda Napoleon, PA-C  levETIRAcetam (KEPPRA) 500 MG tablet Take 1 tablet (500 mg total) by mouth 2 (two) times daily. Patient not taking: Reported on 12/16/2018 10/31/18   Deno Etienne, DO  methylPREDNISolone (MEDROL DOSEPAK) 4 MG TBPK tablet Take 6-5-4-3-2-1 po qd Patient not taking: Reported on 12/16/2018 02/10/17   Lysbeth Penner, FNP  naproxen (NAPROSYN) 500 MG tablet Take 1 tablet (500 mg total) by mouth 2 (two) times daily. Patient not taking: Reported on 11/22/2016 11/11/16   Carlisle Cater, PA-C  omeprazole (PRILOSEC) 20 MG capsule Take 1 capsule (20 mg total) by mouth daily. Patient not taking: Reported on 12/16/2018 04/10/18   Charlesetta Shanks, MD  ondansetron (ZOFRAN ODT) 8 MG disintegrating tablet 8mg  ODT q4 hours prn nausea Patient not taking: Reported on 12/16/2018 09/13/18   Veryl Speak, MD  phenol (CHLORASEPTIC) 1.4 % LIQD Use as directed 1 spray in the mouth or throat as needed for throat irritation / pain. Patient not taking: Reported on 12/16/2018 02/10/17   Caccavale, Sophia, PA-C   promethazine-dextromethorphan (PROMETHAZINE-DM) 6.25-15 MG/5ML syrup Take 5 mLs by mouth 4 (four) times daily as needed for cough. Patient not taking: Reported on 12/16/2018 11/22/16   Kem Parkinson, PA-C    Family History No family history on file.  Social History Social History   Tobacco Use  . Smoking status: Current Every Day Smoker    Packs/day: 0.50    Types: Cigarettes  . Smokeless tobacco: Never Used  Substance Use Topics  . Alcohol use: No  . Drug use: Not Currently    Types: Benzodiazepines, Marijuana     Allergies   Tessalon [benzonatate]   Review of Systems Review of Systems  Skin: Positive for wound.  Neurological: Positive for seizures and headaches.  All other systems reviewed and are negative.    Physical Exam Updated Vital Signs BP 135/73 (BP  Location: Right Arm)   Pulse 98   Temp 97.9 F (36.6 C) (Oral)   Resp 15   Ht 6' (1.829 m)   Wt 81.6 kg   SpO2 99%   BMI 24.41 kg/m   Physical Exam Vitals signs and nursing note reviewed.  Constitutional:      General: He is not in acute distress.    Appearance: He is well-developed.     Comments: NAD.  HENT:     Head: Normocephalic. Abrasion and contusion present.     Comments: Contusion and abrasion to occipital/coronal aspect of scalp approx 4 x 5 cm, locally tender. No surrounding scalp crepitus, defect, tenderness. Facial bone without signs of trauma. No periorbital injury. Midface stable.     Right Ear: External ear normal.     Left Ear: External ear normal.     Ears:     Comments: No battle's sign    Nose: Nose normal.     Mouth/Throat:     Comments: Significant dental decay throughout with black decay at base of all teeth.  No intraoral or tongue injury. MMM Eyes:     General: No scleral icterus.    Conjunctiva/sclera: Conjunctivae normal.     Comments: No racoon's eyes   Neck:     Musculoskeletal: Normal range of motion and neck supple.     Comments: No cervical collar in place upon  arrival to the ER.  C-spine without midline or paraspinal muscle tenderness.  Full ROM without any pain.  No meningismus. Cardiovascular:     Rate and Rhythm: Regular rhythm. Tachycardia present.     Heart sounds: Normal heart sounds. No murmur.     Comments: HR 110-120s  Pulmonary:     Effort: Pulmonary effort is normal.     Breath sounds: Normal breath sounds.  Genitourinary:    Rectum: Guaiac result negative.     Comments:  RN present. No external fissures or external hemorrhoids noted. No induration or swelling of the perianal skin.   Stool color is brown with no gross blood noted.  DRE reveals good sphincter tone.   Musculoskeletal: Normal range of motion.        General: No deformity.  Skin:    General: Skin is warm and dry.     Capillary Refill: Capillary refill takes less than 2 seconds.     Comments: No diaphoresis   Neurological:     Mental Status: He is alert and oriented to person, place, and time.     Comments: No hand tremors  Alert and oriented to self, place, time and event.  Speech is fluent, clear without dysarthria or dysphasia. Strength 5/5 in upper/lower extremities    Sensation to light touch intact in face, upper/lower extremities. Sits on side of the bed without truncal sway No pronator drift. No leg drop. Normal finger to nose.   CN I not tested CN II grossly intact visual fields bilaterally. Unable to visualize posterior eye. CN III, IV, VI PEERL and EOMs intact bilaterally CN V light touch intact in all 3 divisions of trigeminal nerve CN VII facial movements symmetric CN VIII hearing grossly intact to voice/conversation CN IX, X no uvula deviation, symmetric rise of soft palate  CN XI 5/5 SCM and trapezius strength bilaterally  CN XII Midline tongue protrusion, symmetric L/R movements  Psychiatric:        Behavior: Behavior normal.        Thought Content: Thought content normal.  Judgment: Judgment normal.      ED Treatments / Results   Labs (all labs ordered are listed, but only abnormal results are displayed) Labs Reviewed  CBC WITH DIFFERENTIAL/PLATELET - Abnormal; Notable for the following components:      Result Value   RBC 3.96 (*)    Hemoglobin 8.3 (*)    HCT 28.9 (*)    MCV 73.0 (*)    MCH 21.0 (*)    MCHC 28.7 (*)    RDW 17.3 (*)    Abs Immature Granulocytes 0.11 (*)    All other components within normal limits  MAGNESIUM  COMPREHENSIVE METABOLIC PANEL  CBG MONITORING, ED  POC OCCULT BLOOD, ED    EKG EKG Interpretation  Date/Time:  Tuesday March 04 2019 12:34:22 EDT Ventricular Rate:  120 PR Interval:    QRS Duration: 117 QT Interval:  329 QTC Calculation: 465 R Axis:   54 Text Interpretation:  Sinus tachycardia Right bundle branch block Inferior infarct, old Anterolateral Q wave, probably normal for age since last tracing no significant change Confirmed by Eber Hong ([id number]) on 03/04/2019 12:56:49 PM   Radiology Ct Head Wo Contrast  Result Date: 03/04/2019 CLINICAL DATA:  Patient status post seizure today resulting in a fall and blow to the head. EXAM: CT HEAD WITHOUT CONTRAST TECHNIQUE: Contiguous axial images were obtained from the base of the skull through the vertex without intravenous contrast. COMPARISON:  Head CT scan 02/23/2019. FINDINGS: Brain: No evidence of acute infarction, hemorrhage, hydrocephalus, extra-axial collection or mass lesion/mass effect. Vascular: No hyperdense vessel or unexpected calcification. Skull: Normal. Negative for fracture or focal lesion. Sinuses/Orbits: Negative. Other: None. IMPRESSION: Normal head CT. Electronically Signed   By: Drusilla Kanner M.D.   On: 03/04/2019 13:57    Procedures Procedures (including critical care time)  Medications Ordered in ED Medications  pregabalin (LYRICA) capsule 150 mg (150 mg Oral Given 03/04/19 1239)  LORazepam (ATIVAN) injection 1 mg (1 mg Intravenous Given 03/04/19 1321)  sodium chloride 0.9 % bolus 1,000 mL (0  mLs Intravenous Stopped 03/04/19 1422)     Initial Impression / Assessment and Plan / ED Course  I have reviewed the triage vital signs and the nursing notes.  Pertinent labs & imaging results that were available during my care of the patient were reviewed by me and considered in my medical decision making (see chart for details).  Clinical Course as of Mar 03 2041  Tue Mar 04, 2019  1320 Last hgb 10.3 on 11/2018  Hemoglobin(!): 8.3 [CG]  1345 Sinus tachycardia Right bundle branch block Inferior infarct, old Anterolateral Q wave, probably normal for age since last tracing no significant change Confirmed by Eber Hong ([id number]) on 03/04/2019 12:56:49 PM  ED EKG [CG]  1503 Re-evaluated pt.  Discussed low hgb. States 1 week ago he vomited "black" 3 times.  No vomiting since. No abd pain. No melena. Father states a year ago he tested positive for H pylori and took only one antibiotic.   Pt asked for phenobarbital for seizures.  He has f/u with neurology tomorrow.  I declined this as this is not usually first line for seizure. He then asked for Ativan prescription. Again explained risks of prescribing this in ER, recommended discussion with neurology.    [CG]  1504 RN notified me she received an "anonymous" call from patient's brother who did not disclose his name. Brother states there is a "drug" issue with patient and father, allegedly they "share drugs" between each other.  Unclear what drugs these are   [CG]  1508 Pt unable to give urine    [CG]  1523 DRE with RN at bedside, negative for occult blood. Stool brown   [CG]  1523 Pt declined GI cocktail    [CG]    Clinical Course User Index [CG] Liberty HandyGibbons, Yarelis Ambrosino J, PA-C     I reviewed patient's emergent obtain pertinent PMH.  He first presented to the ER with seizure March 2020.  Obtained further information from father who is now at bedside.  States patient seizure started 3 to 4 months ago from taking too many doxepin for sleep.   Today he corroborates patient's story.  He reported feeling bad.  When walking he collapsed backwards and hitting the back of his head on the concrete.  He had generalized shaking of the body for about 45 seconds.  Father did not notice a postictal state such as confusion, bladder or bowel incontinence.  Patient was able to stand up without difficulty or assistance.  Patient takes 3 doxepin sterile every night for insomnia.  He last took this last night.  He has chronic insomnia and last night he woke up at 3 AM. He is requesting tylenol 3's for head pain.  Offered toradol and tylenol which he declined.   ER work up reviewed by me. Hgb 8.3 from 10-12 previously.  Otherwise normal.   Pt says 1 week ago he vomited black but denies recurrence, melena abd pain. Father states he was diagnosed with H pylori a year ago but only took 1 antibiotic.  Initially pt denies abd pain, acid reflux but when I walked out of the room he notified nurse he was having "severe" stomach pains and asked for pain medicines. He declined Gi cocktail.  Rectal exam negative for melena. HD stable.    Initial tachycardia resolved. Patient asked several times for narcotic pain medicines in ER, declining everything else offered to him including toradol, tylenol, GI cocktail.  At discharge he asked me for prescription for phenobarbital for seizures.  He has neurology f/u tomorrow and explained to him phenobarbital is not a medicine we use first line for seizure prevention.  He then asked for rx for ativan.  Recommended discussion with neurology tomorrow for formal seizure activity work up.  He has not had an EEG. There was no postictal states today per father report.  Recommended omeprazole daily and f/u with PCP and GI as well.  Concern for drug seeking behavior based on patient's requests today for tylenol 3s, phenbarbital and ativan.  RN received a phone call from patient's brother who stated patient and father "share drugs", unclear  about further details.    Final Clinical Impressions(s) / ED Diagnoses   Final diagnoses:  Seizure-like activity (HCC)  Contusion of scalp, initial encounter  Sinus tachycardia  Anemia, unspecified type    ED Discharge Orders    None         Jerrell MylarGibbons, Money Mckeithan J, PA-C 03/04/19 2043    Eber HongMiller, Brian, MD 03/13/19 [phone]

## 2019-03-07 ENCOUNTER — Other Ambulatory Visit (HOSPITAL_COMMUNITY): Payer: Self-pay | Admitting: Neurology

## 2019-03-07 ENCOUNTER — Other Ambulatory Visit: Payer: Self-pay | Admitting: Neurology

## 2019-03-07 DIAGNOSIS — G40309 Generalized idiopathic epilepsy and epileptic syndromes, not intractable, without status epilepticus: Secondary | ICD-10-CM

## 2019-03-12 ENCOUNTER — Ambulatory Visit (HOSPITAL_COMMUNITY): Admission: RE | Admit: 2019-03-12 | Payer: Medicaid Other | Source: Ambulatory Visit

## 2019-03-12 ENCOUNTER — Encounter (HOSPITAL_COMMUNITY): Payer: Self-pay

## 2019-03-13 ENCOUNTER — Telehealth (HOSPITAL_COMMUNITY): Payer: Self-pay

## 2019-03-13 ENCOUNTER — Emergency Department (HOSPITAL_COMMUNITY)
Admission: EM | Admit: 2019-03-13 | Discharge: 2019-03-13 | Disposition: A | Discharge status: Home / Self Care | Payer: Medicaid Other | Attending: Emergency Medicine | Admitting: Emergency Medicine

## 2019-03-13 ENCOUNTER — Other Ambulatory Visit: Payer: Self-pay

## 2019-03-13 ENCOUNTER — Encounter (HOSPITAL_COMMUNITY): Payer: Self-pay | Admitting: Emergency Medicine

## 2019-03-13 DIAGNOSIS — T40601A Poisoning by unspecified narcotics, accidental (unintentional), initial encounter: Secondary | ICD-10-CM | POA: Diagnosis present

## 2019-03-13 DIAGNOSIS — Z79899 Other long term (current) drug therapy: Secondary | ICD-10-CM | POA: Diagnosis not present

## 2019-03-13 DIAGNOSIS — F17210 Nicotine dependence, cigarettes, uncomplicated: Secondary | ICD-10-CM | POA: Insufficient documentation

## 2019-03-13 DIAGNOSIS — F1210 Cannabis abuse, uncomplicated: Secondary | ICD-10-CM | POA: Diagnosis not present

## 2019-03-13 MED ORDER — PROMETHAZINE HCL 12.5 MG PO TABS: 12.5000 mg | ORAL_TABLET | Freq: Four times a day (QID) | ORAL | 0 refills | Status: DC | PRN

## 2019-03-13 NOTE — Telephone Encounter (Signed)
Received referral for [facility] to be scheduled. I tried to call pt to schedule, no answer VM was full and unable to leave a message for a call back.

## 2019-03-13 NOTE — ED Provider Notes (Signed)
[facility] Provider Note   CSN: [id number] Arrival date & time: 03/13/19  1306     History   Chief Complaint Chief Complaint  Patient presents with  . Drug Overdose    HPI Vincent Green is a 23 y.o. male.     HPI  This patient is a 23 year old male presents after methadone overdose.  Accidentally given 260 mg of methadone instead of 130.  The patient otherwise feels like his normal self without any complaints.  This was approximately 5 hours ago that he took it.  He denies abdominal pain, chest pain, headache, cough, shortness of breath, visual changes, dizziness.  He was referred to the emergency department for evaluation of this overdose.  Past Medical History:  Diagnosis Date  . Anxiety   . Anxiety   . Chronic dental pain   . PTSD (post-traumatic stress disorder)   . PTSD (post-traumatic stress disorder)   . Scoliosis   . Seizures (HCC)    benzo-seizure     There are no active problems to display for this patient.   Past Surgical History:  Procedure Laterality Date  . CHOANAL ATRESIA REPAIR    . NOSE SURGERY    . SKIN GRAFT          Home Medications    Prior to Admission medications   Medication Sig Start Date End Date Taking? Authorizing Provider  albuterol (PROVENTIL HFA;VENTOLIN HFA) 108 (90 Base) MCG/ACT inhaler Inhale 1-2 puffs into the lungs every 6 (six) hours as needed for wheezing or shortness of breath. 02/10/17   Deatra Canterxford, William J, FNP  amitriptyline (ELAVIL) 25 MG tablet Take 1 tablet (25 mg total) by mouth at bedtime. 10/31/18   Melene PlanFloyd, Dan, DO  diphenhydrAMINE (BENADRYL) 25 mg capsule Take 25 mg by mouth every 6 (six) hours as needed.    [provider]  doxepin (SINEQUAN) 25 MG capsule Take 25 mg by mouth 3 (three) times daily. 10/01/18   [provider]  levETIRAcetam (KEPPRA) 500 MG tablet Take 1 tablet (500 mg total) by mouth 2 (two) times daily. Patient not taking: Reported on 12/16/2018 10/31/18    Melene PlanFloyd, Dan, DO  pregabalin (LYRICA) 150 MG capsule Take 150 mg by mouth 2 (two) times daily.    [provider]  promethazine (PHENERGAN) 12.5 MG tablet Take 1 tablet (12.5 mg total) by mouth every 6 (six) hours as needed for nausea or vomiting. 03/13/19   Eber HongMiller, Damonique Brunelle, MD  fluticasone [facility](FLONASE) 50 MCG/ACT nasal spray Place 1 spray into both nostrils daily. Patient not taking: Reported on 03/04/2019 02/10/17 03/13/19  Caccavale, Sophia, PA-C  haloperidol (HALDOL) 2 MG tablet Take 1 tablet (2 mg total) by mouth 3 (three) times daily. Patient not taking: Reported on 12/16/2018 09/13/18 03/13/19  Geoffery Lyonselo, Douglas, MD  omeprazole (PRILOSEC) 20 MG capsule Take 1 capsule (20 mg total) by mouth daily. Patient not taking: Reported on 12/16/2018 04/10/18 03/13/19  Arby BarrettePfeiffer, Marcy, MD    Family History No family history on file.  Social History Social History   Tobacco Use  . Smoking status: Current Every Day Smoker    Packs/day: 0.50    Types: Cigarettes  . Smokeless tobacco: Never Used  Substance Use Topics  . Alcohol use: No  . Drug use: Not Currently    Types: Benzodiazepines, Marijuana     Allergies   Tessalon [benzonatate]   Review of Systems Review of Systems  All other systems reviewed and are negative.    Physical Exam  Updated Vital Signs BP 124/73 (BP Location: Left Arm)   Pulse (!) 107   Temp 98.5 F (36.9 C) (Oral)   Resp 16   Ht 1.829 m (6')   Wt 81 kg   SpO2 95%   BMI 24.22 kg/m   Physical Exam Vitals signs and nursing note reviewed.  Constitutional:      General: He is not in acute distress.    Appearance: He is well-developed.  HENT:     Head: Normocephalic and atraumatic.     Comments: Poor dentition    Mouth/Throat:     Pharynx: No oropharyngeal exudate.  Eyes:     General: No scleral icterus.       Right eye: No discharge.        Left eye: No discharge.     Conjunctiva/sclera: Conjunctivae normal.     Pupils: Pupils are equal, round, and  reactive to light.  Neck:     Musculoskeletal: Normal range of motion and neck supple.     Thyroid: No thyromegaly.     Vascular: No JVD.  Cardiovascular:     Rate and Rhythm: Regular rhythm. Tachycardia present.     Heart sounds: Normal heart sounds. No murmur. No friction rub. No gallop.   Pulmonary:     Effort: Pulmonary effort is normal. No respiratory distress.     Breath sounds: Normal breath sounds. No wheezing or rales.  Abdominal:     General: Bowel sounds are normal. There is no distension.     Palpations: Abdomen is soft. There is no mass.     Tenderness: There is no abdominal tenderness.  Musculoskeletal: Normal range of motion.        General: No tenderness.  Lymphadenopathy:     Cervical: No cervical adenopathy.  Skin:    General: Skin is warm and dry.     Findings: No erythema or rash.  Neurological:     Mental Status: He is alert.     Coordination: Coordination normal.     Comments: Neurologic exam:  Speech clear, pupils equal round reactive to light, extraocular movements intact  Normal peripheral visual fields Cranial nerves III through XII normal including no facial droop Follows commands, moves all extremities x4, normal strength to bilateral upper and lower extremities at all major muscle groups including grip Sensation normal to light touch and pinprick Coordination intact, no limb ataxia, finger-nose-finger normal, heel shin normal bilaterally Rapid alternating movements normal No pronator drift Gait normal Can heal and toe walk without weakness.   Psychiatric:        Behavior: Behavior normal.      ED Treatments / Results  Labs (all labs ordered are listed, but only abnormal results are displayed) Labs Reviewed - No data to display  EKG  ED ECG REPORT  I personally interpreted this EKG   Date: 03/13/2019   Rate: 1133  Rhythm: sinus tachycardia  QRS Axis: normal  Intervals: normal  ST/T Wave abnormalities: normal  Conduction  Disutrbances:none  Narrative Interpretation: no QT or QRS prolongation  Old EKG Reviewed: none available  Radiology No results found.  Procedures Procedures (including critical care time)  Medications Ordered in ED Medications - No data to display   Initial Impression / Assessment and Plan / ED Course  I have reviewed the triage vital signs and the nursing notes.  Pertinent labs & imaging results that were available during my care of the patient were reviewed by me and considered in my medical decision  making (see chart for details).  Clinical Course as of Mar 12 1504  Thu Mar 13, 2019  1424 Care was discussed with the toxicologist from the [place] [facility] who is covering for the state at this time.  They report that the patient will need a couple hours of cardiac monitoring, EKG to monitor for QTC and QRS prolongation, they report that the maximum concentration in the bloodstream should now be at this point so given that he is ambulating, mentating and breathing without difficulty there is very little chance that this would have a respiratory effect.  They recommend that he does not take his dose tomorrow as he would have a therapeutic dose tomorrow morning based on the amount taken today.   [BM]    Clinical Course User Index [BM] Noemi Chapel, MD      D/w Poison control ekg to look for prolonged QT and arrhythmia Cardiac monitoring  The patient is willing to be observed in the emergency department as needed  Pulse improved, pt has maintained his airway and breathing and no hypoxia, no headache, no ataxia.  No vomiting, no numbness or weakness or visual changes.  Appears stable for d/c. - infomred not to take methadone tomorrow.  D/c hr is 107.  Final Clinical Impressions(s) / ED Diagnoses   Final diagnoses:  Opiate overdose, accidental or unintentional, initial encounter [facility])    ED Discharge Orders         Ordered    promethazine (PHENERGAN) 12.5 MG  tablet  Every 6 hours PRN     03/13/19 1505           Noemi Chapel, MD 03/13/19 1505

## 2019-03-13 NOTE — ED Triage Notes (Addendum)
Pt was accidentally given 260 mg of methadone instead of his usual dose of 130 mg today.  Pt also has staples in the back of his head from a fall.  Would like those removed.

## 2019-03-13 NOTE — Discharge Instructions (Signed)
Do not take your methadone tomorrow. Return for worsening symptoms Stay home today and drink plenty of fluids. ER for worsening symptoms. Do NOT take Zofran (Ondansetron) for nausea Promethazine as needed for nausea

## 2019-04-18 ENCOUNTER — Telehealth (HOSPITAL_COMMUNITY): Payer: Self-pay

## 2019-04-18 NOTE — Telephone Encounter (Signed)
Tried to call pt again to schedule EEG per referral request. No answer and VM was full. I let the referring office of Barton Fanny, NP know this.

## 2019-04-22 ENCOUNTER — Ambulatory Visit (HOSPITAL_COMMUNITY)
Admission: RE | Admit: 2019-04-22 | Discharge: 2019-04-22 | Disposition: A | Discharge status: Home / Self Care | Payer: Medicaid Other | Source: Ambulatory Visit | Attending: Neurology | Admitting: Neurology

## 2019-04-22 ENCOUNTER — Other Ambulatory Visit (HOSPITAL_COMMUNITY): Payer: Self-pay | Admitting: Neurology

## 2019-04-22 ENCOUNTER — Other Ambulatory Visit: Payer: Self-pay

## 2019-04-22 DIAGNOSIS — G40309 Generalized idiopathic epilepsy and epileptic syndromes, not intractable, without status epilepticus: Secondary | ICD-10-CM | POA: Diagnosis not present

## 2019-07-25 ENCOUNTER — Emergency Department (HOSPITAL_COMMUNITY)
Admission: EM | Admit: 2019-07-25 | Discharge: 2019-07-25 | Discharge status: Against Medical Advice | Payer: Medicaid Other | Attending: Emergency Medicine | Admitting: Emergency Medicine

## 2019-07-25 ENCOUNTER — Other Ambulatory Visit: Payer: Self-pay

## 2019-07-25 ENCOUNTER — Encounter (HOSPITAL_COMMUNITY): Payer: Self-pay

## 2019-07-25 DIAGNOSIS — K0889 Other specified disorders of teeth and supporting structures: Secondary | ICD-10-CM | POA: Diagnosis not present

## 2019-07-25 DIAGNOSIS — Z5320 Procedure and treatment not carried out because of patient's decision for unspecified reasons: Secondary | ICD-10-CM | POA: Insufficient documentation

## 2019-07-25 DIAGNOSIS — F17210 Nicotine dependence, cigarettes, uncomplicated: Secondary | ICD-10-CM | POA: Insufficient documentation

## 2019-07-25 DIAGNOSIS — Z79899 Other long term (current) drug therapy: Secondary | ICD-10-CM | POA: Insufficient documentation

## 2019-07-25 DIAGNOSIS — K5900 Constipation, unspecified: Secondary | ICD-10-CM | POA: Insufficient documentation

## 2019-07-25 DIAGNOSIS — G8929 Other chronic pain: Secondary | ICD-10-CM

## 2019-07-25 NOTE — ED Triage Notes (Signed)
Pt presents to ED for constipation x 9 days. Pt states no BM in 9 days. Pt states he has also had dental pain and his teeth breaking off. Pt states he is currently looking for a dentist.

## 2019-07-25 NOTE — ED Provider Notes (Cosign Needed)
[facility] EMERGENCY DEPARTMENT Provider Note   CSN: [id number] Arrival date & time: 07/25/19  1156     History Chief Complaint  Patient presents with  . Constipation  . Dental Pain    Vincent Green is a 24 y.o. male.  The history is provided by the patient. No language interpreter was used.  Constipation Severity:  Severe Timing:  Constant Progression:  Worsening Chronicity:  New Stool description:  None produced Worsened by:  Nothing Ineffective treatments:  Stool softeners Associated symptoms: abdominal pain   Risk factors: no recent antibiotic use   Dental Pain Pt reports he can not see the dentist for 2 weeks.        Past Medical History:  Diagnosis Date  . Anxiety   . Anxiety   . Chronic dental pain   . PTSD (post-traumatic stress disorder)   . PTSD (post-traumatic stress disorder)   . Scoliosis   . Seizures ([place])    benzo-seizure     There are no problems to display for this patient.   Past Surgical History:  Procedure Laterality Date  . CHOANAL ATRESIA REPAIR    . NOSE SURGERY    . SKIN GRAFT         No family history on file.  Social History   Tobacco Use  . Smoking status: Current Every Day Smoker    Packs/day: 0.50    Types: Cigarettes  . Smokeless tobacco: Never Used  Substance Use Topics  . Alcohol use: No  . Drug use: Not Currently    Types: Benzodiazepines, Marijuana    Home Medications Prior to Admission medications   Medication Sig Start Date End Date Taking? Authorizing Provider  albuterol (PROVENTIL HFA;VENTOLIN HFA) 108 (90 Base) MCG/ACT inhaler Inhale 1-2 puffs into the lungs every 6 (six) hours as needed for wheezing or shortness of breath. 02/10/17   Lysbeth Penner, FNP  amitriptyline (ELAVIL) 25 MG tablet Take 1 tablet (25 mg total) by mouth at bedtime. 10/31/18   Deno Etienne, DO  diphenhydrAMINE (BENADRYL) 25 mg capsule Take 25 mg by mouth every 6 (six) hours as needed.    [provider]  doxepin  (SINEQUAN) 25 MG capsule Take 25 mg by mouth 3 (three) times daily. 10/01/18   [provider]  levETIRAcetam (KEPPRA) 500 MG tablet Take 1 tablet (500 mg total) by mouth 2 (two) times daily. Patient not taking: Reported on 12/16/2018 10/31/18   Deno Etienne, DO  pregabalin (LYRICA) 150 MG capsule Take 150 mg by mouth 2 (two) times daily.    [provider]  promethazine (PHENERGAN) 12.5 MG tablet Take 1 tablet (12.5 mg total) by mouth every 6 (six) hours as needed for nausea or vomiting. 03/13/19   Noemi Chapel, MD  fluticasone [facility]) 50 MCG/ACT nasal spray Place 1 spray into both nostrils daily. Patient not taking: Reported on 03/04/2019 02/10/17 03/13/19  Caccavale, Sophia, PA-C  haloperidol (HALDOL) 2 MG tablet Take 1 tablet (2 mg total) by mouth 3 (three) times daily. Patient not taking: Reported on 12/16/2018 09/13/18 03/13/19  Veryl Speak, MD  omeprazole (PRILOSEC) 20 MG capsule Take 1 capsule (20 mg total) by mouth daily. Patient not taking: Reported on 12/16/2018 04/10/18 03/13/19  Charlesetta Shanks, MD    Allergies    Tessalon [benzonatate]  Review of Systems   Review of Systems  Gastrointestinal: Positive for abdominal pain and constipation.  All other systems reviewed and are negative.   Physical Exam Updated Vital Signs BP (!) 112/59 (  BP Location: Right Arm)   Pulse (!) 117   Temp 98.5 F (36.9 C) (Oral)   Resp 18   Ht 6' (1.829 m)   Wt 101.2 kg   SpO2 94%   BMI 30.24 kg/m   Physical Exam Vitals and nursing note reviewed.  Constitutional:      Appearance: He is well-developed.  HENT:     Head: Normocephalic and atraumatic.     Mouth/Throat:     Comments: Severe dental decay  Eyes:     Conjunctiva/sclera: Conjunctivae normal.  Cardiovascular:     Rate and Rhythm: Normal rate.     Heart sounds: No murmur.  Pulmonary:     Effort: Pulmonary effort is normal. No respiratory distress.     Breath sounds: Rhonchi present.  Abdominal:     Palpations:  Abdomen is soft.     Tenderness: There is abdominal tenderness.  Musculoskeletal:     Cervical back: Neck supple.  Skin:    General: Skin is warm and dry.  Neurological:     Mental Status: He is alert.     ED Results / Procedures / Treatments   Labs (all labs ordered are listed, but only abnormal results are displayed) Labs Reviewed - No data to display  EKG None  Radiology No results found.  Procedures Procedures (including critical care time)  Medications Ordered in ED Medications - No data to display  ED Course  I have reviewed the triage vital signs and the nursing notes.  Pertinent labs & imaging results that were available during my care of the patient were reviewed by me and considered in my medical decision making (see chart for details).    MDM Rules/Calculators/A&P                     Pt refused chest xray and covid test.  Pt left without prescriptions for antibiotic and laxative.   Pt requesting narcotics.  Final Clinical Impression(s) / ED Diagnoses Final diagnoses:  Constipation, unspecified constipation type  Chronic dental pain    Rx / DC Orders ED Discharge Orders    None       Elson Areas, [place] 07/25/19 1326

## 2019-07-29 ENCOUNTER — Encounter: Payer: Self-pay | Admitting: Gastroenterology

## 2019-08-12 ENCOUNTER — Telehealth: Payer: Self-pay | Admitting: *Deleted

## 2019-08-12 ENCOUNTER — Encounter: Payer: Self-pay | Admitting: Gastroenterology

## 2019-08-12 ENCOUNTER — Ambulatory Visit: Payer: Medicaid Other | Admitting: Gastroenterology

## 2019-08-12 ENCOUNTER — Other Ambulatory Visit: Payer: Self-pay

## 2019-08-12 ENCOUNTER — Ambulatory Visit (INDEPENDENT_AMBULATORY_CARE_PROVIDER_SITE_OTHER): Payer: Medicaid Other | Admitting: Gastroenterology

## 2019-08-12 VITALS — BP 114/68 | HR 107 | Temp 97.3°F | Ht 72.0 in | Wt 224.0 lb

## 2019-08-12 DIAGNOSIS — R1011 Right upper quadrant pain: Secondary | ICD-10-CM

## 2019-08-12 DIAGNOSIS — D509 Iron deficiency anemia, unspecified: Secondary | ICD-10-CM

## 2019-08-12 DIAGNOSIS — K219 Gastro-esophageal reflux disease without esophagitis: Secondary | ICD-10-CM | POA: Diagnosis not present

## 2019-08-12 DIAGNOSIS — K625 Hemorrhage of anus and rectum: Secondary | ICD-10-CM

## 2019-08-12 DIAGNOSIS — K5900 Constipation, unspecified: Secondary | ICD-10-CM | POA: Diagnosis not present

## 2019-08-12 MED ORDER — OMEPRAZOLE 20 MG PO CPDR: 20.0000 mg | DELAYED_RELEASE_CAPSULE | Freq: Two times a day (BID) | ORAL | 5 refills | Status: DC

## 2019-08-12 MED ORDER — LINACLOTIDE 290 MCG PO CAPS: 290.0000 ug | ORAL_CAPSULE | Freq: Every day | ORAL | 5 refills | Status: DC

## 2019-08-12 NOTE — Progress Notes (Deleted)
Primary Care Physician:  Leonie Douglas, MD  Primary Gastroenterologist:    No chief complaint on file.   HPI:  Vincent Green is a 24 y.o. male here at the request of Dr. Marlon Pel for constipation and nausea.  Patient seen in the ED earlier this month with complaints of dental pain and constipation.  Patient left without prescriptions for antibiotic laxative.  He was requesting narcotics.  No labs or x-rays obtained.  Current Outpatient Medications  Medication Sig Dispense Refill  . albuterol (PROVENTIL HFA;VENTOLIN HFA) 108 (90 Base) MCG/ACT inhaler Inhale 1-2 puffs into the lungs every 6 (six) hours as needed for wheezing or shortness of breath. 1 Inhaler 0  . amitriptyline (ELAVIL) 25 MG tablet Take 1 tablet (25 mg total) by mouth at bedtime. 15 tablet 0  . diphenhydrAMINE (BENADRYL) 25 mg capsule Take 25 mg by mouth every 6 (six) hours as needed.    . doxepin (SINEQUAN) 25 MG capsule Take 25 mg by mouth 3 (three) times daily.    Marland Kitchen levETIRAcetam (KEPPRA) 500 MG tablet Take 1 tablet (500 mg total) by mouth 2 (two) times daily. (Patient not taking: Reported on 12/16/2018) 60 tablet 0  . pregabalin (LYRICA) 150 MG capsule Take 150 mg by mouth 2 (two) times daily.    . promethazine (PHENERGAN) 12.5 MG tablet Take 1 tablet (12.5 mg total) by mouth every 6 (six) hours as needed for nausea or vomiting. 10 tablet 0   No current facility-administered medications for this visit.    Allergies as of 08/12/2019 - Review Complete 07/25/2019  Allergen Reaction Noted  . Tessalon [benzonatate] Swelling 04/28/2016    Past Medical History:  Diagnosis Date  . Anxiety   . Anxiety   . Chronic dental pain   . PTSD (post-traumatic stress disorder)   . PTSD (post-traumatic stress disorder)   . Scoliosis   . Seizures ([place])    benzo-seizure     Past Surgical History:  Procedure Laterality Date  . CHOANAL ATRESIA REPAIR    . NOSE SURGERY    . SKIN GRAFT      No family history on  file.  Social History   Socioeconomic History  . Marital status: Single    Spouse name: Not on file  . Number of children: Not on file  . Years of education: Not on file  . Highest education level: Not on file  Occupational History  . Not on file  Tobacco Use  . Smoking status: Current Every Day Smoker    Packs/day: 0.50    Types: Cigarettes  . Smokeless tobacco: Never Used  Substance and Sexual Activity  . Alcohol use: No  . Drug use: Not Currently    Types: Benzodiazepines, Marijuana  . Sexual activity: Not on file  Other Topics Concern  . Not on file  Social History Narrative  . Not on file   Social Determinants of Health   Financial Resource Strain:   . Difficulty of Paying Living Expenses: Not on file  Food Insecurity:   . Worried About Charity fundraiser in the Last Year: Not on file  . Ran Out of Food in the Last Year: Not on file  Transportation Needs:   . Lack of Transportation (Medical): Not on file  . Lack of Transportation (Non-Medical): Not on file  Physical Activity:   . Days of Exercise per Week: Not on file  . Minutes of Exercise per Session: Not on file  Stress:   . Feeling of Stress :  Not on file  Social Connections:   . Frequency of Communication with Friends and Family: Not on file  . Frequency of Social Gatherings with Friends and Family: Not on file  . Attends Religious Services: Not on file  . Active Member of Clubs or Organizations: Not on file  . Attends Banker Meetings: Not on file  . Marital Status: Not on file  Intimate Partner Violence:   . Fear of Current or Ex-Partner: Not on file  . Emotionally Abused: Not on file  . Physically Abused: Not on file  . Sexually Abused: Not on file      ROS:  General: Negative for anorexia, weight loss, fever, chills, fatigue, weakness. Eyes: Negative for vision changes.  ENT: Negative for hoarseness, difficulty swallowing , nasal congestion. CV: Negative for chest pain, angina,  palpitations, dyspnea on exertion, peripheral edema.  Respiratory: Negative for dyspnea at rest, dyspnea on exertion, cough, sputum, wheezing.  GI: See history of present illness. GU:  Negative for dysuria, hematuria, urinary incontinence, urinary frequency, nocturnal urination.  MS: Negative for joint pain, low back pain.  Derm: Negative for rash or itching.  Neuro: Negative for weakness, abnormal sensation, seizure, frequent headaches, memory loss, confusion.  Psych: Negative for anxiety, depression, suicidal ideation, hallucinations.  Endo: Negative for unusual weight change.  Heme: Negative for bruising or bleeding. Allergy: Negative for rash or hives.    Physical Examination:  There were no vitals taken for this visit.   General: Well-nourished, well-developed in no acute distress.  Head: Normocephalic, atraumatic.   Eyes: Conjunctiva pink, no icterus. Mouth: Oropharyngeal mucosa moist and pink , no lesions erythema or exudate. Neck: Supple without thyromegaly, masses, or lymphadenopathy.  Lungs: Clear to auscultation bilaterally.  Heart: Regular rate and rhythm, no murmurs rubs or gallops.  Abdomen: Bowel sounds are normal, nontender, nondistended, no hepatosplenomegaly or masses, no abdominal bruits or    hernia , no rebound or guarding.   Rectal: *** Extremities: No lower extremity edema. No clubbing or deformities.  Neuro: Alert and oriented x 4 , grossly normal neurologically.  Skin: Warm and dry, no rash or jaundice.   Psych: Alert and cooperative, normal mood and affect.  Labs: ***  Imaging Studies: No results found.

## 2019-08-12 NOTE — Progress Notes (Signed)
Primary Care Physician:  Leonie Douglas, MD  Primary Gastroenterologist:  Garfield Cornea, MD   Chief Complaint  Patient presents with  . Constipation  . Gastroesophageal Reflux    HPI:  Vincent Green is a 24 y.o. male here at the request of Dr. Marlon Pel for further evaluation of nausea, constipation.  Patient has a history of posttraumatic stress disorder, anxiety, seizures.  Patient was seen in the ED earlier this month for complaints of dental pain and constipation.  Plan is to prescribe antibiotic and laxative but patient left without prescriptions, refused Covid testing or chest x-ray (abnormal lung exam).  According to the ED record, he requested narcotics but none were given.  Patient presents with complaints of constipation.  Complains of constipation for couple of years.  Can go up to 6 days or more without a bowel movement.  When he has a bowel movement, passes large caliber long hard stool often associated with fresh blood.  Feels like rectal tear when it happens.  No melena.  Complains of GERD for the past 2 to 3 months.  Has tried Tums and Protonix without relief.  States he will wake up at night with vomiting.  Complains of dysphagia to almost everything he swallows for the past couple of months.  Also has right upper quadrant pain there all the time.  Sometimes worse than others.  Stabbing quality at times.  Sometimes better with bowel movement.  Reflux worse with meals.  Patient tries to use suppositories or enemas without relief of constipation.  No relief on MiraLAX.  Has never tried prescription medications for constipation.  Significant weight gain since June 2020.  Weight is up 65 pounds.  Patient had labs in September 2020.  LFTs, met 7 normal.  Hemoglobin 8.3, hematocrit 28.9, MCV 73.  In March 2020 his hemoglobin was 10.1.  In October 2019 hemoglobin 15.1.  Denies significant aspirin or NSAID use.   Current Outpatient Medications  Medication Sig Dispense Refill  .  albuterol (PROVENTIL HFA;VENTOLIN HFA) 108 (90 Base) MCG/ACT inhaler Inhale 1-2 puffs into the lungs every 6 (six) hours as needed for wheezing or shortness of breath. 1 Inhaler 0  . amitriptyline (ELAVIL) 25 MG tablet Take 1 tablet (25 mg total) by mouth at bedtime. 15 tablet 0  . DEPAKOTE 500 MG DR tablet Take 500 mg by mouth. Takes 1 in am and 2 in pm    . diphenhydrAMINE (BENADRYL) 25 mg capsule Take 25 mg by mouth every 6 (six) hours as needed.    . doxepin (SINEQUAN) 25 MG capsule Take 25 mg by mouth 3 (three) times daily.    . pregabalin (LYRICA) 150 MG capsule Take 150 mg by mouth 2 (two) times daily.    . promethazine (PHENERGAN) 12.5 MG tablet Take 1 tablet (12.5 mg total) by mouth every 6 (six) hours as needed for nausea or vomiting. 10 tablet 0   No current facility-administered medications for this visit.    Allergies as of 08/12/2019 - Review Complete 08/12/2019  Allergen Reaction Noted  . Tessalon [benzonatate] Swelling 04/28/2016    Past Medical History:  Diagnosis Date  . Anxiety   . Anxiety   . Chronic dental pain   . PTSD (post-traumatic stress disorder)   . PTSD (post-traumatic stress disorder)   . Scoliosis   . Seizures ([place])    benzo-seizure     Past Surgical History:  Procedure Laterality Date  . CHOANAL ATRESIA REPAIR    . NOSE SURGERY    .  SKIN GRAFT      Family History  Problem Relation Age of Onset  . Other Mother 52       04/2019 autopsy pending  . GER disease Paternal Grandmother   . Colon cancer Neg Hx     Social History   Socioeconomic History  . Marital status: Single    Spouse name: Not on file  . Number of children: Not on file  . Years of education: Not on file  . Highest education level: Not on file  Occupational History  . Not on file  Tobacco Use  . Smoking status: Current Every Day Smoker    Packs/day: 0.50    Types: Cigarettes  . Smokeless tobacco: Never Used  . Tobacco comment: smokes 6 cigarettes daily  Substance and  Sexual Activity  . Alcohol use: No  . Drug use: Not Currently    Types: Benzodiazepines, Marijuana  . Sexual activity: Not on file  Other Topics Concern  . Not on file  Social History Narrative  . Not on file   Social Determinants of Health   Financial Resource Strain:   . Difficulty of Paying Living Expenses: Not on file  Food Insecurity:   . Worried About Charity fundraiser in the Last Year: Not on file  . Ran Out of Food in the Last Year: Not on file  Transportation Needs:   . Lack of Transportation (Medical): Not on file  . Lack of Transportation (Non-Medical): Not on file  Physical Activity:   . Days of Exercise per Week: Not on file  . Minutes of Exercise per Session: Not on file  Stress:   . Feeling of Stress : Not on file  Social Connections:   . Frequency of Communication with Friends and Family: Not on file  . Frequency of Social Gatherings with Friends and Family: Not on file  . Attends Religious Services: Not on file  . Active Member of Clubs or Organizations: Not on file  . Attends Archivist Meetings: Not on file  . Marital Status: Not on file  Intimate Partner Violence:   . Fear of Current or Ex-Partner: Not on file  . Emotionally Abused: Not on file  . Physically Abused: Not on file  . Sexually Abused: Not on file      ROS:  General: Negative for anorexia, weight loss, fever, chills, fatigue, weakness. Eyes: Negative for vision changes.  ENT: Negative for hoarseness,  nasal congestion.  See HPI CV: Negative for chest pain, angina, palpitations, dyspnea on exertion, peripheral edema.  Respiratory: Negative for dyspnea at rest, dyspnea on exertion, cough, sputum, wheezing.  GI: See history of present illness. GU:  Negative for dysuria, hematuria, urinary incontinence, urinary frequency, nocturnal urination.  MS: Negative for joint pain, low back pain.  Derm: Negative for rash or itching.  Neuro: Negative for weakness, abnormal sensation,  seizure, frequent headaches, memory loss, confusion.  Psych: Negative for  , suicidal ideation, hallucinations.  Also for anxiety and depression.   Endo: Negative for unusual weight change.  Heme: Negative for bruising or bleeding. Allergy: Negative for rash or hives.    Physical Examination:  BP 114/68   Pulse (!) 107   Temp (!) 97.3 F (36.3 C) (Temporal)   Ht 6' (1.829 m)   Wt 224 lb (101.6 kg)   BMI 30.38 kg/m    General: Well-nourished, well-developed in no acute distress.  Appears somewhat older than stated age. Head: Normocephalic, atraumatic.   Eyes: Conjunctiva  pink, no icterus. Mouth: masked Neck: Supple without thyromegaly, masses, or lymphadenopathy.  Lungs: Clear to auscultation bilaterally.  Heart: Regular rate and rhythm, no murmurs rubs or gallops.  Abdomen: Bowel sounds are normal,   nondistended, no hepatosplenomegaly or masses, no abdominal bruits or    hernia , no rebound or guarding.  Mild ruq tenderness Rectal: not performed Extremities: No lower extremity edema. No clubbing or deformities.  Neuro: Alert and oriented x 4 , grossly normal neurologically.  Skin: Warm and dry, no rash or jaundice.   Psych: Alert and cooperative, normal mood and affect.  Labs: Lab Results  Component Value Date   CREATININE 1.13 03/04/2019   BUN 10 03/04/2019   NA 137 03/04/2019   K 4.8 03/04/2019   CL 106 03/04/2019   CO2 25 03/04/2019   Lab Results  Component Value Date   WBC 9.1 03/04/2019   HGB 8.3 (L) 03/04/2019   HCT 28.9 (L) 03/04/2019   MCV 73.0 (L) 03/04/2019   PLT 251 03/04/2019   Lab Results  Component Value Date   ALT 27 03/04/2019   AST 26 03/04/2019   ALKPHOS 120 03/04/2019   BILITOT 0.3 03/04/2019   No results found for: IRON, TIBC, FERRITIN   Imaging Studies: No results found.

## 2019-08-12 NOTE — Telephone Encounter (Signed)
Dentist office called to see if it safe for pt to have tooth extraction today.  Discussed with AB and advised them that she said it should be ok to proceed.

## 2019-08-12 NOTE — Patient Instructions (Signed)
1. Please go for labs today. 2. We will schedule you for colonoscopy and upper endoscopy as soon as we can. 3. Start omeprazole 20mg  twice daily before breakfast and evening meal for reflux. 4. Start linzess daily before breakfast for constipation. 5. Try to loose weight. Cut back on any soda, juices, fried/greasy foods. Cut back on sweets.    Constipation, Adult Constipation is when a person has fewer bowel movements in a week than normal, has difficulty having a bowel movement, or has stools that are dry, hard, or larger than normal. Constipation may be caused by an underlying condition. It may become worse with age if a person takes certain medicines and does not take in enough fluids. Follow these instructions at home: Eating and drinking   Eat foods that have a lot of fiber, such as fresh fruits and vegetables, whole grains, and beans.  Limit foods that are high in fat, low in fiber, or overly processed, such as french fries, hamburgers, cookies, candies, and soda.  Drink enough fluid to keep your urine clear or pale yellow. General instructions  Exercise regularly or as told by your health care provider.  Go to the restroom when you have the urge to go. Do not hold it in.  Take over-the-counter and prescription medicines only as told by your health care provider. These include any fiber supplements.  Practice pelvic floor retraining exercises, such as deep breathing while relaxing the lower abdomen and pelvic floor relaxation during bowel movements.  Watch your condition for any changes.  Keep all follow-up visits as told by your health care provider. This is important. Contact a health care provider if:  You have pain that gets worse.  You have a fever.  You do not have a bowel movement after 4 days.  You vomit.  You are not hungry.  You lose weight.  You are bleeding from the anus.  You have thin, pencil-like stools. Get help right away if:  You have a  fever and your symptoms suddenly get worse.  You leak stool or have blood in your stool.  Your abdomen is bloated.  You have severe pain in your abdomen.  You feel dizzy or you faint. This information is not intended to replace advice given to you by your health care provider. Make sure you discuss any questions you have with your health care provider. Document Revised: 05/18/2017 Document Reviewed: 11/24/2015 Elsevier Patient Education  2020 [employer]   Gastroesophageal Reflux Disease, Adult Gastroesophageal reflux (GER) happens when acid from the stomach flows up into the tube that connects the mouth and the stomach (esophagus). Normally, food travels down the esophagus and stays in the stomach to be digested. However, when a person has GER, food and stomach acid sometimes move back up into the esophagus. If this becomes a more serious problem, the person may be diagnosed with a disease called gastroesophageal reflux disease (GERD). GERD occurs when the reflux:  Happens often.  Causes frequent or severe symptoms.  Causes problems such as damage to the esophagus. When stomach acid comes in contact with the esophagus, the acid may cause soreness (inflammation) in the esophagus. Over time, GERD may create small holes (ulcers) in the lining of the esophagus. What are the causes? This condition is caused by a problem with the muscle between the esophagus and the stomach (lower esophageal sphincter, or LES). Normally, the LES muscle closes after food passes through the esophagus to the stomach. When the LES is weakened  or abnormal, it does not close properly, and that allows food and stomach acid to go back up into the esophagus. The LES can be weakened by certain dietary substances, medicines, and medical conditions, including:  Tobacco use.  Pregnancy.  Having a hiatal hernia.  Alcohol use.  Certain foods and beverages, such as coffee, chocolate, onions, and peppermint. What  increases the risk? You are more likely to develop this condition if you:  Have an increased body weight.  Have a connective tissue disorder.  Use NSAID medicines. What are the signs or symptoms? Symptoms of this condition include:  Heartburn.  Difficult or painful swallowing.  The feeling of having a lump in the throat.  Abitter taste in the mouth.  Bad breath.  Having a large amount of saliva.  Having an upset or bloated stomach.  Belching.  Chest pain. Different conditions can cause chest pain. Make sure you see your health care provider if you experience chest pain.  Shortness of breath or wheezing.  Ongoing (chronic) cough or a night-time cough.  Wearing away of tooth enamel.  Weight loss. How is this diagnosed? Your health care provider will take a medical history and perform a physical exam. To determine if you have mild or severe GERD, your health care provider may also monitor how you respond to treatment. You may also have tests, including:  A test to examine your stomach and esophagus with a small camera (endoscopy).  A test thatmeasures the acidity level in your esophagus.  A test thatmeasures how much pressure is on your esophagus.  A barium swallow or modified barium swallow test to show the shape, size, and functioning of your esophagus. How is this treated? The goal of treatment is to help relieve your symptoms and to prevent complications. Treatment for this condition may vary depending on how severe your symptoms are. Your health care provider may recommend:  Changes to your diet.  Medicine.  Surgery. Follow these instructions at home: Eating and drinking   Follow a diet as recommended by your health care provider. This may involve avoiding foods and drinks such as: ? Coffee and tea (with or without caffeine). ? Drinks that containalcohol. ? Energy drinks and sports drinks. ? Carbonated drinks or sodas. ? Chocolate and  cocoa. ? Peppermint and mint flavorings. ? Garlic and onions. ? Horseradish. ? Spicy and acidic foods, including peppers, chili powder, curry powder, vinegar, hot sauces, and barbecue sauce. ? Citrus fruit juices and citrus fruits, such as oranges, lemons, and limes. ? Tomato-based foods, such as red sauce, chili, salsa, and pizza with red sauce. ? Fried and fatty foods, such as donuts, french fries, potato chips, and high-fat dressings. ? High-fat meats, such as hot dogs and fatty cuts of red and white meats, such as rib eye steak, sausage, ham, and bacon. ? High-fat dairy items, such as whole milk, butter, and cream cheese.  Eat small, frequent meals instead of large meals.  Avoid drinking large amounts of liquid with your meals.  Avoid eating meals during the 2-3 hours before bedtime.  Avoid lying down right after you eat.  Do not exercise right after you eat. Lifestyle   Do not use any products that contain nicotine or tobacco, such as cigarettes, e-cigarettes, and chewing tobacco. If you need help quitting, ask your health care provider.  Try to reduce your stress by using methods such as yoga or meditation. If you need help reducing stress, ask your health care provider.  If you  are overweight, reduce your weight to an amount that is healthy for you. Ask your health care provider for guidance about a safe weight loss goal. General instructions  Pay attention to any changes in your symptoms.  Take over-the-counter and prescription medicines only as told by your health care provider. Do not take aspirin, ibuprofen, or other NSAIDs unless your health care provider told you to do so.  Wear loose-fitting clothing. Do not wear anything tight around your waist that causes pressure on your abdomen.  Raise (elevate) the head of your bed about 6 inches (15 cm).  Avoid bending over if this makes your symptoms worse.  Keep all follow-up visits as told by your health care provider.  This is important. Contact a health care provider if:  You have: ? New symptoms. ? Unexplained weight loss. ? Difficulty swallowing or it hurts to swallow. ? Wheezing or a persistent cough. ? A hoarse voice.  Your symptoms do not improve with treatment. Get help right away if you:  Have pain in your arms, neck, jaw, teeth, or back.  Feel sweaty, dizzy, or light-headed.  Have chest pain or shortness of breath.  Vomit and your vomit looks like blood or coffee grounds.  Faint.  Have stool that is bloody or black.  Cannot swallow, drink, or eat. Summary  Gastroesophageal reflux happens when acid from the stomach flows up into the esophagus. GERD is a disease in which the reflux happens often, causes frequent or severe symptoms, or causes problems such as damage to the esophagus.  Treatment for this condition may vary depending on how severe your symptoms are. Your health care provider may recommend diet and lifestyle changes, medicine, or surgery.  Contact a health care provider if you have new or worsening symptoms.  Take over-the-counter and prescription medicines only as told by your health care provider. Do not take aspirin, ibuprofen, or other NSAIDs unless your health care provider told you to do so.  Keep all follow-up visits as told by your health care provider. This is important. This information is not intended to replace advice given to you by your health care provider. Make sure you discuss any questions you have with your health care provider. Document Revised: 12/12/2017 Document Reviewed: 12/12/2017 Elsevier Patient Education  2020 [employer] for Gastroesophageal Reflux Disease, Adult When you have gastroesophageal reflux disease (GERD), the foods you eat and your eating habits are very important. Choosing the right foods can help ease the discomfort of GERD. Consider working with a diet and nutrition specialist (dietitian) to help you make  healthy food choices. What general guidelines should I follow?  Eating plan  Choose healthy foods low in fat, such as fruits, vegetables, whole grains, low-fat dairy products, and lean meat, fish, and poultry.  Eat frequent, small meals instead of three large meals each day. Eat your meals slowly, in a relaxed setting. Avoid bending over or lying down until 2-3 hours after eating.  Limit high-fat foods such as fatty meats or fried foods.  Limit your intake of oils, butter, and shortening to less than 8 teaspoons each day.  Avoid the following: ? Foods that cause symptoms. These may be different for different people. Keep a food diary to keep track of foods that cause symptoms. ? Alcohol. ? Drinking large amounts of liquid with meals. ? Eating meals during the 2-3 hours before bed.  Cook foods using methods other than frying. This may include baking, grilling, or broiling.  Lifestyle  Maintain a healthy weight. Ask your health care provider what weight is healthy for you. If you need to lose weight, work with your health care provider to do so safely.  Exercise for at least 30 minutes on 5 or more days each week, or as told by your health care provider.  Avoid wearing clothes that fit tightly around your waist and chest.  Do not use any products that contain nicotine or tobacco, such as cigarettes and e-cigarettes. If you need help quitting, ask your health care provider.  Sleep with the head of your bed raised. Use a wedge under the mattress or blocks under the bed frame to raise the head of the bed. What foods are not recommended? The items listed may not be a complete list. Talk with your dietitian about what dietary choices are best for you. Grains Pastries or quick breads with added fat. [place] toast. Vegetables Deep fried vegetables. [place] fries. Any vegetables prepared with added fat. Any vegetables that cause symptoms. For some people this may include tomatoes and tomato  products, chili peppers, onions and garlic, and horseradish. Fruits Any fruits prepared with added fat. Any fruits that cause symptoms. For some people this may include citrus fruits, such as oranges, grapefruit, pineapple, and lemons. Meats and other protein foods High-fat meats, such as fatty beef or pork, hot dogs, ribs, ham, sausage, salami and bacon. Fried meat or protein, including fried fish and fried chicken. Nuts and nut butters. Dairy Whole milk and chocolate milk. Sour cream. Cream. Ice cream. Cream cheese. Milk shakes. Beverages Coffee and tea, with or without caffeine. Carbonated beverages. Sodas. Energy drinks. Fruit juice made with acidic fruits (such as orange or grapefruit). Tomato juice. Alcoholic drinks. Fats and oils Butter. Margarine. Shortening. Ghee. Sweets and desserts Chocolate and cocoa. Donuts. Seasoning and other foods Pepper. Peppermint and spearmint. Any condiments, herbs, or seasonings that cause symptoms. For some people, this may include curry, hot sauce, or vinegar-based salad dressings. Summary  When you have gastroesophageal reflux disease (GERD), food and lifestyle choices are very important to help ease the discomfort of GERD.  Eat frequent, small meals instead of three large meals each day. Eat your meals slowly, in a relaxed setting. Avoid bending over or lying down until 2-3 hours after eating.  Limit high-fat foods such as fatty meat or fried foods. This information is not intended to replace advice given to you by your health care provider. Make sure you discuss any questions you have with your health care provider. Document Revised: 09/26/2018 Document Reviewed: 06/06/2016 Elsevier Patient Education  2020 [employer].

## 2019-08-14 ENCOUNTER — Encounter: Payer: Self-pay | Admitting: Gastroenterology

## 2019-08-14 NOTE — Progress Notes (Signed)
Cc'ed to pcp °

## 2019-08-14 NOTE — Assessment & Plan Note (Signed)
Intermittent rectal bleeding in the setting of constipation, likely benign anorectal source.  Would recommend colonoscopy for further evaluation.  Plan to schedule once received updated labs, address constipation.  Start Linzess 290 mcg daily.

## 2019-08-14 NOTE — Assessment & Plan Note (Addendum)
Frequent GERD, nocturnal reflux poorly controlled.  Worsened in the setting of significant weight gain.  Start omeprazole 20 mg twice daily before meals.  Reinforced antireflux measures.  Encouraged weight loss.  He also has esophageal dysphagia which may be due to reflux esophagitis but cannot rule out stricture.  We will move towards an EGD with esophageal dilation in the future, await labs prior to scheduling.  He will require deep sedation due to polypharmacy. I have discussed the risks, alternatives, benefits with regards to but not limited to the risk of reaction to medication, bleeding, infection, perforation and the patient is agreeable to proceed. Written consent to be obtained.

## 2019-08-14 NOTE — Assessment & Plan Note (Addendum)
Microcytic anemia, likely IDA.  Patient denies significant NSAID or aspirin use.  GI symptoms of refractory GERD, esophageal dysphagia, constipation and rectal bleeding.  Need to rule out celiac disease.  No need colonoscopy and upper endoscopy in the near future.  Await labs prior to scheduling.  Will need deep sedation due to polypharmacy.  I have discussed the risks, alternatives, benefits with regards to but not limited to the risk of reaction to medication, bleeding, infection, perforation and the patient is agreeable to proceed. Written consent to be obtained.

## 2019-08-25 ENCOUNTER — Ambulatory Visit: Payer: Medicaid Other | Admitting: Gastroenterology

## 2019-09-23 ENCOUNTER — Telehealth: Payer: Self-pay | Admitting: Gastroenterology

## 2019-09-23 NOTE — Telephone Encounter (Signed)
Tried calling pt. VM is full. Will call pt back.  

## 2019-09-23 NOTE — Telephone Encounter (Signed)
Please remind patient to have labs done so we can move forward with plans for TCS/EGD. i'm waiting on labs before scheduling.

## 2019-09-24 NOTE — Telephone Encounter (Signed)
Tried calling pt. VM is full. Letter with lab orders mailed to pt.

## 2019-10-14 ENCOUNTER — Other Ambulatory Visit: Payer: Self-pay

## 2019-10-14 ENCOUNTER — Ambulatory Visit
Admission: EM | Admit: 2019-10-14 | Discharge: 2019-10-14 | Disposition: A | Discharge status: Home / Self Care | Payer: Medicaid Other | Attending: Emergency Medicine | Admitting: Emergency Medicine

## 2019-10-14 DIAGNOSIS — K0889 Other specified disorders of teeth and supporting structures: Secondary | ICD-10-CM | POA: Diagnosis not present

## 2019-10-14 DIAGNOSIS — L304 Erythema intertrigo: Secondary | ICD-10-CM

## 2019-10-14 DIAGNOSIS — K047 Periapical abscess without sinus: Secondary | ICD-10-CM

## 2019-10-14 MED ORDER — MELOXICAM 15 MG PO TABS: 15.0000 mg | ORAL_TABLET | Freq: Every day | ORAL | 0 refills | Status: DC

## 2019-10-14 MED ORDER — CLOTRIMAZOLE 1 % EX CREA: TOPICAL_CREAM | CUTANEOUS | 0 refills | Status: DC

## 2019-10-14 MED ORDER — AMOXICILLIN-POT CLAVULANATE 875-125 MG PO TABS: 1.0000 | ORAL_TABLET | Freq: Two times a day (BID) | ORAL | 0 refills | Status: AC

## 2019-10-14 MED ORDER — CHLORHEXIDINE GLUCONATE 0.12 % MT SOLN: 15.0000 mL | Freq: Two times a day (BID) | OROMUCOSAL | 0 refills | Status: DC

## 2019-10-14 NOTE — Discharge Instructions (Signed)
Dental pain/ infection: Augmentin prescribed for dental infection.  Take as directed and to completion mobic and peridex for dental pain Recommend soft diet until evaluated by dentist Maintain oral hygiene care Follow up with dentist as soon as possible for further evaluation and treatment  Return or go to the ED if you have any new or worsening symptoms such as fever, chills, difficulty swallowing, painful swallowing, oral or neck swelling, nausea, vomiting, chest pain, SOB, etc...  Wound on abdomen: Daily cleansing of skin folds with a mild cleanser followed by drying of affected area with a soft cloth or hair dryer on a cool setting Airing of affected area when feasible Daily application of drying powders Use of absorbent material or clothing, such as cotton or merino wool, to separate skin in folds Avoid tight clothing that may cause friction Clotrimazole prescribed.  Use as instructed Follow up with PCP for recheck and to ensure your symptoms are improving Go to the ED if the patient has any new or worsening symptoms such as fever, chills, decreased appetite, decreased activity, drooling, vomiting, wheezing, rash, changes in bowel or bladder function, etc..Marland Kitchen

## 2019-10-14 NOTE — ED Triage Notes (Signed)
Pt reports has multiple broken teeth. Says went to the dentist 2 1/2 weeks ago and had a seizure while dentist was evaluating him.  Reports still having dental pain.  Also says has gained a lot of weight and his pants have rubbed a wound to lower abd

## 2019-10-14 NOTE — ED Provider Notes (Signed)
[place]   [id number] 10/14/19 Arrival Time: 5643  CC: DENTAL PAIN; wound check  SUBJECTIVE:  Vincent Green is a 24 y.o. male who presents with dental pain x 2.5 weeks.  Denies a precipitating event or trauma.  Reports having a seizure while having dental work done. Therefore was unable to complete dental work.  Localizes pain to bottom front to RT side of patient.  Has tried OTC analgesics without relief.  Worse with chewing.  Does not see a dentist regularly.  Reports similar symptoms in the past.  Denies fever, chills, dysphagia, odynophagia, oral or neck swelling, nausea, vomiting, chest pain, SOB.    Patient also mentions wound to lower abdomen x 6 days.  States pants having been rubbing on lower abdomen creating a wound.  Painful to the touch.  Has not tried OTC medications.    ROS: As per HPI.  All other pertinent ROS negative.     Past Medical History:  Diagnosis Date  . Anxiety   . Anxiety   . Chronic dental pain   . PTSD (post-traumatic stress disorder)   . PTSD (post-traumatic stress disorder)   . Scoliosis   . 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 CHOANAL ATRESIA REPAIR    . NOSE SURGERY    . SKIN GRAFT     Allergies  Allergen Reactions  . Tessalon [Benzonatate] Swelling    Throat swelling   No current facility-administered medications on file prior to encounter.   Current Outpatient Medications on File Prior to Encounter  Medication Sig Dispense Refill  . albuterol (PROVENTIL HFA;VENTOLIN HFA) 108 (90 Base) MCG/ACT inhaler Inhale 1-2 puffs into the lungs every 6 (six) hours as needed for wheezing or shortness of breath. 1 Inhaler 0  . amitriptyline (ELAVIL) 25 MG tablet Take 1 tablet (25 mg total) by mouth at bedtime. 15 tablet 0  . DEPAKOTE 500 MG DR tablet Take 500 mg by mouth. Takes 1 in am and 2 in pm    . doxepin (SINEQUAN) 25 MG capsule Take  25 mg by mouth 3 (three) times daily.    Marland Kitchen linaclotide (LINZESS) 290 MCG CAPS capsule Take 1 capsule (290 mcg total) by mouth daily before breakfast. 30 capsule 5  . pregabalin (LYRICA) 150 MG capsule Take 150 mg by mouth 2 (two) times daily.    Marland Kitchen omeprazole (PRILOSEC) 20 MG capsule Take 1 capsule (20 mg total) by mouth 2 (two) times daily before a meal. 60 capsule 5  . [DISCONTINUED] diphenhydrAMINE (BENADRYL) 25 mg capsule Take 25 mg by mouth every 6 (six) hours as needed.    . [DISCONTINUED] fluticasone (FLONASE) 50 MCG/ACT nasal spray Place 1 spray into both nostrils daily. (Patient not taking: Reported on 03/04/2019) 9.9 g 0  . [DISCONTINUED] haloperidol (HALDOL) 2 MG tablet Take 1 tablet (2 mg total) by mouth 3 (three) times daily. (Patient not taking: Reported on 12/16/2018) 40 tablet 0  . [DISCONTINUED] promethazine (PHENERGAN) 12.5 MG tablet Take 1 tablet (12.5 mg total) by mouth every 6 (six) hours as needed for nausea or vomiting. 10 tablet 0   Social History   Socioeconomic History  . Marital status: Single    Spouse name: Not on file  . Number of children: Not on file  . Years of education: Not on file  . Highest education level: Not on file  Occupational History  . Not on file  Tobacco Use  . Smoking status: Current Every Day Smoker    Packs/day: 0.50    Types: Cigarettes  . Smokeless tobacco: Never Used  . Tobacco comment: smokes 6 cigarettes daily  Substance and Sexual Activity  . Alcohol use: No  . Drug use: Not Currently    Types: Benzodiazepines, Marijuana  . Sexual activity: Not on file  Other Topics Concern  . Not on file  Social History Narrative  . Not on file   Social Determinants of Health   Financial Resource Strain:   . Difficulty of Paying Living Expenses:   Food Insecurity:   . Worried About Programme researcher, broadcasting/film/video in the Last Year:   . Barista in the Last Year:   Transportation Needs:   . Freight forwarder (Medical):   Marland Kitchen Lack of  Transportation (Non-Medical):   Physical Activity:   . Days of Exercise per Week:   . Minutes of Exercise per Session:   Stress:   . Feeling of Stress :   Social Connections:   . Frequency of Communication with Friends and Family:   . Frequency of Social Gatherings with Friends and Family:   . Attends Religious Services:   . Active Member of Clubs or Organizations:   . Attends Banker Meetings:   Marland Kitchen Marital Status:   Intimate Partner Violence:   . Fear of Current or Ex-Partner:   . Emotionally Abused:   Marland Kitchen Physically Abused:   . Sexually Abused:    Family History  Problem Relation Age of Onset  . Other Mother 44       04/2019 autopsy pending  . GER disease Paternal Grandmother   . Colon cancer Neg Hx     OBJECTIVE:  Vitals:   10/14/19 1313 10/14/19 1314  BP: 126/77   Pulse: (!) 123   Resp: 18   Temp: 98.9 F (37.2 C)   TempSrc: Oral   SpO2: 96%   Weight:  230 lb (104.3 kg)  Height:  6' (1.829 m)    General appearance: alert; no distress HENT: normocephalic; atraumatic; EACs clear, TMs pearly gray; nares patent; dentition: overall poor dentition, tartar buildup and dental caries evident over lower front to RT gums without areas of fluctuance; oropharynx clear Neck: supple without LAD Lungs: normal respirations; CTAB CV: RRR Skin: warm and dry; small area of maceration to mid lower abdomen with surrounding erythema, TTP, no obvious discharge or bleeding Psychological: alert and cooperative; normal mood and affect  ASSESSMENT & PLAN:  1. Pain, dental   2. Dental infection   3. Intertrigo     Meds ordered this encounter  Medications  . amoxicillin-clavulanate (AUGMENTIN) 875-125 MG tablet    Sig: Take 1 tablet by mouth every 12 (twelve) hours for 10 days.    Dispense:  20 tablet    Refill:  0    Order Specific Question:   Supervising Provider    Answer:   Eustace Moore [[id number]]  . meloxicam (MOBIC) 15 MG tablet    Sig: Take 1 tablet (15 mg  total) by mouth daily.    Dispense:  30 tablet    Refill:  0    Order Specific Question:   Supervising Provider    Answer:   Eustace Moore [[id number]]  . chlorhexidine (PERIDEX) 0.12 % solution    Sig: Use as directed 15 mLs in the mouth or throat 2 (two) times daily.    Dispense:  473 mL  Refill:  0    Order Specific Question:   Supervising Provider    Answer:   Eustace Moore [[id number]]  . clotrimazole (LOTRIMIN) 1 % cream    Sig: Apply to affected area 2 times daily    Dispense:  15 g    Refill:  0    Order Specific Question:   Supervising Provider    Answer:   Eustace Moore [[id number]]    @CSR @ Dental pain/ infection: Augmentin prescribed for dental infection.  Take as directed and to completion mobic and peridex for dental pain Recommend soft diet until evaluated by dentist Maintain oral hygiene care Follow up with dentist as soon as possible for further evaluation and treatment  Return or go to the ED if you have any new or worsening symptoms such as fever, chills, difficulty swallowing, painful swallowing, oral or neck swelling, nausea, vomiting, chest pain, SOB, etc...  Wound on abdomen: Daily cleansing of skin folds with a mild cleanser followed by drying of affected area with a soft cloth or hair dryer on a cool setting Airing of affected area when feasible Daily application of drying powders Use of absorbent material or clothing, such as cotton or merino wool, to separate skin in folds Avoid tight clothing that may cause friction Clotrimazole prescribed.  Use as instructed Follow up with PCP for recheck and to ensure your symptoms are improving Go to the ED if the patient has any new or worsening symptoms such as fever, chills, decreased appetite, decreased activity, drooling, vomiting, wheezing, rash, changes in bowel or bladder function, etc...  Reviewed expectations re: course of current medical issues. Questions answered. Outlined signs and symptoms  indicating need for more acute intervention. Patient verbalized understanding. After Visit Summary given.   , PA-C 10/14/19 1338

## 2020-10-25 ENCOUNTER — Encounter (HOSPITAL_COMMUNITY): Payer: Self-pay | Admitting: *Deleted

## 2020-10-25 ENCOUNTER — Other Ambulatory Visit: Payer: Self-pay

## 2020-10-25 ENCOUNTER — Emergency Department (HOSPITAL_COMMUNITY)
Admission: EM | Admit: 2020-10-25 | Discharge: 2020-10-25 | Disposition: A | Discharge status: Home / Self Care | Payer: Medicaid Other | Attending: Emergency Medicine | Admitting: Emergency Medicine

## 2020-10-25 DIAGNOSIS — Y900 Blood alcohol level of less than 20 mg/100 ml: Secondary | ICD-10-CM | POA: Diagnosis not present

## 2020-10-25 DIAGNOSIS — F17210 Nicotine dependence, cigarettes, uncomplicated: Secondary | ICD-10-CM | POA: Diagnosis not present

## 2020-10-25 DIAGNOSIS — K920 Hematemesis: Secondary | ICD-10-CM | POA: Diagnosis not present

## 2020-10-25 LAB — COMPREHENSIVE METABOLIC PANEL
ALT: 16 U/L (ref 0–44)
AST: 17 U/L (ref 15–41)
Albumin: 3.4 g/dL — ABNORMAL LOW (ref 3.5–5.0)
Alkaline Phosphatase: 69 U/L (ref 38–126)
Anion gap: 9 (ref 5–15)
BUN: 25 mg/dL — ABNORMAL HIGH (ref 6–20)
CO2: 31 mmol/L (ref 22–32)
Calcium: 9 mg/dL (ref 8.9–10.3)
Chloride: 96 mmol/L — ABNORMAL LOW (ref 98–111)
Creatinine, Ser: 1.23 mg/dL (ref 0.61–1.24)
GFR, Estimated: >60 mL/min (ref >60)
Glucose, Bld: 101 mg/dL — ABNORMAL HIGH (ref 70–99)
Potassium: 3.2 mmol/L — ABNORMAL LOW (ref 3.5–5.1)
Sodium: 136 mmol/L (ref 135–145)
Total Bilirubin: 0.6 mg/dL (ref 0.3–1.2)
Total Protein: 7 g/dL (ref 6.5–8.1)

## 2020-10-25 LAB — CBC WITH DIFFERENTIAL/PLATELET
Abs Immature Granulocytes: 0.05 10*3/uL (ref 0.00–0.07)
Basophils Absolute: 0 10*3/uL (ref 0.0–0.1)
Basophils Relative: 0 %
Eosinophils Absolute: 0 10*3/uL (ref 0.0–0.5)
Eosinophils Relative: 0 %
HCT: 29.4 % — ABNORMAL LOW (ref 39.0–52.0)
Hemoglobin: 8.7 g/dL — ABNORMAL LOW (ref 13.0–17.0)
Immature Granulocytes: 0 %
Lymphocytes Relative: 19 %
Lymphs Abs: 2.3 10*3/uL (ref 0.7–4.0)
MCH: 23.5 pg — ABNORMAL LOW (ref 26.0–34.0)
MCHC: 29.6 g/dL — ABNORMAL LOW (ref 30.0–36.0)
MCV: 79.5 fL — ABNORMAL LOW (ref 80.0–100.0)
Monocytes Absolute: 1.5 10*3/uL — ABNORMAL HIGH (ref 0.1–1.0)
Monocytes Relative: 12 %
Neutro Abs: 8.5 10*3/uL — ABNORMAL HIGH (ref 1.7–7.7)
Neutrophils Relative %: 69 %
Platelets: 229 10*3/uL (ref 150–400)
RBC: 3.7 MIL/uL — ABNORMAL LOW (ref 4.22–5.81)
RDW: 19.1 % — ABNORMAL HIGH (ref 11.5–15.5)
WBC: 12.4 10*3/uL — ABNORMAL HIGH (ref 4.0–10.5)
nRBC: 0 % (ref 0.0–0.2)

## 2020-10-25 LAB — ETHANOL: Alcohol, Ethyl (B): <10 mg/dL (ref <10)

## 2020-10-25 LAB — LIPASE, BLOOD: Lipase: 19 U/L (ref 11–51)

## 2020-10-25 MED ORDER — SODIUM CHLORIDE 0.9 % IV SOLN
8.0000 mg/h | INTRAVENOUS | Status: DC
  Administered 2020-10-25: 8 mg/h via INTRAVENOUS
  Filled 2020-10-25 (×3): qty 80

## 2020-10-25 MED ORDER — SUCRALFATE 1 G PO TABS: 1.0000 g | ORAL_TABLET | Freq: Three times a day (TID) | ORAL | 0 refills | Status: DC

## 2020-10-25 MED ORDER — SODIUM CHLORIDE 0.9 % IV BOLUS
1000.0000 mL | Freq: Once | INTRAVENOUS | Status: AC
  Administered 2020-10-25: 1000 mL via INTRAVENOUS

## 2020-10-25 MED ORDER — FAMOTIDINE 20 MG PO TABS
20.0000 mg | ORAL_TABLET | Freq: Two times a day (BID) | ORAL | 0 refills | Status: DC
Start: 2020-10-25 — End: 2021-05-04

## 2020-10-25 MED ORDER — SODIUM CHLORIDE 0.9 % IV SOLN
80.0000 mg | Freq: Once | INTRAVENOUS | Status: AC
  Administered 2020-10-25: 80 mg via INTRAVENOUS
  Filled 2020-10-25: qty 80

## 2020-10-25 MED ORDER — PANTOPRAZOLE SODIUM 40 MG IV SOLR: 40.0000 mg | Freq: Two times a day (BID) | INTRAVENOUS | Status: DC

## 2020-10-25 NOTE — ED Notes (Signed)
Pt asleep, IV medications infusing without complications. Father remains at bedside. No episodes of vomiting or diarrhea while in ER.

## 2020-10-25 NOTE — ED Notes (Signed)
Pt was drowsy when this nurse assessed for IV. Pt would wake to loud voice and eventually woke up more. States due to the ongoing n/v x2 days he missed his methadone clinic appointment this morning and has been sleepy. Pt asking for methadone at this time. Explained the MD places medication orders if they determine they are appropriate. Pt states he has been vomiting intermittently for two days, this is the first time he's sought medical treatment. Denies pmh of this before; states he noticed blood in multiple times and thinks he aspirated "three times." Once awake he is A&Ox3, RR even and nonlabored. Skin cool and dry. Father at bedside, helps care for him.

## 2020-10-25 NOTE — ED Provider Notes (Signed)
[facility] EMERGENCY DEPARTMENT Provider Note   CSN: [id number] Arrival date & time: 10/25/20  1538     History No chief complaint on file.   Vincent Green is a 25 y.o. male.  HPI -year-old male with history of anxiety, PTSD now presents with his father due to concern for hematemesis. Without obvious precipitant, about 3 days ago the patient began having episodes of vomiting with blood.  The most substantial amount of blood was visible on day 1, and though he has had additional episodes of emesis since that time he has had possible resolution of hematemesis.  No reported fever, pain, dyspnea, syncope.  No reported change in medication, diet, activity.  History is largely obtained by the patient's father as the patient is sleeping, awakens easily, denies pain.  He again falls asleep again quickly.    Past Medical History:  Diagnosis Date  . Anxiety   . Anxiety   . Chronic dental pain   . PTSD (post-traumatic stress disorder)   . PTSD (post-traumatic stress disorder)   . Scoliosis   . Seizures (HCC)    benzo-seizure. last seizure 01/2019 per pt (documented 08/12/19)    Patient Active Problem List   Diagnosis Date Noted  . GERD (gastroesophageal reflux disease) 08/12/2019  . Constipation 08/12/2019  . Rectal bleeding 08/12/2019  . Microcytic anemia 08/12/2019  . RUQ pain 08/12/2019    Past Surgical History:  Procedure Laterality Date  . CHOANAL ATRESIA REPAIR    . NOSE SURGERY    . SKIN GRAFT         Family History  Problem Relation Age of Onset  . Other Mother 29       04/2019 autopsy pending  . GER disease Paternal Grandmother   . Colon cancer Neg Hx     Social History   Tobacco Use  . Smoking status: Current Every Day Smoker    Packs/day: 0.50    Types: Cigarettes  . Smokeless tobacco: Never Used  . Tobacco comment: smokes 6 cigarettes daily  Vaping Use  . Vaping Use: Former  Substance Use Topics  . Alcohol use: No  . Drug use: Not Currently     Types: Benzodiazepines, Marijuana    Home Medications Prior to Admission medications   Medication Sig Start Date End Date Taking? Authorizing Provider  albuterol (PROVENTIL HFA;VENTOLIN HFA) 108 (90 Base) MCG/ACT inhaler Inhale 1-2 puffs into the lungs every 6 (six) hours as needed for wheezing or shortness of breath. 02/10/17  Yes Oxford, Anselm Pancoast, FNP  DEPAKOTE 500 MG DR tablet Take 500 mg by mouth. Takes 1 in am and 2 in pm 07/03/19  Yes [provider]  diazepam (VALIUM) 5 MG tablet Take 5 mg by mouth every 12 (twelve) hours as needed for anxiety or muscle spasms.   Yes [provider]  lisdexamfetamine (VYVANSE) 60 MG capsule Take 60 mg by mouth every morning.   Yes [provider]  pregabalin (LYRICA) 150 MG capsule Take 300 mg by mouth 2 (two) times daily.   Yes [provider]  tiZANidine (ZANAFLEX) 4 MG tablet Take 4 mg by mouth 2 (two) times daily as needed for muscle spasms.   Yes [provider]  zolpidem (AMBIEN) 10 MG tablet Take 10 mg by mouth at bedtime.   Yes [provider]  amitriptyline (ELAVIL) 25 MG tablet Take 1 tablet (25 mg total) by mouth at bedtime. Patient not taking: No sig reported 10/31/18   Melene Plan, DO  chlorhexidine (PERIDEX) 0.12 % solution Use as directed 15 mLs in the mouth or throat 2 (two) times daily. Patient not taking: No sig reported 10/14/19   Wurst, [place], PA-C  clotrimazole (LOTRIMIN) 1 % cream Apply to affected area 2 times daily Patient not taking: No sig reported 10/14/19   Wurst, [place], PA-C  cyclobenzaprine (FLEXERIL) 10 MG tablet cyclobenzaprine 10 mg tablet  Take 1 tablet twice a day by oral route. Patient not taking: No sig reported    [provider]  doxepin (SINEQUAN) 25 MG capsule Take 25 mg by mouth 3 (three) times daily. Patient not taking: No sig reported 10/01/18   [provider]  linaclotide Karlene Einstein) 290 MCG CAPS capsule Take 1 capsule (290 mcg total) by  mouth daily before breakfast. Patient not taking: No sig reported 08/12/19   Tiffany Kocher, PA-C  LORazepam (ATIVAN) 1 MG tablet lorazepam 1 mg tablet  Take 1 tablet twice a day by oral route as needed. Patient not taking: No sig reported    [provider]  meloxicam (MOBIC) 15 MG tablet Take 1 tablet (15 mg total) by mouth daily. Patient not taking: No sig reported 10/14/19   Wurst, [place], PA-C  omeprazole (PRILOSEC) 20 MG capsule Take 1 capsule (20 mg total) by mouth 2 (two) times daily before a meal. Patient not taking: No sig reported 08/12/19   Tiffany Kocher, PA-C  diphenhydrAMINE (BENADRYL) 25 mg capsule Take 25 mg by mouth every 6 (six) hours as needed.  10/14/19  [provider]  fluticasone (FLONASE) 50 MCG/ACT nasal spray Place 1 spray into both nostrils daily. Patient not taking: Reported on 03/04/2019 02/10/17 03/13/19  Caccavale, Sophia, PA-C  haloperidol (HALDOL) 2 MG tablet Take 1 tablet (2 mg total) by mouth 3 (three) times daily. Patient not taking: Reported on 12/16/2018 09/13/18 03/13/19  Geoffery Lyons, MD  promethazine (PHENERGAN) 12.5 MG tablet Take 1 tablet (12.5 mg total) by mouth every 6 (six) hours as needed for nausea or vomiting. 03/13/19 10/14/19  Eber Hong, MD    Allergies    Tessalon [benzonatate]  Review of Systems   Review of Systems  Unable to perform ROS: Psychiatric disorder    Physical Exam Updated Vital Signs BP 96/63   Pulse 84   Temp 99.2 F (37.3 C) (Oral)   Resp 12   SpO2 100%   Physical Exam Vitals and nursing note reviewed.  Constitutional:      General: He is not in acute distress.    Appearance: He is well-developed.     Comments: Sleeping thin young male awakens easily, answers questions briefly, falls asleep again quickly.  HENT:     Head: Normocephalic and atraumatic.  Eyes:     Conjunctiva/sclera: Conjunctivae normal.  Cardiovascular:     Rate and Rhythm: Normal rate and regular rhythm.  Pulmonary:      Effort: Pulmonary effort is normal. No respiratory distress.     Breath sounds: No stridor.  Abdominal:     General: There is no distension.     Tenderness: There is no abdominal tenderness. There is no guarding.  Skin:    General: Skin is warm and dry.  Neurological:     General: No focal deficit present.     Mental Status: He is oriented to person, place, and time.  Psychiatric:     Comments: Withdrawn, not forthcoming about history.     ED Results / Procedures / Treatments   Labs (all labs ordered are  listed, but only abnormal results are displayed) Labs Reviewed  COMPREHENSIVE METABOLIC PANEL - Abnormal; Notable for the following components:      Result Value   Potassium 3.2 (*)    Chloride 96 (*)    Glucose, Bld 101 (*)    BUN 25 (*)    Albumin 3.4 (*)    All other components within normal limits  CBC WITH DIFFERENTIAL/PLATELET - Abnormal; Notable for the following components:   WBC 12.4 (*)    RBC 3.70 (*)    Hemoglobin 8.7 (*)    HCT 29.4 (*)    MCV 79.5 (*)    MCH 23.5 (*)    MCHC 29.6 (*)    RDW 19.1 (*)    Neutro Abs 8.5 (*)    Monocytes Absolute 1.5 (*)    All other components within normal limits  ETHANOL  LIPASE, BLOOD    EKG None  Radiology No results found.  Procedures Procedures   Medications Ordered in ED Medications  pantoprazole (PROTONIX) 80 mg in sodium chloride 0.9 % 100 mL (0.8 mg/mL) infusion (8 mg/hr Intravenous New Bag/Given 10/25/20 1738)  pantoprazole (PROTONIX) injection 40 mg (has no administration in time range)  sodium chloride 0.9 % bolus 1,000 mL (1,000 mLs Intravenous New Bag/Given 10/25/20 1712)  pantoprazole (PROTONIX) 80 mg in sodium chloride 0.9 % 100 mL IVPB (0 mg Intravenous Stopped 10/25/20 1738)    ED Course  I have reviewed the triage vital signs and the nursing notes.  Pertinent labs & imaging results that were available during my care of the patient were reviewed by me and considered in my medical decision making  (see chart for details).     7:23 PM Patient awake and alert, much more so than on arrival.  He has received fluid resuscitation, Protonix he has had no episodes of vomiting, no hematemesis. No episodes of lightheadedness, no presyncope. Have reviewed labs, discussed them with the patient and his father.  Patient does have some anemia, consistent with study from 2 years ago.  With no ongoing hematemesis, no substantial hypotension, improved mentation patient is appropriate for outpatient GI follow-up with anticipated EGD.  Patient notes that he has a GI physician with whom he can follow-up.  Patient started on H2, Carafate, discharged to follow-up as an outpatient. MDM Rules/Calculators/A&P MDM Number of Diagnoses or Management Options Hematemesis with nausea: new, needed workup   Amount and/or Complexity of Data Reviewed Clinical lab tests: ordered and reviewed Tests in the medicine section of CPT: reviewed and ordered Decide to obtain previous medical records or to obtain history from someone other than the patient: yes Obtain history from someone other than the patient: yes Review and summarize past medical records: yes Independent visualization of images, tracings, or specimens: yes  Risk of Complications, Morbidity, and/or Mortality Presenting problems: high Diagnostic procedures: high Management options: high  Critical Care Total time providing critical care: < 30 minutes  Patient Progress Patient progress: stable  Final Clinical Impression(s) / ED Diagnoses Final diagnoses:  Hematemesis with nausea    Rx / DC Orders ED Discharge Orders         Ordered    famotidine (PEPCID) 20 MG tablet  2 times daily        10/25/20 1926    sucralfate (CARAFATE) 1 g tablet  3 times daily with meals & bedtime       Note to Pharmacy: Take for one week   10/25/20 1926  Gerhard MunchLockwood, Rayana Geurin, MD 10/25/20 Kristopher [id number]

## 2020-10-25 NOTE — ED Triage Notes (Signed)
Family member states he has been vomiting blood for 2 days, very lethargic in triage and does answer questions

## 2020-10-25 NOTE — Discharge Instructions (Signed)
As discussed, with your episodes of vomiting, and vomiting blood, it may take some time for you to improve.  Take all medication as directed, and do not hesitate to return here for concerning changes.  Otherwise follow-up with our gastroenterology colleagues.  If you are unable to locate the contact information for your physician, please use the provided information for Dr. Jena Gauss.

## 2021-02-05 ENCOUNTER — Emergency Department (HOSPITAL_COMMUNITY): Payer: Medicaid Other

## 2021-02-05 ENCOUNTER — Emergency Department (HOSPITAL_COMMUNITY)
Admission: EM | Admit: 2021-02-05 | Discharge: 2021-02-05 | Discharge status: Against Medical Advice | Payer: Medicaid Other | Attending: Emergency Medicine | Admitting: Emergency Medicine

## 2021-02-05 ENCOUNTER — Other Ambulatory Visit: Payer: Self-pay

## 2021-02-05 ENCOUNTER — Encounter (HOSPITAL_COMMUNITY): Payer: Self-pay

## 2021-02-05 DIAGNOSIS — F17210 Nicotine dependence, cigarettes, uncomplicated: Secondary | ICD-10-CM | POA: Insufficient documentation

## 2021-02-05 DIAGNOSIS — R000 Tachycardia, unspecified: Secondary | ICD-10-CM | POA: Insufficient documentation

## 2021-02-05 DIAGNOSIS — R2242 Localized swelling, mass and lump, left lower limb: Secondary | ICD-10-CM | POA: Diagnosis not present

## 2021-02-05 NOTE — Discharge Instructions (Addendum)
You have swelling of your left lower leg, I am concerned for possible DVT, I would like you to come back for an ultrasound for a rule ou.  I given contact info above please call the number above to schedule your appointment for tomorrow.  I also would like you to consider some further work-up as I am concerned for a possible septic emboli/endocarditis this can be life-threatening and I recommend that you have a thorough work-up.  Come back to the emergency department if you develop chest pain, shortness of breath, severe abdominal pain, uncontrolled nausea, vomiting, diarrhea.

## 2021-02-05 NOTE — ED Triage Notes (Signed)
Pt. Left foot began swelling about a week ago. Pt. States it hurts and the swelling goes up to the pts. Knee. Pt. States they did not hurt their foot. Pts. Foot is swollen with no redness present.

## 2021-02-05 NOTE — ED Notes (Signed)
Pt signed out AMA, explained to pt to return at 9am in the morning for ultrasound. Pt ambulatory to waiting room with father.

## 2021-02-05 NOTE — ED Provider Notes (Signed)
[facility] EMERGENCY DEPARTMENT Provider Note   CSN: [id number] Arrival date & time: 02/05/21  1625     History Chief Complaint  Patient presents with   Leg Swelling    Vincent Green is a 25 y.o. male.  HPI  Patient with significant medical history of anxiety, chronic dental pain, PTSD, seizures presents with chief complaint of left leg swelling.  Patient states he noticed this about a week ago and has progressing gotten worse.  He states that he is having pain in his foot and the swelling has increased.  He denies  recent trauma to the area, denies travels or surgery, currently not on hormone therapy, has no history of PEs or DVTs, no significant cardiac history.  He denies chest pain, shortness of breath, denies illicit drug use,  has no associated fevers or chills.  He is never had this in the past.  He has no other complaints at this time.  He does not endorse back pain, abdominal pain, nausea, vomiting, diarrhea, worsening joint pain.  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 per pt (documented 08/12/19)    Patient Active Problem List   Diagnosis Date Noted   GERD (gastroesophageal reflux disease) 08/12/2019   Constipation 08/12/2019   Rectal bleeding 08/12/2019   Microcytic anemia 08/12/2019   RUQ pain 08/12/2019    Past Surgical History:  Procedure Laterality Date   CHOANAL ATRESIA REPAIR     NOSE SURGERY     SKIN GRAFT         Family History  Problem Relation Age of Onset   Other Mother 43       04/2019 autopsy pending   GER disease Paternal Grandmother    Colon cancer Neg Hx     Social History   Tobacco Use   Smoking status: Every Day    Packs/day: 0.50    Types: Cigarettes   Smokeless tobacco: Never   Tobacco comments:    smokes 6 cigarettes daily   Vaping Use   Vaping Use: Former  Substance Use Topics   Alcohol use: No   Drug use: Not Currently    Types: Benzodiazepines, Marijuana    Home Medications Prior to Admission medications   Medication Sig Start Date End Date Taking? Authorizing Provider  albuterol (PROVENTIL HFA;VENTOLIN HFA) 108 (90 Base) MCG/ACT inhaler Inhale 1-2 puffs into the lungs every 6 (six) hours as needed for wheezing or shortness of breath. 02/10/17   Deatra Canter, FNP  amitriptyline (ELAVIL) 25 MG tablet Take 1 tablet (25 mg total) by mouth at bedtime. Patient not taking: No sig reported 10/31/18   Melene Plan, DO  chlorhexidine (PERIDEX) 0.12 % solution Use as directed 15 mLs in the mouth or throat 2 (two) times daily. Patient not taking: No sig reported 10/14/19   Wurst, [place], PA-C  clotrimazole (LOTRIMIN) 1 % cream Apply to affected area 2 times daily Patient not taking: No sig reported 10/14/19   Wurst, [place], PA-C  cyclobenzaprine (FLEXERIL) 10 MG tablet cyclobenzaprine 10 mg tablet  Take 1 tablet twice a day by oral route. Patient not taking: No sig reported    [provider]  DEPAKOTE 500 MG DR tablet Take 500 mg by mouth. Takes 1 in am and 2  in pm 07/03/19   [provider]  diazepam (VALIUM) 5 MG tablet Take 5 mg by mouth every 12 (twelve) hours as needed for anxiety or muscle spasms.    [provider]  doxepin (SINEQUAN) 25 MG capsule Take 25 mg by mouth 3 (three) times daily. Patient not taking: No sig reported 10/01/18   [provider]  famotidine (PEPCID) 20 MG tablet Take 1 tablet (20 mg total) by mouth 2 (two) times daily. 10/25/20   Gerhard Munch, MD  linaclotide [facility]) 290 MCG CAPS capsule Take 1 capsule (290 mcg total) by mouth daily before breakfast. Patient not taking: No sig reported 08/12/19   Tiffany Kocher, PA-C  lisdexamfetamine (VYVANSE) 60 MG capsule Take 60 mg by mouth every morning.    [provider]  LORazepam (ATIVAN) 1  MG tablet lorazepam 1 mg tablet  Take 1 tablet twice a day by oral route as needed. Patient not taking: No sig reported    [provider]  pregabalin (LYRICA) 150 MG capsule Take 300 mg by mouth 2 (two) times daily.    [provider]  sucralfate (CARAFATE) 1 g tablet Take 1 tablet (1 g total) by mouth 4 (four) times daily -  with meals and at bedtime. 10/25/20   Gerhard Munch, MD  tiZANidine (ZANAFLEX) 4 MG tablet Take 4 mg by mouth 2 (two) times daily as needed for muscle spasms.    [provider]  zolpidem (AMBIEN) 10 MG tablet Take 10 mg by mouth at bedtime.    [provider]  diphenhydrAMINE (BENADRYL) 25 mg capsule Take 25 mg by mouth every 6 (six) hours as needed.  10/14/19  [provider]  fluticasone (FLONASE) 50 MCG/ACT nasal spray Place 1 spray into both nostrils daily. Patient not taking: Reported on 03/04/2019 02/10/17 03/13/19  Caccavale, Sophia, PA-C  haloperidol (HALDOL) 2 MG tablet Take 1 tablet (2 mg total) by mouth 3 (three) times daily. Patient not taking: Reported on 12/16/2018 09/13/18 03/13/19  Geoffery Lyons, MD  omeprazole (PRILOSEC) 20 MG capsule Take 1 capsule (20 mg total) by mouth 2 (two) times daily before a meal. Patient not taking: No sig reported 08/12/19 10/25/20  Tiffany Kocher, PA-C  promethazine (PHENERGAN) 12.5 MG tablet Take 1 tablet (12.5 mg total) by mouth every 6 (six) hours as needed for nausea or vomiting. 03/13/19 10/14/19  Eber Hong, MD    Allergies    Tessalon [benzonatate]  Review of Systems   Review of Systems  Constitutional:  Negative for chills and fever.  HENT:  Negative for congestion.   Respiratory:  Negative for shortness of breath.   Cardiovascular:  Positive for leg swelling. Negative for chest pain and palpitations.  Gastrointestinal:  Negative for abdominal pain.  Genitourinary:  Negative for enuresis.  Musculoskeletal:  Negative for back pain.  Skin:  Negative for rash and wound.   Neurological:  Negative for dizziness.  Hematological:  Does not bruise/bleed easily.   Physical Exam Updated Vital Signs BP 98/81 (BP Location: Right Arm)   Pulse (!) 115   Temp 98.2 F (36.8 C) (Oral)   Resp 18   Ht 6' (1.829 m)   Wt 95.3 kg   SpO2 100%   BMI 28.48 kg/m   Physical Exam Vitals and nursing note reviewed.  Constitutional:      General: He is not in acute distress.    Appearance: He is not ill-appearing.  HENT:     Head: Normocephalic and atraumatic.  Nose: No congestion.     Mouth/Throat:     Mouth: Mucous membranes are moist.     Pharynx: Oropharynx is clear. No oropharyngeal exudate or posterior oropharyngeal erythema.     Comments: Oropharynx is visualized patient has all teeth removed.  No signs of infection present. Eyes:     Conjunctiva/sclera: Conjunctivae normal.  Cardiovascular:     Rate and Rhythm: Regular rhythm. Tachycardia present.     Pulses: Normal pulses.     Heart sounds: Murmur heard.    No friction rub. No gallop.     Comments: Patient has a noted systolic murmur Pulmonary:     Effort: No respiratory distress.     Breath sounds: No wheezing, rhonchi or rales.  Abdominal:     Palpations: Abdomen is soft.     Tenderness: There is no abdominal tenderness.  Musculoskeletal:     Right lower leg: Edema present.     Left lower leg: Edema present.     Comments: Patient's lower extremities were visualized he has noted edema bilaterally worse on the left versus the right, 1+ on the right versus 2+ on the left going to the shins, no skin changes, no ulceration present, neurovascular fully intact.  Patient has full range of motion in his toes ankle and knee, area is nontender to palpation.  Calf was nontender to palpation, no palpable cords present.  Skin:    General: Skin is warm and dry.     Comments: No noted track marks present my exam no erythematous joints noted.  Neurological:     Mental Status: He is alert.  Psychiatric:         Mood and Affect: Mood normal.    ED Results / Procedures / Treatments   Labs (all labs ordered are listed, but only abnormal results are displayed) Labs Reviewed  CBC WITH DIFFERENTIAL/PLATELET    EKG None  Radiology DG Ankle Complete Left  Result Date: 02/05/2021 CLINICAL DATA:  Diffuse swelling. Left lower extremity swelling and pain from the foot to the knee for 6 days. EXAM: LEFT FOOT - COMPLETE 3+ VIEW; LEFT ANKLE COMPLETE - 3+ VIEW COMPARISON:  None. FINDINGS: Three views of the left foot and three views of the left ankle are obtained. There is prominent dorsal soft tissue swelling over the left foot. No radiopaque foreign bodies or soft tissue gas identified. Moderate soft tissue swelling about the ankle. No evidence of acute fracture or dislocation. No destructive bone lesions. Focal areas of sclerosis in the distal tibial shaft and proximal fifth metatarsal bone likely representing benign bone islands. Joint spaces are normal. IMPRESSION: Soft tissue swelling over the dorsum of the left foot and about the left ankle. No acute displaced fractures are identified. Electronically Signed   By: Burman Nieves M.D.   On: 02/05/2021 21:29   DG Foot Complete Left  Result Date: 02/05/2021 CLINICAL DATA:  Diffuse swelling. Left lower extremity swelling and pain from the foot to the knee for 6 days. EXAM: LEFT FOOT - COMPLETE 3+ VIEW; LEFT ANKLE COMPLETE - 3+ VIEW COMPARISON:  None. FINDINGS: Three views of the left foot and three views of the left ankle are obtained. There is prominent dorsal soft tissue swelling over the left foot. No radiopaque foreign bodies or soft tissue gas identified. Moderate soft tissue swelling about the ankle. No evidence of acute fracture or dislocation. No destructive bone lesions. Focal areas of sclerosis in the distal tibial shaft and proximal fifth metatarsal bone likely representing  benign bone islands. Joint spaces are normal. IMPRESSION: Soft tissue swelling  over the dorsum of the left foot and about the left ankle. No acute displaced fractures are identified. Electronically Signed   By: Burman NievesWilliam  Stevens M.D.   On: 02/05/2021 21:29    Procedures Procedures   Medications Ordered in ED Medications - No data to display  ED Course  I have reviewed the triage vital signs and the nursing notes.  Pertinent labs & imaging results that were available during my care of the patient were reviewed by me and considered in my medical decision making (see chart for details).    MDM Rules/Calculators/A&P                          Initial impression-patient presents with left leg swelling.  He is alert, does not appear acute stress, vital signs are for tachycardia.  Due to new heart murmur, tachycardia, with unexplained left leg swelling I am concerned for possible endocarditis/septic emboli, PE, DVT.  Will obtain imaging, lab work-up and reassess.  Work-up-imaging of left foot and ankle both negative for acute findings.  Reassessment-notified by nursing staff that patient is refusing all blood work at this time.  spoke with the patient I explained to him that it is important that we get this lab work-up as I concern for septic emboli,endocarditis, DVT, PE  these can be life-threatening and it is prudent that we obtain  lab work for further evaluation.  Patient states he has PTSD and does not want this, he states that he rather go home and come back tomorrow or go over to [place].  I explained to him that if he does there is a chance that he may die or his condition may worsen he states he understood and would like to go home.  Patient is alert and oriented at this time, is able to make his own medical decisions.  We discussed the nature and purpose, risks and benefits, as well as, the alternatives of treatment. Time was given to allow the opportunity to ask questions and consider their options, and after the discussion, the patient decided to refuse the offerred  treatment. The patient was informed that refusal could lead to, but was not limited to, death, permanent disability, or severe pain. If present, I asked the relatives or significant others to dissuade them without success. Prior to refusing, I determined that the patient had the capacity to make their decision and understood the consequences of that decision. After refusal, I made every reasonable opportunity to treat them to the best of my ability.  The patient was notified that they may return to the emergency department at any time for further treatment.    Plan-I have schedule an outpatient ultrasound for rule out of a DVT, will defer anticoagulant as I cannot assess his platelet count.      Final Clinical Impression(s) / ED Diagnoses Final diagnoses:  Localized swelling of left lower leg    Rx / DC Orders ED Discharge Orders          Ordered    US Venous Img Lower Bilateral        02/05/21 2140             Barnie DelFaulkner, Kashonda Sarkisyan J, PA-C 02/05/21 2327    Vanetta MuldersZackowski, Scott, MD 02/09/21 2005

## 2021-02-05 NOTE — ED Notes (Signed)
Pt refusing lab work, repeat VS, and EKG at this time. PA notified.

## 2021-02-06 ENCOUNTER — Ambulatory Visit (HOSPITAL_COMMUNITY): Admission: RE | Admit: 2021-02-06 | Payer: Medicaid Other | Source: Ambulatory Visit

## 2021-02-24 ENCOUNTER — Emergency Department (HOSPITAL_COMMUNITY): Payer: Medicaid Other

## 2021-02-24 ENCOUNTER — Other Ambulatory Visit: Payer: Self-pay

## 2021-02-24 ENCOUNTER — Encounter (HOSPITAL_COMMUNITY): Payer: Self-pay | Admitting: *Deleted

## 2021-02-24 ENCOUNTER — Emergency Department (HOSPITAL_COMMUNITY)
Admission: EM | Admit: 2021-02-24 | Discharge: 2021-02-24 | Disposition: A | Discharge status: Home / Self Care | Payer: Medicaid Other | Attending: Emergency Medicine | Admitting: Emergency Medicine

## 2021-02-24 DIAGNOSIS — M79605 Pain in left leg: Secondary | ICD-10-CM

## 2021-02-24 DIAGNOSIS — R2242 Localized swelling, mass and lump, left lower limb: Secondary | ICD-10-CM | POA: Insufficient documentation

## 2021-02-24 DIAGNOSIS — F17210 Nicotine dependence, cigarettes, uncomplicated: Secondary | ICD-10-CM | POA: Insufficient documentation

## 2021-02-24 LAB — BASIC METABOLIC PANEL
Anion gap: 10 (ref 5–15)
BUN: 19 mg/dL (ref 6–20)
CO2: 30 mmol/L (ref 22–32)
Calcium: 9.9 mg/dL (ref 8.9–10.3)
Chloride: 100 mmol/L (ref 98–111)
Creatinine, Ser: 1.03 mg/dL (ref 0.61–1.24)
GFR, Estimated: >60 mL/min (ref >60)
Glucose, Bld: 87 mg/dL (ref 70–99)
Potassium: 3.8 mmol/L (ref 3.5–5.1)
Sodium: 140 mmol/L (ref 135–145)

## 2021-02-24 LAB — CBC WITH DIFFERENTIAL/PLATELET
Abs Immature Granulocytes: 0.05 10*3/uL (ref 0.00–0.07)
Basophils Absolute: 0 10*3/uL (ref 0.0–0.1)
Basophils Relative: 0 %
Eosinophils Absolute: 0 10*3/uL (ref 0.0–0.5)
Eosinophils Relative: 0 %
HCT: 32.6 % — ABNORMAL LOW (ref 39.0–52.0)
Hemoglobin: 9.1 g/dL — ABNORMAL LOW (ref 13.0–17.0)
Immature Granulocytes: 1 %
Lymphocytes Relative: 23 %
Lymphs Abs: 2 10*3/uL (ref 0.7–4.0)
MCH: 22 pg — ABNORMAL LOW (ref 26.0–34.0)
MCHC: 27.9 g/dL — ABNORMAL LOW (ref 30.0–36.0)
MCV: 78.7 fL — ABNORMAL LOW (ref 80.0–100.0)
Monocytes Absolute: 0.9 10*3/uL (ref 0.1–1.0)
Monocytes Relative: 10 %
Neutro Abs: 5.7 10*3/uL (ref 1.7–7.7)
Neutrophils Relative %: 66 %
Platelets: 323 10*3/uL (ref 150–400)
RBC: 4.14 MIL/uL — ABNORMAL LOW (ref 4.22–5.81)
RDW: 19.5 % — ABNORMAL HIGH (ref 11.5–15.5)
WBC: 8.7 10*3/uL (ref 4.0–10.5)
nRBC: 0 % (ref 0.0–0.2)

## 2021-02-24 MED ORDER — NAPROXEN 500 MG PO TABS: 500.0000 mg | ORAL_TABLET | Freq: Two times a day (BID) | ORAL | 0 refills | Status: DC

## 2021-02-24 NOTE — ED Triage Notes (Signed)
Pain in left foot , seen here for same on 02/05/2021

## 2021-02-24 NOTE — Discharge Instructions (Addendum)
The ultrasound of your legs did not show evidence of a blood clot.  I recommend that you elevate your leg when possible.  Take the prescribed medication as directed.  Call your primary doctor to arrange a follow-up appointment in the emergency department for any new or worsening symptoms.

## 2021-02-24 NOTE — ED Provider Notes (Signed)
[facility] EMERGENCY DEPARTMENT Provider Note   CSN: [id number] Arrival date & time: 02/24/21  1254     History Chief Complaint  Patient presents with   Foot Injury    Vincent Green is a 25 y.o. male.   Foot Injury Associated symptoms: no fatigue, no fever and no neck pain        Vincent Green is a 25 y.o. male with past medical history of anxiety, PTSD, and seizures who presents to the Emergency Department complaining of persistent left foot and lower leg pain and swelling.  He was seen here on 02/05/2021 for similar symptoms.  He was noted to have a new undocumented heart murmur and there was concern for possible septic emboli/DVT.  Patient was recommended to have outpatient ultrasound but patient left AMA, refusing treatment.  He returns today stating that his left foot and leg pain have worsened.  He has not had primary care follow-up since his initial ER visit.  He describes his foot pain as sharp, burning and stinging.  He denies any new injuries, chest pain, shortness of breath, and fever.    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 per pt (documented 08/12/19)    Patient Active Problem List   Diagnosis Date Noted   GERD (gastroesophageal reflux disease) 08/12/2019   Constipation 08/12/2019   Rectal bleeding 08/12/2019   Microcytic anemia 08/12/2019   RUQ pain 08/12/2019    Past Surgical History:  Procedure Laterality Date   CHOANAL ATRESIA REPAIR     NOSE SURGERY     SKIN GRAFT         Family History  Problem Relation Age of Onset   Other Mother 30       04/2019 autopsy pending   GER disease Paternal Grandmother    Colon cancer Neg Hx     Social History   Tobacco Use   Smoking status: Every Day    Packs/day: 0.50    Types: Cigarettes   Smokeless  tobacco: Never   Tobacco comments:    smokes 6 cigarettes daily  Vaping Use   Vaping Use: Former  Substance Use Topics   Alcohol use: No   Drug use: Not Currently    Types: Benzodiazepines, Marijuana    Home Medications Prior to Admission medications   Medication Sig Start Date End Date Taking? Authorizing Provider  albuterol (PROVENTIL HFA;VENTOLIN HFA) 108 (90 Base) MCG/ACT inhaler Inhale 1-2 puffs into the lungs every 6 (six) hours as needed for wheezing or shortness of breath. 02/10/17   Deatra Canter, FNP  amitriptyline (ELAVIL) 25 MG tablet Take 1 tablet (25 mg total) by mouth at bedtime. Patient not taking: No sig reported 10/31/18   Melene Plan, DO  chlorhexidine (PERIDEX) 0.12 % solution Use as directed 15 mLs in the mouth or throat 2 (two) times daily. Patient not taking: No sig reported 10/14/19   Wurst, [place], PA-C  clotrimazole (LOTRIMIN) 1 % cream Apply to affected area 2 times daily Patient not taking: No sig reported 10/14/19   Wurst, [place], PA-C  cyclobenzaprine (FLEXERIL) 10 MG tablet cyclobenzaprine 10 mg tablet  Take 1 tablet twice a day by oral route. Patient not taking: No sig reported    [provider]  DEPAKOTE  500 MG DR tablet Take 500 mg by mouth. Takes 1 in am and 2 in pm 07/03/19   [provider]  diazepam (VALIUM) 5 MG tablet Take 5 mg by mouth every 12 (twelve) hours as needed for anxiety or muscle spasms.    [provider]  doxepin (SINEQUAN) 25 MG capsule Take 25 mg by mouth 3 (three) times daily. Patient not taking: No sig reported 10/01/18   [provider]  famotidine (PEPCID) 20 MG tablet Take 1 tablet (20 mg total) by mouth 2 (two) times daily. 10/25/20   Gerhard MunchLockwood, Robert, MD  linaclotide [facility](LINZESS) 290 MCG CAPS capsule Take 1 capsule (290 mcg total) by mouth daily before breakfast. Patient not taking: No sig reported 08/12/19   Tiffany KocherLewis, Leslie S, PA-C  lisdexamfetamine (VYVANSE) 60 MG capsule Take 60 mg by mouth  every morning.    [provider]  LORazepam (ATIVAN) 1 MG tablet lorazepam 1 mg tablet  Take 1 tablet twice a day by oral route as needed. Patient not taking: No sig reported    [provider]  pregabalin (LYRICA) 150 MG capsule Take 300 mg by mouth 2 (two) times daily.    [provider]  sucralfate (CARAFATE) 1 g tablet Take 1 tablet (1 g total) by mouth 4 (four) times daily -  with meals and at bedtime. 10/25/20   Gerhard MunchLockwood, Robert, MD  tiZANidine (ZANAFLEX) 4 MG tablet Take 4 mg by mouth 2 (two) times daily as needed for muscle spasms.    [provider]  zolpidem (AMBIEN) 10 MG tablet Take 10 mg by mouth at bedtime.    [provider]  diphenhydrAMINE (BENADRYL) 25 mg capsule Take 25 mg by mouth every 6 (six) hours as needed.  10/14/19  [provider]  fluticasone (FLONASE) 50 MCG/ACT nasal spray Place 1 spray into both nostrils daily. Patient not taking: Reported on 03/04/2019 02/10/17 03/13/19  Caccavale, Sophia, PA-C  haloperidol (HALDOL) 2 MG tablet Take 1 tablet (2 mg total) by mouth 3 (three) times daily. Patient not taking: Reported on 12/16/2018 09/13/18 03/13/19  Geoffery Lyonselo, Douglas, MD  omeprazole (PRILOSEC) 20 MG capsule Take 1 capsule (20 mg total) by mouth 2 (two) times daily before a meal. Patient not taking: No sig reported 08/12/19 10/25/20  Tiffany KocherLewis, Leslie S, PA-C  promethazine (PHENERGAN) 12.5 MG tablet Take 1 tablet (12.5 mg total) by mouth every 6 (six) hours as needed for nausea or vomiting. 03/13/19 10/14/19  Eber HongMiller, Brian, MD    Allergies    Tessalon [benzonatate]  Review of Systems   Review of Systems  Constitutional:  Negative for chills, fatigue and fever.  Respiratory:  Negative for cough and shortness of breath.   Cardiovascular:  Positive for leg swelling (left foot and lower leg swelling). Negative for chest pain and palpitations.  Gastrointestinal:  Negative for abdominal pain, nausea and vomiting.  Genitourinary:   Negative for dysuria.  Musculoskeletal:  Negative for myalgias, neck pain and neck stiffness.  Skin:  Negative for color change and rash.  Neurological:  Negative for dizziness, syncope, weakness and numbness.  Hematological:  Does not bruise/bleed easily.   Physical Exam Updated Vital Signs BP 123/82 (BP Location: Right Arm)   Pulse 96   Temp 99.5 F (37.5 C) (Oral)   Resp 18   SpO2 100%   Physical Exam Vitals and nursing note reviewed.  Constitutional:      General: He is not in acute distress.    Appearance: Normal appearance.  HENT:     Head: Atraumatic.  Cardiovascular:     Rate and Rhythm: Normal rate and regular rhythm.     Pulses: Normal pulses.  Pulmonary:     Effort: Pulmonary effort is normal. No respiratory distress.     Breath sounds: No wheezing.  Chest:     Chest wall: No tenderness.  Musculoskeletal:        General: Tenderness present. No signs of injury. Normal range of motion.     Comments: Mild edema to dorsal left foot.  Some left calf tenderness, no excessive warmth or edema of the lower leg.  No open wounds or lymphangitis.    Skin:    General: Skin is warm.     Capillary Refill: Capillary refill takes less than 2 seconds.     Findings: No bruising, erythema or rash.  Neurological:     General: No focal deficit present.     Mental Status: He is alert.     Sensory: No sensory deficit.     Motor: No weakness.    ED Results / Procedures / Treatments   Labs (all labs ordered are listed, but only abnormal results are displayed) Labs Reviewed  CBC WITH DIFFERENTIAL/PLATELET - Abnormal; Notable for the following components:      Result Value   RBC 4.14 (*)    Hemoglobin 9.1 (*)    HCT 32.6 (*)    MCV 78.7 (*)    MCH 22.0 (*)    MCHC 27.9 (*)    RDW 19.5 (*)    All other components within normal limits  BASIC METABOLIC PANEL    EKG EKG Interpretation  Date/Time:  Thursday February 24 2021 14:43:51 EDT Ventricular Rate:  88 PR  Interval:  122 QRS Duration: 94 QT Interval:  372 QTC Calculation: 450 R Axis:   94 Text Interpretation: Sinus rhythm with marked sinus arrhythmia Rightward axis Borderline ECG Confirmed by Vanetta Mulders [id number] on 02/24/2021 4:12:48 PM    Radiology US Venous Img Lower Bilateral  Result Date: 02/24/2021 CLINICAL DATA:  Bilateral lower extremity edema EXAM: BILATERAL LOWER EXTREMITY VENOUS DOPPLER ULTRASOUND TECHNIQUE: Gray-scale sonography with graded compression, as well as color Doppler and duplex ultrasound were performed to evaluate the lower extremity deep venous systems from the level of the common femoral vein and including the common femoral, femoral, profunda femoral, popliteal and calf veins including the posterior tibial, peroneal and gastrocnemius veins when visible. The superficial great saphenous vein was also interrogated. Spectral Doppler was utilized to evaluate flow at rest and with distal augmentation maneuvers in the common femoral, femoral and popliteal veins. COMPARISON:  None. FINDINGS: RIGHT LOWER EXTREMITY Common Femoral Vein: No evidence of thrombus. Normal compressibility, respiratory phasicity and response to augmentation. Saphenofemoral Junction: No evidence of thrombus. Normal compressibility and flow on color Doppler imaging. Profunda Femoral Vein: No evidence of thrombus. Normal compressibility and flow on color Doppler imaging. Femoral Vein: No evidence of thrombus. Normal compressibility, respiratory phasicity and response to augmentation. Popliteal Vein: No evidence of thrombus. Normal compressibility, respiratory phasicity and response to augmentation. Calf Veins: No evidence of thrombus. Normal compressibility and flow on color Doppler imaging. Superficial Great Saphenous Vein: No evidence of thrombus. Normal compressibility. Venous Reflux:  None. Other Findings:  None. LEFT LOWER EXTREMITY Common Femoral Vein: No evidence of thrombus. Normal compressibility,  respiratory phasicity and response to augmentation. Saphenofemoral Junction: No evidence of thrombus. Normal compressibility and flow on color Doppler imaging. Profunda Femoral Vein: No evidence of thrombus. Normal  compressibility and flow on color Doppler imaging. Femoral Vein: No evidence of thrombus. Normal compressibility, respiratory phasicity and response to augmentation. Popliteal Vein: No evidence of thrombus. Normal compressibility, respiratory phasicity and response to augmentation. Calf Veins: No evidence of thrombus. Normal compressibility and flow on color Doppler imaging. Superficial Great Saphenous Vein: No evidence of thrombus. Normal compressibility. Venous Reflux:  None. Other Findings:  None. IMPRESSION: No evidence of deep venous thrombosis in either lower extremity. Electronically Signed   By: Malachy Moan M.D.   On: 02/24/2021 15:33    Procedures Procedures   Medications Ordered in ED Medications - No data to display  ED Course  I have reviewed the triage vital signs and the nursing notes.  Pertinent labs & imaging results that were available during my care of the patient were reviewed by me and considered in my medical decision making (see chart for details).    MDM Rules/Calculators/A&P                           Pt here for left foot and lower leg pain and swelling.  Here on 02/05/21 for same. Returns today due to worsening pain.  No injury or hx of DVT/PE.  Denies CP and dyspnea.  On previous visit, it was noted that pt had murmur.  No documented hx of this.  There was concern for possible septic emboli and DVT study ordered as out patient but pt has not had this done.      On exam, he is well-appearing and nontoxic.  No chest pain or shortness of breath.  EKG without acute ischemic change or evidence of endocarditis or pericarditis.  No leukocytosis.  No electrolyte derangement.  History of anemia, hemoglobin near baseline.  Improved from previous.  There is  diffuse tenderness to palp of the left foot w/o erythema, rash or wound.  He describes pain to the plantar surface.  NV intact.  Left lower leg is soft and essentially nontender.  DVT study without evidence of PE.  Low clinical suspicion for PE or septic emboli given that symptoms have been present for 2 weeks.  Source of left foot pain unclear, he describes most of his pain at the plantar surface of his foot.  This could be representative of Planter fasciitis versus injury.  No concerning symptoms for cellulitis.  Patient had Imaging of the left foot and ankle on 02/05/2021 without evidence of bony injury.  I feel that he is appropriate for discharge home.  Will treat with NSAID and he is agreeable to close outpatient follow-up with PCP.  Return precautions were discussed.  Final Clinical Impression(s) / ED Diagnoses Final diagnoses:  Left leg pain    Rx / DC Orders ED Discharge Orders     None        Pauline Aus, PA-C 02/24/21 1726    Vanetta Mulders, MD 02/25/21 [phone]

## 2021-03-01 ENCOUNTER — Other Ambulatory Visit: Payer: Self-pay

## 2021-03-01 ENCOUNTER — Emergency Department (HOSPITAL_BASED_OUTPATIENT_CLINIC_OR_DEPARTMENT_OTHER): Payer: Medicaid Other

## 2021-03-01 ENCOUNTER — Emergency Department (HOSPITAL_COMMUNITY)
Admission: EM | Admit: 2021-03-01 | Discharge: 2021-03-01 | Disposition: A | Discharge status: Home / Self Care | Payer: Medicaid Other | Attending: Emergency Medicine | Admitting: Emergency Medicine

## 2021-03-01 ENCOUNTER — Emergency Department (HOSPITAL_COMMUNITY): Payer: Medicaid Other

## 2021-03-01 DIAGNOSIS — Y92009 Unspecified place in unspecified non-institutional (private) residence as the place of occurrence of the external cause: Secondary | ICD-10-CM | POA: Diagnosis not present

## 2021-03-01 DIAGNOSIS — M79662 Pain in left lower leg: Secondary | ICD-10-CM | POA: Diagnosis not present

## 2021-03-01 DIAGNOSIS — M79672 Pain in left foot: Secondary | ICD-10-CM | POA: Diagnosis present

## 2021-03-01 DIAGNOSIS — W1830XA Fall on same level, unspecified, initial encounter: Secondary | ICD-10-CM | POA: Diagnosis not present

## 2021-03-01 DIAGNOSIS — F17210 Nicotine dependence, cigarettes, uncomplicated: Secondary | ICD-10-CM | POA: Insufficient documentation

## 2021-03-01 MED ORDER — ACETAMINOPHEN 325 MG PO TABS: 650.0000 mg | ORAL_TABLET | Freq: Once | ORAL | Status: DC

## 2021-03-01 MED ORDER — SODIUM CHLORIDE 0.9 % IV BOLUS: 1000.0000 mL | Freq: Once | INTRAVENOUS | Status: DC

## 2021-03-01 NOTE — ED Triage Notes (Addendum)
Pt here POV with c/o of left foot and leg pain. Pt states he fell 2 weeks ago and he is unable to use his left foot at all. Foot cool to the touch. Pulses noted with doppler.

## 2021-03-01 NOTE — ED Notes (Signed)
Pt left AMA. PA notified.  

## 2021-03-01 NOTE — ED Provider Notes (Cosign Needed Addendum)
Emergency Medicine Provider Triage Evaluation Note  Vincent Green , a 25 y.o. male  was evaluated in triage.  Pt complains of foot pain ongoing for 2 weeks after a fall. Seen at [facility] and had ble [place] left  foot and left ankle xray that was negative.  Review of Systems  Positive: Foot/ankle pain Negative: Chest pain  Physical Exam  Ht 6' (1.829 m)   Wt 95 kg   BMI 28.40 kg/m  Gen:   Awake, no distress   Resp:  Normal effort  MSK:   Moves extremities without difficulty  Other:  Ttp to the proximal fibula, ankle and foot on the left side. Dp pulse dopplerable  Medical Decision Making  Medically screening exam initiated at 4:19 PM.  Appropriate orders placed.  Dam Ashraf was informed that the remainder of the evaluation will be completed by another provider, this initial triage assessment does not replace that evaluation, and the importance of remaining in the ED until their evaluation is complete.     Karrie Meres, PA-C 03/01/21 1621    Natallie Ravenscroft S, PA-C 03/01/21 1648

## 2021-03-01 NOTE — Progress Notes (Signed)
Lower extremity venous LT study completed.  Preliminary results relayed to Couture, PA.   See CV Proc for preliminary results report.   Any Mcneice, RDMS, RVT  

## 2021-03-01 NOTE — ED Provider Notes (Signed)
[facility] EMERGENCY DEPARTMENT Provider Note   CSN: [id number] Arrival date & time: 03/01/21  1459     History Chief Complaint  Patient presents with   Foot Pain    Vincent Green is a 25 y.o. male who presents with left foot pain x 3 weeks after fall. Patient states he fell several weeks ago at home and has had significant pain in his foot since. His pain is worse on the plantar aspect of his left foot with increasing tenderness of his toes, ankle, and posterior calf. Dad at bedside reports patient has been intermittently febrile. No recorded temperature. Patient has tried ibuprofen and tylenol every 6 hours for pain without relief.   Patient seen at [facility] on 8/20 and 9/8 for similar symptoms, was found to have new undocumented heart murmur and there was concern for septic emboli/DVT. Workup at that time was negative for acute fractures, negative for DVT, low clinical suspicion for PE or septic emboli. He denies CP, SOB.   Foot Pain Pertinent negatives include no chest pain and no shortness of breath.      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 per pt (documented 08/12/19)    Patient Active Problem List   Diagnosis Date Noted   GERD (gastroesophageal reflux disease) 08/12/2019   Constipation 08/12/2019   Rectal bleeding 08/12/2019   Microcytic anemia 08/12/2019   RUQ pain 08/12/2019    Past Surgical History:  Procedure Laterality Date   CHOANAL ATRESIA REPAIR     NOSE SURGERY     SKIN GRAFT         Family History  Problem Relation Age of Onset   Other Mother 36       04/2019 autopsy pending   GER disease Paternal Grandmother    Colon cancer Neg Hx     Social History   Tobacco Use   Smoking status: Every Day    Packs/day: 0.50    Types:  Cigarettes   Smokeless tobacco: Never   Tobacco comments:    smokes 6 cigarettes daily  Vaping Use   Vaping Use: Former  Substance Use Topics   Alcohol use: No   Drug use: Not Currently    Types: Benzodiazepines, Marijuana    Home Medications Prior to Admission medications   Medication Sig Start Date End Date Taking? Authorizing Provider  albuterol (PROVENTIL HFA;VENTOLIN HFA) 108 (90 Base) MCG/ACT inhaler Inhale 1-2 puffs into the lungs every 6 (six) hours as needed for wheezing or shortness of breath. 02/10/17   Deatra Canter, FNP  amitriptyline (ELAVIL) 25 MG tablet Take 1 tablet (25 mg total) by mouth at bedtime. Patient not taking: No sig reported 10/31/18   Melene Plan, DO  chlorhexidine (PERIDEX) 0.12 % solution Use as directed 15 mLs in the mouth or throat 2 (two) times daily. Patient not taking: No sig reported 10/14/19   Wurst, [place], PA-C  clotrimazole (LOTRIMIN) 1 % cream Apply to affected area 2 times daily Patient not taking: No sig reported 10/14/19   Wurst, [place], PA-C  cyclobenzaprine (FLEXERIL) 10 MG tablet cyclobenzaprine 10 mg tablet  Take 1 tablet twice a day by oral route. Patient not taking: No sig reported    [provider]  DEPAKOTE 500  MG DR tablet Take 500 mg by mouth. Takes 1 in am and 2 in pm 07/03/19   [provider]  diazepam (VALIUM) 5 MG tablet Take 5 mg by mouth every 12 (twelve) hours as needed for anxiety or muscle spasms.    [provider]  doxepin (SINEQUAN) 25 MG capsule Take 25 mg by mouth 3 (three) times daily. Patient not taking: No sig reported 10/01/18   [provider]  famotidine (PEPCID) 20 MG tablet Take 1 tablet (20 mg total) by mouth 2 (two) times daily. 10/25/20   Gerhard Munch, MD  linaclotide [facility]) 290 MCG CAPS capsule Take 1 capsule (290 mcg total) by mouth daily before breakfast. Patient not taking: No sig reported 08/12/19   Tiffany Kocher, PA-C  lisdexamfetamine (VYVANSE) 60 MG  capsule Take 60 mg by mouth every morning.    [provider]  LORazepam (ATIVAN) 1 MG tablet lorazepam 1 mg tablet  Take 1 tablet twice a day by oral route as needed. Patient not taking: No sig reported    [provider]  naproxen (NAPROSYN) 500 MG tablet Take 1 tablet (500 mg total) by mouth 2 (two) times daily. Take with food 02/24/21   Triplett, Tammy, PA-C  pregabalin (LYRICA) 150 MG capsule Take 300 mg by mouth 2 (two) times daily.    [provider]  sucralfate (CARAFATE) 1 g tablet Take 1 tablet (1 g total) by mouth 4 (four) times daily -  with meals and at bedtime. 10/25/20   Gerhard Munch, MD  tiZANidine (ZANAFLEX) 4 MG tablet Take 4 mg by mouth 2 (two) times daily as needed for muscle spasms.    [provider]  zolpidem (AMBIEN) 10 MG tablet Take 10 mg by mouth at bedtime.    [provider]  diphenhydrAMINE (BENADRYL) 25 mg capsule Take 25 mg by mouth every 6 (six) hours as needed.  10/14/19  [provider]  fluticasone (FLONASE) 50 MCG/ACT nasal spray Place 1 spray into both nostrils daily. Patient not taking: Reported on 03/04/2019 02/10/17 03/13/19  Caccavale, Sophia, PA-C  haloperidol (HALDOL) 2 MG tablet Take 1 tablet (2 mg total) by mouth 3 (three) times daily. Patient not taking: Reported on 12/16/2018 09/13/18 03/13/19  Geoffery Lyons, MD  omeprazole (PRILOSEC) 20 MG capsule Take 1 capsule (20 mg total) by mouth 2 (two) times daily before a meal. Patient not taking: No sig reported 08/12/19 10/25/20  Tiffany Kocher, PA-C  promethazine (PHENERGAN) 12.5 MG tablet Take 1 tablet (12.5 mg total) by mouth every 6 (six) hours as needed for nausea or vomiting. 03/13/19 10/14/19  Eber Hong, MD    Allergies    Tessalon [benzonatate]  Review of Systems   Review of Systems  Constitutional:  Positive for chills and fever.  Respiratory:  Negative for shortness of breath.   Cardiovascular:  Negative for chest pain.  Skin:  Positive for  color change.       Redness of left foot, ankle, and calf  All other systems reviewed and are negative.  Physical Exam Updated Vital Signs BP 114/84   Pulse (!) 120   Temp 99.1 F (37.3 C) (Oral)   Resp 18   Ht 6' (1.829 m)   Wt 95 kg   SpO2 100%   BMI 28.40 kg/m   Physical Exam Vitals and nursing note reviewed.  Constitutional:      Appearance: Normal appearance.  HENT:     Head: Normocephalic and atraumatic.  Eyes:  Conjunctiva/sclera: Conjunctivae normal.  Pulmonary:     Effort: Pulmonary effort is normal. No respiratory distress.  Musculoskeletal:     Comments: Normal ROM left knee. Decreased passive flexion and extension of left ankle and toes due to pain.  Skin:    General: Skin is warm and dry.     Comments: Palpable knot on dorsum of left foot with exquisite tenderness. Diffuse erythema of left foot, ankle, and posterior calf without skin breakdown.   Neurological:     Mental Status: He is alert.  Psychiatric:        Mood and Affect: Mood normal.        Behavior: Behavior normal.    ED Results / Procedures / Treatments   Labs (all labs ordered are listed, but only abnormal results are displayed) Labs Reviewed  CBC WITH DIFFERENTIAL/PLATELET  BASIC METABOLIC PANEL    EKG None  Radiology DG Ankle Complete Left  Result Date: 03/01/2021 CLINICAL DATA:  Left ankle pain EXAM: LEFT ANKLE COMPLETE - 3+ VIEW COMPARISON:  None. FINDINGS: There is no evidence of fracture, dislocation, or joint effusion. There is no evidence of arthropathy or other focal bone abnormality. Benign bone island within the distal tibial diaphysis. Soft tissues are unremarkable. IMPRESSION: Negative. Electronically Signed   By: Helyn Numbers M.D.   On: 03/01/2021 17:57   DG Knee Complete 4 Views Left  Result Date: 03/01/2021 CLINICAL DATA:  Left knee pain EXAM: LEFT KNEE - COMPLETE 4+ VIEW COMPARISON:  None. FINDINGS: Mild overall straightening. No fracture or dislocation. Joint  spaces are preserved. No effusion. Soft tissues are unremarkable. IMPRESSION: Negative. Electronically Signed   By: Helyn Numbers M.D.   On: 03/01/2021 17:55   DG Foot Complete Left  Result Date: 03/01/2021 CLINICAL DATA:  Left foot pain EXAM: LEFT FOOT - COMPLETE 3+ VIEW COMPARISON:  None. FINDINGS: There is no evidence of fracture or dislocation. There is no evidence of arthropathy or other focal bone abnormality. Benign bone island within the base of the fifth metatarsal and distal tibial diaphysis. Soft tissues are unremarkable. IMPRESSION: Negative. Electronically Signed   By: Helyn Numbers M.D.   On: 03/01/2021 17:56   VAS [place] LOWER EXTREMITY VENOUS (DVT) ([facility] and [facility] 7a-7p)  Result Date: 03/01/2021  Lower Venous DVT Study Patient Name:  Vincent Green  Date of Exam:   03/01/2021 Medical Rec #: [id number]        Accession #:    [id number] Date of Birth: [birth year]        Patient Gender: M Patient Age:   44 years Exam Location:  [facility] Procedure:      VAS [place] LOWER EXTREMITY VENOUS (DVT) Referring Phys: Christianne Dolin COUTURE --------------------------------------------------------------------------------  Indications: Pain, s/p fall two weeks ago.  Comparison Study: 02-24-2021 Bilateral lower extremity venous study was negative                   for DVT. Performing Technologist: Jean Rosenthal RDMS, RVT  Examination Guidelines: A complete evaluation includes B-mode imaging, spectral Doppler, color Doppler, and power Doppler as needed of all accessible portions of each vessel. Bilateral testing is considered an integral part of a complete examination. Limited examinations for reoccurring indications may be performed as noted. The reflux portion of the exam is performed with the patient in reverse Trendelenburg.  +-----+---------------+---------+-----------+----------+--------------+ RIGHTCompressibilityPhasicitySpontaneityPropertiesThrombus Aging  +-----+---------------+---------+-----------+----------+--------------+ CFV  Full           Yes      Yes                                 +-----+---------------+---------+-----------+----------+--------------+   +---------+---------------+---------+-----------+----------+--------------+  LEFT     CompressibilityPhasicitySpontaneityPropertiesThrombus Aging +---------+---------------+---------+-----------+----------+--------------+ CFV      Full           Yes      Yes                                 +---------+---------------+---------+-----------+----------+--------------+ SFJ      Full                                                        +---------+---------------+---------+-----------+----------+--------------+ FV Prox  Full                                                        +---------+---------------+---------+-----------+----------+--------------+ FV Mid   Full                                                        +---------+---------------+---------+-----------+----------+--------------+ FV DistalFull                                                        +---------+---------------+---------+-----------+----------+--------------+ PFV      Full                                                        +---------+---------------+---------+-----------+----------+--------------+ POP      Full           Yes      Yes                                 +---------+---------------+---------+-----------+----------+--------------+ PTV      Full                                                        +---------+---------------+---------+-----------+----------+--------------+ PERO     Full                                                        +---------+---------------+---------+-----------+----------+--------------+ Gastroc  Full                                                         +---------+---------------+---------+-----------+----------+--------------+  Summary: RIGHT: - No evidence of common femoral vein obstruction.  LEFT: - There is no evidence of deep vein thrombosis in the lower extremity.  - No cystic structure found in the popliteal fossa.  *See table(s) above for measurements and observations.    Preliminary     Procedures Procedures   Medications Ordered in ED Medications  acetaminophen (TYLENOL) tablet 650 mg (650 mg Oral Patient Refused/Not Given 03/01/21 1809)  sodium chloride 0.9 % bolus 1,000 mL (has no administration in time range)    ED Course  I have reviewed the triage vital signs and the nursing notes.  Pertinent labs & imaging results that were available during my care of the patient were reviewed by me and considered in my medical decision making (see chart for details).    MDM Rules/Calculators/A&P                           Patient is 25 y/o male who presents with left foot and ankle pain after fall x 3 weeks ago. Initial workup on 8/20 and 9/8 showed no evidence of fracture or DVT. Reports intermittent fevers. Today patient has persistent pain. On exam he is afebrile, he has significant tenderness to palpation of left foot and ankle, worse on the plantar aspect. He is tachycardic to 120 bpm.   XR of left knee, ankle, and foot negative for fractures or dislocations. Ultrasound shows no DVT. Concern for osteomyelitis due to reported fevers and tachycardia. Ordered MRI and lab work.   Patient eloped before further workup or before discussing with provider.   Final Clinical Impression(s) / ED Diagnoses Final diagnoses:  Left foot pain    Rx / DC Orders ED Discharge Orders     None        Dianara Smullen T, PA-C 03/01/21 1902    Franne Forts, DO 03/02/21 0000

## 2021-04-24 ENCOUNTER — Encounter (HOSPITAL_COMMUNITY): Payer: Self-pay | Admitting: *Deleted

## 2021-04-24 ENCOUNTER — Inpatient Hospital Stay (HOSPITAL_COMMUNITY): Payer: Medicaid Other

## 2021-04-24 ENCOUNTER — Inpatient Hospital Stay (HOSPITAL_COMMUNITY)
Admission: EM | Admit: 2021-04-24 | Discharge: 2021-05-04 | DRG: 208 | Disposition: A | Discharge status: Home / Self Care | Payer: Medicaid Other | Attending: Internal Medicine | Admitting: Internal Medicine

## 2021-04-24 ENCOUNTER — Emergency Department (HOSPITAL_COMMUNITY): Payer: Medicaid Other

## 2021-04-24 ENCOUNTER — Other Ambulatory Visit: Payer: Self-pay

## 2021-04-24 DIAGNOSIS — K3189 Other diseases of stomach and duodenum: Secondary | ICD-10-CM | POA: Diagnosis present

## 2021-04-24 DIAGNOSIS — K2970 Gastritis, unspecified, without bleeding: Secondary | ICD-10-CM | POA: Diagnosis not present

## 2021-04-24 DIAGNOSIS — R569 Unspecified convulsions: Secondary | ICD-10-CM | POA: Diagnosis not present

## 2021-04-24 DIAGNOSIS — G4700 Insomnia, unspecified: Secondary | ICD-10-CM | POA: Diagnosis present

## 2021-04-24 DIAGNOSIS — R5381 Other malaise: Secondary | ICD-10-CM | POA: Diagnosis present

## 2021-04-24 DIAGNOSIS — T50901A Poisoning by unspecified drugs, medicaments and biological substances, accidental (unintentional), initial encounter: Secondary | ICD-10-CM

## 2021-04-24 DIAGNOSIS — D62 Acute posthemorrhagic anemia: Secondary | ICD-10-CM | POA: Diagnosis not present

## 2021-04-24 DIAGNOSIS — Z978 Presence of other specified devices: Secondary | ICD-10-CM

## 2021-04-24 DIAGNOSIS — R011 Cardiac murmur, unspecified: Secondary | ICD-10-CM | POA: Diagnosis not present

## 2021-04-24 DIAGNOSIS — I959 Hypotension, unspecified: Secondary | ICD-10-CM | POA: Diagnosis present

## 2021-04-24 DIAGNOSIS — K2990 Gastroduodenitis, unspecified, without bleeding: Secondary | ICD-10-CM | POA: Diagnosis not present

## 2021-04-24 DIAGNOSIS — L03116 Cellulitis of left lower limb: Secondary | ICD-10-CM | POA: Diagnosis present

## 2021-04-24 DIAGNOSIS — F17210 Nicotine dependence, cigarettes, uncomplicated: Secondary | ICD-10-CM | POA: Diagnosis present

## 2021-04-24 DIAGNOSIS — R1319 Other dysphagia: Secondary | ICD-10-CM

## 2021-04-24 DIAGNOSIS — Z6823 Body mass index (BMI) 23.0-23.9, adult: Secondary | ICD-10-CM

## 2021-04-24 DIAGNOSIS — Z79899 Other long term (current) drug therapy: Secondary | ICD-10-CM

## 2021-04-24 DIAGNOSIS — R339 Retention of urine, unspecified: Secondary | ICD-10-CM | POA: Diagnosis present

## 2021-04-24 DIAGNOSIS — K2100 Gastro-esophageal reflux disease with esophagitis, without bleeding: Secondary | ICD-10-CM | POA: Diagnosis present

## 2021-04-24 DIAGNOSIS — K222 Esophageal obstruction: Secondary | ICD-10-CM | POA: Diagnosis present

## 2021-04-24 DIAGNOSIS — R092 Respiratory arrest: Secondary | ICD-10-CM | POA: Diagnosis present

## 2021-04-24 DIAGNOSIS — R680 Hypothermia, not associated with low environmental temperature: Secondary | ICD-10-CM | POA: Diagnosis present

## 2021-04-24 DIAGNOSIS — G9341 Metabolic encephalopathy: Secondary | ICD-10-CM | POA: Diagnosis not present

## 2021-04-24 DIAGNOSIS — L03115 Cellulitis of right lower limb: Secondary | ICD-10-CM | POA: Diagnosis present

## 2021-04-24 DIAGNOSIS — G8929 Other chronic pain: Secondary | ICD-10-CM | POA: Diagnosis present

## 2021-04-24 DIAGNOSIS — J9601 Acute respiratory failure with hypoxia: Secondary | ICD-10-CM | POA: Diagnosis present

## 2021-04-24 DIAGNOSIS — T424X5A Adverse effect of benzodiazepines, initial encounter: Secondary | ICD-10-CM | POA: Diagnosis present

## 2021-04-24 DIAGNOSIS — N509 Disorder of male genital organs, unspecified: Secondary | ICD-10-CM | POA: Diagnosis present

## 2021-04-24 DIAGNOSIS — M419 Scoliosis, unspecified: Secondary | ICD-10-CM | POA: Diagnosis present

## 2021-04-24 DIAGNOSIS — E440 Moderate protein-calorie malnutrition: Secondary | ICD-10-CM | POA: Diagnosis present

## 2021-04-24 DIAGNOSIS — E162 Hypoglycemia, unspecified: Secondary | ICD-10-CM | POA: Diagnosis present

## 2021-04-24 DIAGNOSIS — K2950 Unspecified chronic gastritis without bleeding: Secondary | ICD-10-CM | POA: Diagnosis present

## 2021-04-24 DIAGNOSIS — E8809 Other disorders of plasma-protein metabolism, not elsewhere classified: Secondary | ICD-10-CM | POA: Diagnosis present

## 2021-04-24 DIAGNOSIS — G928 Other toxic encephalopathy: Secondary | ICD-10-CM | POA: Diagnosis present

## 2021-04-24 DIAGNOSIS — M7989 Other specified soft tissue disorders: Secondary | ICD-10-CM | POA: Diagnosis not present

## 2021-04-24 DIAGNOSIS — Z20822 Contact with and (suspected) exposure to covid-19: Secondary | ICD-10-CM | POA: Diagnosis present

## 2021-04-24 DIAGNOSIS — L03119 Cellulitis of unspecified part of limb: Secondary | ICD-10-CM

## 2021-04-24 DIAGNOSIS — T43625A Adverse effect of amphetamines, initial encounter: Secondary | ICD-10-CM | POA: Diagnosis present

## 2021-04-24 DIAGNOSIS — R609 Edema, unspecified: Secondary | ICD-10-CM | POA: Diagnosis not present

## 2021-04-24 DIAGNOSIS — J690 Pneumonitis due to inhalation of food and vomit: Secondary | ICD-10-CM | POA: Diagnosis present

## 2021-04-24 DIAGNOSIS — G40909 Epilepsy, unspecified, not intractable, without status epilepticus: Secondary | ICD-10-CM | POA: Diagnosis present

## 2021-04-24 DIAGNOSIS — F4310 Post-traumatic stress disorder, unspecified: Secondary | ICD-10-CM

## 2021-04-24 DIAGNOSIS — D509 Iron deficiency anemia, unspecified: Secondary | ICD-10-CM | POA: Diagnosis present

## 2021-04-24 DIAGNOSIS — F32A Depression, unspecified: Secondary | ICD-10-CM | POA: Diagnosis present

## 2021-04-24 DIAGNOSIS — K209 Esophagitis, unspecified without bleeding: Secondary | ICD-10-CM

## 2021-04-24 DIAGNOSIS — Z781 Physical restraint status: Secondary | ICD-10-CM

## 2021-04-24 DIAGNOSIS — R1314 Dysphagia, pharyngoesophageal phase: Secondary | ICD-10-CM | POA: Diagnosis present

## 2021-04-24 DIAGNOSIS — J9600 Acute respiratory failure, unspecified whether with hypoxia or hypercapnia: Secondary | ICD-10-CM

## 2021-04-24 DIAGNOSIS — E876 Hypokalemia: Secondary | ICD-10-CM | POA: Diagnosis present

## 2021-04-24 HISTORY — DX: Insomnia, unspecified: G47.00

## 2021-04-24 HISTORY — DX: Attention-deficit hyperactivity disorder, unspecified type: F90.9

## 2021-04-24 HISTORY — DX: Chronic obstructive pulmonary disease, unspecified: J44.9

## 2021-04-24 HISTORY — DX: Tachycardia, unspecified: R00.0

## 2021-04-24 LAB — POCT I-STAT 7, (LYTES, BLD GAS, ICA,H+H)
Acid-Base Excess: 0 mmol/L (ref 0.0–2.0)
Bicarbonate: 21.4 mmol/L (ref 20.0–28.0)
Calcium, Ion: 1.1 mmol/L — ABNORMAL LOW (ref 1.15–1.40)
HCT: 22 % — ABNORMAL LOW (ref 39.0–52.0)
Hemoglobin: 7.5 g/dL — ABNORMAL LOW (ref 13.0–17.0)
O2 Saturation: 99 %
Patient temperature: 36.4
Potassium: 3.1 mmol/L — ABNORMAL LOW (ref 3.5–5.1)
Sodium: 139 mmol/L (ref 135–145)
TCO2: 22 mmol/L (ref 22–32)
pCO2 arterial: 22.8 mmHg — ABNORMAL LOW (ref 32.0–48.0)
pH, Arterial: 7.579 — ABNORMAL HIGH (ref 7.350–7.450)
pO2, Arterial: 120 mmHg — ABNORMAL HIGH (ref 83.0–108.0)

## 2021-04-24 LAB — CBC WITH DIFFERENTIAL/PLATELET
Abs Immature Granulocytes: 0.02 10*3/uL (ref 0.00–0.07)
Basophils Absolute: 0 10*3/uL (ref 0.0–0.1)
Basophils Relative: 0 %
Eosinophils Absolute: 0 10*3/uL (ref 0.0–0.5)
Eosinophils Relative: 0 %
HCT: 22.2 % — ABNORMAL LOW (ref 39.0–52.0)
Hemoglobin: 6 g/dL — CL (ref 13.0–17.0)
Immature Granulocytes: 0 %
Lymphocytes Relative: 15 %
Lymphs Abs: 1 10*3/uL (ref 0.7–4.0)
MCH: 22.4 pg — ABNORMAL LOW (ref 26.0–34.0)
MCHC: 27 g/dL — ABNORMAL LOW (ref 30.0–36.0)
MCV: 82.8 fL (ref 80.0–100.0)
Monocytes Absolute: 0.3 10*3/uL (ref 0.1–1.0)
Monocytes Relative: 5 %
Neutro Abs: 5 10*3/uL (ref 1.7–7.7)
Neutrophils Relative %: 80 %
Platelets: 273 10*3/uL (ref 150–400)
RBC: 2.68 MIL/uL — ABNORMAL LOW (ref 4.22–5.81)
RDW: 19.8 % — ABNORMAL HIGH (ref 11.5–15.5)
WBC: 6.3 10*3/uL (ref 4.0–10.5)
nRBC: 0 % (ref 0.0–0.2)

## 2021-04-24 LAB — URINALYSIS, ROUTINE W REFLEX MICROSCOPIC
Bilirubin Urine: NEGATIVE
Glucose, UA: NEGATIVE mg/dL
Hgb urine dipstick: NEGATIVE
Ketones, ur: 20 mg/dL — AB
Leukocytes,Ua: NEGATIVE
Nitrite: NEGATIVE
Protein, ur: NEGATIVE mg/dL
Specific Gravity, Urine: 1.014 (ref 1.005–1.030)
pH: 5 (ref 5.0–8.0)

## 2021-04-24 LAB — GLUCOSE, CAPILLARY
Glucose-Capillary: 52 mg/dL — ABNORMAL LOW (ref 70–99)
Glucose-Capillary: 124 mg/dL — ABNORMAL HIGH (ref 70–99)
Glucose-Capillary: 102 mg/dL — ABNORMAL HIGH (ref 70–99)
Glucose-Capillary: 77 mg/dL (ref 70–99)

## 2021-04-24 LAB — COMPREHENSIVE METABOLIC PANEL
ALT: 21 U/L (ref 0–44)
AST: 44 U/L — ABNORMAL HIGH (ref 15–41)
Albumin: 2.5 g/dL — ABNORMAL LOW (ref 3.5–5.0)
Alkaline Phosphatase: 218 U/L — ABNORMAL HIGH (ref 38–126)
Anion gap: 7 (ref 5–15)
BUN: 12 mg/dL (ref 6–20)
CO2: 26 mmol/L (ref 22–32)
Calcium: 8.6 mg/dL — ABNORMAL LOW (ref 8.9–10.3)
Chloride: 104 mmol/L (ref 98–111)
Creatinine, Ser: 0.91 mg/dL (ref 0.61–1.24)
GFR, Estimated: >60 mL/min (ref >60)
Glucose, Bld: 104 mg/dL — ABNORMAL HIGH (ref 70–99)
Potassium: 3.6 mmol/L (ref 3.5–5.1)
Sodium: 137 mmol/L (ref 135–145)
Total Bilirubin: 0.2 mg/dL — ABNORMAL LOW (ref 0.3–1.2)
Total Protein: 5.8 g/dL — ABNORMAL LOW (ref 6.5–8.1)

## 2021-04-24 LAB — CBC
HCT: 27.3 % — ABNORMAL LOW (ref 39.0–52.0)
Hemoglobin: 8.1 g/dL — ABNORMAL LOW (ref 13.0–17.0)
MCH: 24 pg — ABNORMAL LOW (ref 26.0–34.0)
MCHC: 29.7 g/dL — ABNORMAL LOW (ref 30.0–36.0)
MCV: 81 fL (ref 80.0–100.0)
Platelets: 263 10*3/uL (ref 150–400)
RBC: 3.37 MIL/uL — ABNORMAL LOW (ref 4.22–5.81)
RDW: 18.3 % — ABNORMAL HIGH (ref 11.5–15.5)
WBC: 13 10*3/uL — ABNORMAL HIGH (ref 4.0–10.5)
nRBC: 0 % (ref 0.0–0.2)

## 2021-04-24 LAB — RAPID URINE DRUG SCREEN, HOSP PERFORMED
Amphetamines: POSITIVE — AB
Barbiturates: NOT DETECTED
Benzodiazepines: POSITIVE — AB
Cocaine: NOT DETECTED
Opiates: NOT DETECTED
Tetrahydrocannabinol: POSITIVE — AB

## 2021-04-24 LAB — POC OCCULT BLOOD, ED: Fecal Occult Bld: NEGATIVE

## 2021-04-24 LAB — MRSA NEXT GEN BY PCR, NASAL: MRSA by PCR Next Gen: NOT DETECTED

## 2021-04-24 LAB — PHOSPHORUS: Phosphorus: 2.3 mg/dL — ABNORMAL LOW (ref 2.5–4.6)

## 2021-04-24 LAB — VALPROIC ACID LEVEL: Valproic Acid Lvl: 30 ug/mL — ABNORMAL LOW (ref 50.0–100.0)

## 2021-04-24 LAB — PREPARE RBC (CROSSMATCH)

## 2021-04-24 LAB — BRAIN NATRIURETIC PEPTIDE: B Natriuretic Peptide: 56 pg/mL (ref 0.0–100.0)

## 2021-04-24 LAB — MAGNESIUM: Magnesium: 1.6 mg/dL — ABNORMAL LOW (ref 1.7–2.4)

## 2021-04-24 LAB — ABO/RH: ABO/RH(D): O POS

## 2021-04-24 LAB — HIV ANTIBODY (ROUTINE TESTING W REFLEX): HIV Screen 4th Generation wRfx: NONREACTIVE

## 2021-04-24 MED ORDER — MIDAZOLAM HCL 5 MG/5ML IJ SOLN
2.5000 mg | Freq: Once | INTRAMUSCULAR | Status: AC
  Administered 2021-04-24: 2.5 mg via INTRAVENOUS
  Filled 2021-04-24: qty 5

## 2021-04-24 MED ORDER — FENTANYL CITRATE (PF) 100 MCG/2ML IJ SOLN
50.0000 ug | INTRAMUSCULAR | Status: DC | PRN
  Administered 2021-04-25 (×3): 100 ug via INTRAVENOUS
  Filled 2021-04-24 (×5): qty 2

## 2021-04-24 MED ORDER — SODIUM CHLORIDE 0.9 % IV SOLN
1.0000 g | Freq: Once | INTRAVENOUS | Status: DC
  Filled 2021-04-24: qty 1

## 2021-04-24 MED ORDER — NALOXONE HCL 2 MG/2ML IJ SOSY
PREFILLED_SYRINGE | INTRAMUSCULAR | Status: AC
  Administered 2021-04-24: 2 mg
  Filled 2021-04-24: qty 2

## 2021-04-24 MED ORDER — SODIUM CHLORIDE 0.9 % IV SOLN
500.0000 mg | Freq: Once | INTRAVENOUS | Status: AC
  Administered 2021-04-24: 500 mg via INTRAVENOUS
  Filled 2021-04-24: qty 500

## 2021-04-24 MED ORDER — POLYETHYLENE GLYCOL 3350 17 G PO PACK: 17.0000 g | PACK | Freq: Every day | ORAL | Status: DC | PRN

## 2021-04-24 MED ORDER — PROPOFOL 1000 MG/100ML IV EMUL
INTRAVENOUS | Status: AC
  Filled 2021-04-24: qty 100

## 2021-04-24 MED ORDER — SODIUM CHLORIDE 0.9 % IV SOLN: 2000.0000 mg | Freq: Once | INTRAVENOUS | Status: DC

## 2021-04-24 MED ORDER — MAGNESIUM SULFATE 2 GM/50ML IV SOLN
2.0000 g | Freq: Once | INTRAVENOUS | Status: AC
  Administered 2021-04-24: 2 g via INTRAVENOUS
  Filled 2021-04-24: qty 50

## 2021-04-24 MED ORDER — VANCOMYCIN HCL 1500 MG/300ML IV SOLN
1500.0000 mg | Freq: Two times a day (BID) | INTRAVENOUS | Status: DC
  Administered 2021-04-25 (×2): 1500 mg via INTRAVENOUS
  Filled 2021-04-24 (×2): qty 300

## 2021-04-24 MED ORDER — VITAL HIGH PROTEIN PO LIQD
1000.0000 mL | ORAL | Status: DC
  Administered 2021-04-24: 1000 mL

## 2021-04-24 MED ORDER — LEVETIRACETAM IN NACL 1000 MG/100ML IV SOLN
1000.0000 mg | INTRAVENOUS | Status: AC
  Administered 2021-04-24 (×2): 1000 mg via INTRAVENOUS
  Filled 2021-04-24 (×2): qty 100

## 2021-04-24 MED ORDER — SODIUM CHLORIDE 0.9 % IV BOLUS
500.0000 mL | Freq: Once | INTRAVENOUS | Status: AC
  Administered 2021-04-24: 500 mL via INTRAVENOUS

## 2021-04-24 MED ORDER — DEXTROSE 50 % IV SOLN
INTRAVENOUS | Status: AC
  Administered 2021-04-24: 50 mL
  Filled 2021-04-24: qty 50

## 2021-04-24 MED ORDER — SODIUM CHLORIDE 0.9% IV SOLUTION: Freq: Once | INTRAVENOUS | Status: DC

## 2021-04-24 MED ORDER — FAMOTIDINE 40 MG/5ML PO SUSR
20.0000 mg | Freq: Two times a day (BID) | ORAL | Status: DC
  Administered 2021-04-24 – 2021-04-25 (×3): 20 mg
  Filled 2021-04-24 (×5): qty 2.5

## 2021-04-24 MED ORDER — SODIUM CHLORIDE 0.9 % IV SOLN: 1.0000 g | INTRAVENOUS | Status: DC

## 2021-04-24 MED ORDER — ETOMIDATE 2 MG/ML IV SOLN
INTRAVENOUS | Status: AC
  Administered 2021-04-24: 20 mg via INTRAVENOUS
  Filled 2021-04-24: qty 10

## 2021-04-24 MED ORDER — SODIUM CHLORIDE 0.9 % IV SOLN
10.0000 mL/h | Freq: Once | INTRAVENOUS | Status: AC
  Administered 2021-04-24: 10 mL/h via INTRAVENOUS

## 2021-04-24 MED ORDER — ETOMIDATE 2 MG/ML IV SOLN
20.0000 mg | Freq: Once | INTRAVENOUS | Status: AC
  Administered 2021-04-24: 20 mg via INTRAVENOUS

## 2021-04-24 MED ORDER — LORAZEPAM 2 MG/ML IJ SOLN
INTRAMUSCULAR | Status: AC
  Administered 2021-04-24: 1 mg via INTRAVENOUS
  Filled 2021-04-24: qty 1

## 2021-04-24 MED ORDER — SODIUM CHLORIDE 0.9 % IV SOLN: 3.0000 g | Freq: Once | INTRAVENOUS | Status: DC

## 2021-04-24 MED ORDER — LORAZEPAM 2 MG/ML IJ SOLN: 1.0000 mg | Freq: Once | INTRAMUSCULAR | Status: AC

## 2021-04-24 MED ORDER — SUCCINYLCHOLINE CHLORIDE 200 MG/10ML IV SOSY
PREFILLED_SYRINGE | INTRAVENOUS | Status: AC
  Filled 2021-04-24: qty 10

## 2021-04-24 MED ORDER — LEVETIRACETAM IN NACL 500 MG/100ML IV SOLN
500.0000 mg | Freq: Two times a day (BID) | INTRAVENOUS | Status: DC
  Administered 2021-04-25 (×2): 500 mg via INTRAVENOUS
  Filled 2021-04-24 (×2): qty 100

## 2021-04-24 MED ORDER — SUCCINYLCHOLINE CHLORIDE 200 MG/10ML IV SOSY: 120.0000 mg | PREFILLED_SYRINGE | Freq: Once | INTRAVENOUS | Status: DC

## 2021-04-24 MED ORDER — SODIUM CHLORIDE 0.9 % IV BOLUS
1000.0000 mL | Freq: Once | INTRAVENOUS | Status: AC
  Administered 2021-04-24: 1000 mL via INTRAVENOUS

## 2021-04-24 MED ORDER — FENTANYL CITRATE (PF) 100 MCG/2ML IJ SOLN
50.0000 ug | INTRAMUSCULAR | Status: DC | PRN
  Administered 2021-04-25 (×2): 50 ug via INTRAVENOUS
  Filled 2021-04-24: qty 2

## 2021-04-24 MED ORDER — LORAZEPAM 2 MG/ML IJ SOLN
2.0000 mg | Freq: Once | INTRAMUSCULAR | Status: AC | PRN
  Administered 2021-04-25: 2 mg via INTRAVENOUS
  Filled 2021-04-24: qty 1

## 2021-04-24 MED ORDER — PROPOFOL 1000 MG/100ML IV EMUL
5.0000 ug/kg/min | INTRAVENOUS | Status: DC
  Administered 2021-04-24: 5 ug/kg/min via INTRAVENOUS
  Administered 2021-04-25: 10 ug/kg/min via INTRAVENOUS
  Filled 2021-04-24: qty 200
  Filled 2021-04-24: qty 100

## 2021-04-24 MED ORDER — VANCOMYCIN HCL 1000 MG/200ML IV SOLN
1000.0000 mg | Freq: Once | INTRAVENOUS | Status: AC
  Administered 2021-04-24: 1000 mg via INTRAVENOUS
  Filled 2021-04-24: qty 200

## 2021-04-24 MED ORDER — DOCUSATE SODIUM 100 MG PO CAPS
100.0000 mg | ORAL_CAPSULE | Freq: Two times a day (BID) | ORAL | Status: DC | PRN
  Administered 2021-04-30: 100 mg via ORAL
  Filled 2021-04-24: qty 1

## 2021-04-24 MED ORDER — HEPARIN SODIUM (PORCINE) 5000 UNIT/ML IJ SOLN
5000.0000 [IU] | Freq: Three times a day (TID) | INTRAMUSCULAR | Status: DC
  Administered 2021-04-24 – 2021-04-26 (×5): 5000 [IU] via SUBCUTANEOUS
  Filled 2021-04-24 (×5): qty 1

## 2021-04-24 NOTE — Progress Notes (Signed)
eLink Physician-Brief Progress Note Patient Name: Vincent Green DOB: [birth year] MRN: [id number]   Date of Service  04/24/2021  HPI/Events of Note  Multiple issues: 1. ABG on 60%/PRVC 22/TV 580/P 5 = 7.579/22.8/120/21.4. 2. Hypomagnesemia - Mg++ = 1.6.  eICU Interventions  Plan: Decrease PRVC rate to 14. ABG at 5 AM. Replace Mg++.     Intervention Category Major Interventions: Electrolyte abnormality - evaluation and management;Respiratory failure - evaluation and management;Acid-Base disturbance - evaluation and management  Eyva Califano Eugene 04/24/2021, 10:10 PM

## 2021-04-24 NOTE — ED Provider Notes (Signed)
[facility] EMERGENCY DEPARTMENT Provider Note   CSN: [id number] Arrival date & time: 04/24/21  1126     History Chief Complaint  Patient presents with   Edema    Vincent Green is a 25 y.o. male.  Patient has a history of COPD seizures posttraumatic stress stress disease and substance abuse.  He presented today with swelling in his legs.  The history is provided by a significant other and a relative.  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 (documented 08/12/19)   Tachycardia     Patient Active Problem List   Diagnosis Date Noted   Respiratory arrest (HCC) 04/24/2021   GERD (gastroesophageal reflux disease) 08/12/2019   Constipation 08/12/2019   Rectal bleeding 08/12/2019   Microcytic anemia 08/12/2019   RUQ pain 08/12/2019    Past Surgical History:  Procedure Laterality Date   CHOANAL ATRESIA REPAIR     NOSE SURGERY     SKIN GRAFT         Family History  Problem Relation Age of Onset   Other Mother 24       04/2019 autopsy pending   GER disease Paternal Grandmother    Colon cancer Neg Hx     Social History   Tobacco Use   Smoking status: Every Day    Packs/day: 0.50    Types: Cigarettes   Smokeless tobacco: Never   Tobacco comments:    smokes 6 cigarettes daily  Vaping Use   Vaping Use: Former  Substance Use Topics   Alcohol use: No   Drug use: Not Currently    Types: Benzodiazepines, Marijuana    Home Medications Prior to  Admission medications   Medication Sig Start Date End Date Taking? Authorizing Provider  albuterol (PROVENTIL HFA;VENTOLIN HFA) 108 (90 Base) MCG/ACT inhaler Inhale 1-2 puffs into the lungs every 6 (six) hours as needed for wheezing or shortness of breath. 02/10/17  Yes Oxford, Anselm Pancoast, FNP  diazepam (VALIUM) 5 MG tablet Take 5 mg by mouth every 12 (twelve) hours as needed for anxiety or muscle spasms.   Yes [provider]  divalproex (DEPAKOTE SPRINKLE) 125 MG capsule Take 250 mg by mouth 2 (two) times daily.   Yes [provider]  famotidine (PEPCID) 20 MG tablet Take 1 tablet (20 mg total) by mouth 2 (two) times daily. 10/25/20  Yes Gerhard Munch, MD  hydrOXYzine (ATARAX/VISTARIL) 25 MG tablet Take 25 mg by mouth daily as needed for anxiety.   Yes [provider]  lisdexamfetamine (VYVANSE) 60 MG capsule Take 60 mg by mouth every morning.   Yes [provider]  metoCLOPramide (REGLAN) 5 MG tablet Take 5 mg by mouth 3 (three) times daily before meals.   Yes [provider]  naproxen (NAPROSYN) 500 MG tablet Take 1 tablet (500 mg total) by mouth 2 (two) times daily. Take with food 02/24/21  Yes Triplett, Tammy, PA-C  perampanel (FYCOMPA) 2 MG tablet Take 2 mg by mouth daily.   Yes [provider]  polyethylene glycol powder (GLYCOLAX/MIRALAX) 17 GM/SCOOP powder See admin instructions. 07/29/19  Yes [provider]  pregabalin (LYRICA) 150 MG capsule Take 300 mg by mouth 2 (two) times daily.   Yes [provider]  promethazine (PHENERGAN) 25 MG tablet Take 1 tablet by mouth every 6 (six) hours as needed.   Yes [provider]  tiZANidine (ZANAFLEX) 4 MG tablet Take 4 mg by mouth 3 (three) times daily.   Yes [provider]  zolpidem (AMBIEN) 10 MG tablet Take 10 mg by mouth at bedtime.   Yes [provider]  amitriptyline (ELAVIL) 25 MG tablet Take 1 tablet (25 mg total) by mouth at bedtime. Patient not  taking: No sig reported 10/31/18   Melene Plan, DO  amitriptyline (ELAVIL) 25 MG tablet 1 tablet at bedtime. Patient not taking: No sig reported    [provider]  chlorhexidine (PERIDEX) 0.12 % solution Use as directed 15 mLs in the mouth or throat 2 (two) times daily. Patient not taking: No sig reported 10/14/19   Wurst, [place], PA-C  clotrimazole (LOTRIMIN) 1 % cream Apply to affected area 2 times daily Patient not taking: No sig reported 10/14/19   Wurst, [place], PA-C  cyclobenzaprine (FLEXERIL) 10 MG tablet cyclobenzaprine 10 mg tablet  Take 1 tablet twice a day by oral route. Patient not taking: No sig reported    [provider]  DEPAKOTE 500 MG DR tablet Take 500 mg by mouth. Takes 1 in am and 2 in pm Patient not taking: Reported on 04/24/2021 07/03/19   [provider]  doxepin (SINEQUAN) 25 MG capsule Take 25 mg by mouth 3 (three) times daily. Patient not taking: No sig reported 10/01/18   [provider]  doxepin (SINEQUAN) 50 MG capsule 1 capsuleat bedtime as needed for sleep Patient not taking: No sig reported    [provider]  DULoxetine HCl 40 MG CPEP 1 capsule Patient not taking: No sig reported 07/05/19   [provider]  haloperidol (HALDOL) 1 MG tablet haloperidol 1 mg tablet  Take 1 tablet every day by oral route at bedtime. Patient not taking: No sig reported    [provider]  linaclotide (LINZESS) 290 MCG CAPS capsule Take 1 capsule (290 mcg total) by mouth daily before breakfast. Patient not taking: No sig reported 08/12/19   Tiffany Kocher, PA-C  LORazepam (ATIVAN) 1 MG tablet lorazepam 1 mg tablet  Take 1 tablet twice a day by oral route as needed. Patient not taking: No sig reported    [provider]  pregabalin (LYRICA) 200 MG capsule pregabalin 200 mg capsule  Take 1 capsule 3 times a day by oral route for 14 days. Patient not taking: No sig reported    [provider]   QUEtiapine (SEROQUEL) 25 MG tablet 1 tablet at bedtime for depression / anxiety / sleep Patient not taking: No sig reported 07/04/19   [provider]  sucralfate (CARAFATE) 1 g tablet Take 1 tablet (1 g total) by mouth 4 (four) times daily -  with meals and at bedtime. 10/25/20   Gerhard Munch, MD  tiZANidine (ZANAFLEX) 4 MG tablet Take 4 mg by mouth 2 (two) times daily as needed for muscle spasms. Patient not taking: Reported on 04/24/2021    [provider]  zaleplon (SONATA) 10 MG capsule Take 10 mg by mouth at bedtime.  07/29/19   [provider]  diphenhydrAMINE (BENADRYL) 25 mg capsule Take 25 mg by mouth every 6 (six) hours as needed.  10/14/19  [provider]  fluticasone (FLONASE) 50 MCG/ACT nasal spray Place 1 spray into both nostrils daily. Patient not taking: Reported on 03/04/2019 02/10/17 03/13/19  Caccavale, Sophia, PA-C  omeprazole (PRILOSEC) 20 MG capsule Take 1 capsule (20 mg total) by mouth 2 (two) times daily before a meal. Patient not taking: No sig reported 08/12/19 10/25/20  Tiffany Kocher, PA-C    Allergies    Tessalon [benzonatate]  Review of Systems   Review of Systems  Unable to perform ROS: Mental status change  Gastrointestinal:  Negative for abdominal pain.  Neurological:  Positive for weakness.   Physical Exam Updated Vital Signs BP 112/75   Pulse 88   Temp 97.6 F (36.4 C) (Axillary)   Resp (!) 22   Ht 5\' 10"  (1.778 m)   Wt 81.6 kg   SpO2 100%   BMI 25.83 kg/m   Physical Exam Vitals and nursing note reviewed.  Constitutional:      Appearance: He is well-developed.     Comments: Lethargic  HENT:     Head: Normocephalic.     Nose: Nose normal.  Eyes:     General: No scleral icterus.    Conjunctiva/sclera: Conjunctivae normal.     Comments: Pinpoint pupils  Neck:     Thyroid: No thyromegaly.  Cardiovascular:     Rate and Rhythm: Normal rate and regular rhythm.     Heart sounds: No murmur heard.   No  friction rub. No gallop.  Pulmonary:     Breath sounds: No stridor. No wheezing or rales.  Chest:     Chest wall: No tenderness.  Abdominal:     General: There is no distension.     Tenderness: There is no abdominal tenderness. There is no rebound.  Musculoskeletal:        General: Normal range of motion.     Cervical back: Neck supple.  Lymphadenopathy:     Cervical: No cervical adenopathy.  Skin:    Findings: No erythema or rash.  Neurological:     Motor: No abnormal muscle tone.     Coordination: Coordination normal.     Comments: Responding to painful stimuli only    ED Results / Procedures / Treatments   Labs (all labs ordered are listed, but only abnormal results are displayed) Labs Reviewed  CBC WITH DIFFERENTIAL/PLATELET - Abnormal; Notable for the following components:      Result Value   RBC 2.68 (*)    Hemoglobin 6.0 (*)    HCT 22.2 (*)    MCH 22.4 (*)    MCHC 27.0 (*)    RDW 19.8 (*)    All other components within normal limits  COMPREHENSIVE METABOLIC PANEL - Abnormal; Notable for the following components:   Glucose, Bld 104 (*)    Calcium 8.6 (*)    Total Protein 5.8 (*)    Albumin 2.5 (*)    AST 44 (*)    Alkaline Phosphatase 218 (*)    Total Bilirubin 0.2 (*)    All other components within normal limits  VALPROIC ACID LEVEL - Abnormal; Notable for the following components:   Valproic Acid Lvl 30 (*)    All other components within normal limits  CULTURE, BLOOD (SINGLE)  CULTURE, BLOOD (ROUTINE X 2)  CULTURE, BLOOD (ROUTINE X 2)  BRAIN NATRIURETIC PEPTIDE  URINALYSIS, ROUTINE  W REFLEX MICROSCOPIC  RAPID URINE DRUG SCREEN, HOSP PERFORMED  OCCULT BLOOD X 1 CARD TO LAB, STOOL  POC OCCULT BLOOD, ED  TYPE AND SCREEN  PREPARE RBC (CROSSMATCH)  ABO/RH    EKG None  Radiology CT Head Wo Contrast  Result Date: 04/24/2021 CLINICAL DATA:  Rule out cerebral hemorrhage EXAM: CT HEAD WITHOUT CONTRAST TECHNIQUE: Contiguous axial images were obtained from  the base of the skull through the vertex without intravenous contrast. COMPARISON:  CT head 04/24/2020 FINDINGS: Brain: No evidence of acute infarction, hemorrhage, hydrocephalus, extra-axial collection or mass lesion/mass effect. Vascular: Negative for hyperdense vessel Skull: Negative Sinuses/Orbits: Negative Other: None IMPRESSION: Negative CT head Electronically Signed   By: Marlan Palau M.D.   On: 04/24/2021 14:46   DG Chest Port 1 View  Result Date: 04/24/2021 CLINICAL DATA:  Post intubation. EXAM: PORTABLE CHEST 1 VIEW COMPARISON:  February 23, 2019 FINDINGS: Post intubation with endotracheal tube terminating 1.3 cm above the carina. Slight retraction may be considered. Enteric catheter terminates in the region of the gastric antrum. Cardiomediastinal silhouette is normal. Mediastinal contours appear intact. Streaky and patchy airspace opacities with lower lobe predominance are present in both lungs. Osseous structures are without acute abnormality. Soft tissues are grossly normal. IMPRESSION: 1. Post intubation with endotracheal tube terminating 1.3 cm above the carina. Slight retraction may be considered. 2. Streaky and patchy airspace opacities with lower lobe predominance in both lungs. 3. No evidence of pneumothorax. Electronically Signed   By: Ted Mcalpine M.D.   On: 04/24/2021 15:20    Procedures Procedure Name: Intubation Date/Time: 04/24/2021 3:27 PM Performed by: Bethann Berkshire, MD Comments: Patient was intubated with 7-1/2 tube using the glide a scope with the first attempt.  He had copious fluid coming out that was suction.  Placement was confirmed with CO2 return and auscultation and direct visualization of the tube going through the cords.      Medications Ordered in ED Medications  0.9 %  sodium chloride infusion (has no administration in time range)  levETIRAcetam (KEPPRA) IVPB 1000 mg/100 mL premix (1,000 mg Intravenous New Bag/Given 04/24/21 1338)  succinylcholine  (ANECTINE) syringe 120 mg (has no administration in time range)  etomidate (AMIDATE) 2 MG/ML injection (has no administration in time range)  sodium chloride 0.9 % bolus 1,000 mL (has no administration in time range)  ceFEPIme (MAXIPIME) 1 g in sodium chloride 0.9 % 100 mL IVPB (has no administration in time range)  azithromycin (ZITHROMAX) 500 mg in sodium chloride 0.9 % 250 mL IVPB (has no administration in time range)  vancomycin (VANCOREADY) IVPB 1000 mg/200 mL (has no administration in time range)  vancomycin (VANCOREADY) IVPB 1500 mg/300 mL (has no administration in time range)  propofol (DIPRIVAN) 1000 MG/100ML infusion (has no administration in time range)  sodium chloride 0.9 % bolus 500 mL (0 mLs Intravenous Stopped 04/24/21 1331)  naloxone (NARCAN) 2 MG/2ML injection (2 mg  Given 04/24/21 1319)  LORazepam (ATIVAN) injection 1 mg (1 mg Intravenous Given 04/24/21 1331)  sodium chloride 0.9 % bolus 1,000 mL (1,000 mLs Intravenous New Bag/Given 04/24/21 1344)  etomidate (AMIDATE) injection 20 mg (20 mg Intravenous Given by Other 04/24/21 1430)  midazolam (VERSED) 5 MG/5ML injection 2.5 mg (2.5 mg Intravenous Given by Other 04/24/21 1439)    ED Course  I have reviewed the triage vital signs and the nursing notes.  Pertinent labs & imaging results that were available during my care of the patient were reviewed by me and considered  in my medical decision making (see chart for details). CRITICAL CARE Performed by: Bethann Berkshire Total critical care time: 65 minutes Critical care time was exclusive of separately billable procedures and treating other patients. Critical care was necessary to treat or prevent imminent or life-threatening deterioration. Critical care was time spent personally by me on the following activities: development of treatment plan with patient and/or surrogate as well as nursing, discussions with consultants, evaluation of patient's response to treatment, examination of  patient, obtaining history from patient or surrogate, ordering and performing treatments and interventions, ordering and review of laboratory studies, ordering and review of radiographic studies, pulse oximetry and re-evaluation of patient's condition.  When I first evaluated the patient he was lethargic and only responding to painful stimuli.  He had pinpoint pupils.  We gave the patient some Narcan.  He awoke some but still would not answer questions appropriately.  Then he had a seizure.  Patient was given Keppra and Ativan.  His seizure stopped became lethargic but still had a proper gag reflex and would reach up with his hand to pull the tongue blade out.  Then the patient became hypoxic.  He was intubated because of the hypoxia.  I suspect the patient has aspirated.  I spoke with critical care and they agree to accept the patient over [facility] MDM Rules/Calculators/A&P                           Patient with seizure aspiration pneumonia hypoxia and anemia Final Clinical Impression(s) / ED Diagnoses Final diagnoses:  Acute respiratory failure, unspecified whether with hypoxia or hypercapnia [facility])    Rx / DC Orders ED Discharge Orders     None        Bethann Berkshire, MD 04/24/21 1528

## 2021-04-24 NOTE — Progress Notes (Signed)
[facility] Physician-Brief Progress Note Patient Name: Vincent Green DOB: 1996/02/13 MRN: [id number]   Date of Service  04/24/2021  HPI/Events of Note  Hypothermia - Temp = 35.9 F.   eICU Interventions  Plan: Bair Hugger PRN.      Intervention Category Major Interventions: Other:  Vincent Green 04/24/2021, 11:37 PM

## 2021-04-24 NOTE — Progress Notes (Signed)
Pharmacy Antibiotic Note  Vincent Green is a 25 y.o. male admitted on 04/24/2021 with pneumonia.  Pharmacy has been consulted for Vancomycin dosing.  Plan: Vancomycin 1500 mg IV Q 12 hrs. Goal AUC 400-550. Expected AUC: 465 SCr used: 0.91, predicted Css min 11   Height: 5\' 10"  (177.8 cm) Weight: 81.6 kg (180 lb) IBW/kg (Calculated) : 73  Temp (24hrs), Avg:98.3 F (36.8 C), Min:97.6 F (36.4 C), Max:99 F (37.2 C)  Recent Labs  Lab 04/24/21 1156  WBC 6.3  CREATININE 0.91    Estimated Creatinine Clearance: 127 mL/min (by C-G formula based on SCr of 0.91 mg/dL).    Allergies  Allergen Reactions   Tessalon [Benzonatate] Swelling    Throat swelling    Antimicrobials this admission: Vanco 11/6 >>  Cefepime 11/6 >>   Dose adjustments this admission:   Microbiology results:  BCx: pending  UCx: pending   Sputum: pending   MRSA PCR: pending  Thank you for allowing pharmacy to be a part of this patient's care.  13/6 A 04/24/2021 3:09 PM

## 2021-04-24 NOTE — Sepsis Progress Note (Signed)
[facility] continues to follow for Sepsis Protocol, Discussion for need of lactic which has never been ordered for protocol has taken place with elink CCM. Will hold off on lactic due to pending CBC for possible need of further blood transfusion. Current labs being drawn at this time. Elink MD aware of neuro status

## 2021-04-24 NOTE — ED Notes (Addendum)
Pt intubated at 1431 with 7.0 ETT by Dr. Estell Harpin. Secured at 24cm at lip. Positive color change and bilateral breath sounds heard. Orders given to not administer Succinylcholine by Dr. Estell Harpin.

## 2021-04-24 NOTE — Progress Notes (Signed)
[facility] following for sepsis protocol. 

## 2021-04-24 NOTE — ED Notes (Signed)
Critical Lab: Hgb of 6.0.  Provider notified.

## 2021-04-24 NOTE — ED Triage Notes (Signed)
Pt brought in by [facility] from home with c/o swelling to legs and hands, worsening over the last 2 days. Pt reports he has had swelling for 2.5 months. Pt says he was unable to walk this morning due to pain and swelling in his legs, but was able to walk yesterday.

## 2021-04-24 NOTE — ED Notes (Signed)
Beeped to [phone] through [facility]. Critical Care

## 2021-04-24 NOTE — H&P (Signed)
NAME:  Vincent Green, MRN:  [id number], DOB:  1995/10/01, LOS: 0 ADMISSION DATE:  04/24/2021, CONSULTATION DATE:  04/24/2021 REFERRING MD:  Dr. Roderic Palau, CHIEF COMPLAINT:  Hypoxic respiratory failure  History of Present Illness:  HPI obtained from medical chart review as patient is sedated and intubated on mechanical ventilation.   25 year old male with history as below who presented to [facility] ER on 11/6 with complaints of worsening lower leg swelling, worse over the last two days, now unable to walk.   Of note he has been seen in ER for similar symptoms after a fall on 8/20, 9/8, and most recently 9/13.  Found on 9/8 to have new undocumented heart murmur.  LUE XR negative, [place] neg for DVT, and given reports of intermittent fevers, MRI and further labs workup, however patient left AMA prior to getting these.    On today's ER evaluation, temp 99, and he was otherwise hemodynamically stable.  Labs noted for Hgb 6 (previously 9.1 on 9/8), alk phos 218, low protein/ albumin, AST 44, FOBT neg.  Noted to have lower leg swelling with erythema and warmth.  While in ER, patient became altered with pinpoint pupils.  He was given narcan with some improvement, then noted to have witnessed seizure.  He became hypoxic, suspected he aspirated and required intubation for airway protection.  He was sedated on propofol.  Given 1 unit of PRBC.  BC obtained and given vancomycin, cefepime, and azithromax.  Patient transferred to [facility] for further ICU care, PCCM accepting.   Pertinent  Medical History  Anxiety PTSD Seizure (?benzo related) Scoliosis  GERD Microcytic anemia, likely IDA Illicit drug abuse ? murmur  Significant Hospital Events: Including procedures, antibiotic start and stop dates in addition to other pertinent events   11/6 presented AP with LE swelling/ redness.  AMS, pinpoint pupils, responded to narcan but then had witnessed seizure, f/b hypoxia requiring intubation for airway protection,  suspected aspiration.  Cefepime, vanc, azithro.  Interim History / Subjective:  Arrived on propofol gtt, sedated.  Hypoglycemic on arrival s/p D50 Also found with two red capsules in his pocket with SG 355 (pregablin 263m by pill identifier)  Also with > 1L in bladder scan s/p foley placement  Objective   Blood pressure 114/77, pulse 84, temperature 97.6 F (36.4 C), temperature source Axillary, resp. rate (!) 22, height 5' 10"  (1.778 m), weight 81.6 kg, SpO2 100 %.    Vent Mode: PRVC FiO2 (%):  [1 %] 1 % Set Rate:  [22 bmp] 22 bmp Vt Set:  [580 mL] 580 mL PEEP:  [5 cmH20] 5 cmH20 Plateau Pressure:  [17 cmH20] 17 cmH20   Intake/Output Summary (Last 24 hours) at 04/24/2021 1541 Last data filed at 04/24/2021 1331 Gross per 24 hour  Intake 500 ml  Output --  Net 500 ml   Filed Weights   04/24/21 1138  Weight: 81.6 kg   Examination: General:  Chronically ill/ acutely ill pale young adult male sedated on MV HEENT: MM pale/ moist, edentulous, ETT 25 at lip, OGT with bilious return  Neuro: sedated, attempts to open eyes to name, but otherwise does not f/c, minimal response to noxious stimuli CV: rr, NSR PULM:  non labored on MV, coarse throughout, no wheezing, PIP 23, plat 19 GI: soft, bs hypo, s/p foley w/ cyu, ?previous suprapubic cath/ scar Extremities: warm/dry, bilateral LE edema, see pic below, few red patches extending up into inner thighs, healing abrasions to knees  Resolved Hospital Problem list    Assessment & Plan:   Acute hypoxic respiratory insufficiency and likely aspiration pneumonia/ pneumonitis s/p witnessed seizure - Continue MV support, 8cc/kg IBW with goal Pplat <30 and DP<15  - VAP prevention protocol/ PPI - CXR and ABG now - PAD protocol for sedation> propofol, prn fentanyl for RASS goal 0/-1 and absence of seizures - wean FiO2 as able for SpO2 >92%  - daily SAT & SBT when appropriate - send trach asp - s/p cefepime and azithro in ER.  Continue  with unasyn for abx coverage.  Check MRSA PCR.    Seizure- witnessed in ER, hx of , possibly related to benzo withdrawal?  On depakote, ? Med compliance Acute encephalopathy- toxic/ metabolic, initial pinpoint pupils/ responded to narcan, hx of illicit drug use PTSD/ anxiety - check UDS - CTH neg - loaded with keppra and given ativan in ER - continue keppra  - check depakote level -> 30 (low) - spot EEG  - Respiratory support as above - Maintain neuro protective measures; goal for eurothermia, euglycemia, eunatermia, normoxia, and PCO2 goal of 35-40 - Seizure precautions  - prn ativan for seizure - ?at methadone clinic today per bedside report, will need to clarify home meds with family, hold home meds for now  Anemia - hx of microcytic anemia, ?IDA - transfused 1 unit of PRBC at [facility] - will repeat H/H and send Type and screen - prior complaints of hematemesis 10/2020, unclear if he followed up with GI.  Currently OGT return is bilious.  No obvious source of bleeding - will need iron studies but now has been transfused.  F/u outpt  Sepsis  LE cellulitis  - follow BC, UC, and UA pending  - continue with unasyn and vanc for now - remains hemodynamically stable  - previous LE dupplex 03/01/21 neg for DVT  Hypoglycemia - unclear etiology, stress/ sepsis response?, has been NPO,  - start TF  Urinary retention s/p foley - likely due to encephalopathy/ sedation  - continue foley - monitor renal indices - strict I/Os  Best Practice (right click and "Reselect all SmartList Selections" daily)   Diet/type: tubefeeds DVT prophylaxis: prophylactic heparin  GI prophylaxis: H2B Lines: N/A Foley:  Yes, and it is still needed Code Status:  full code Last date of multidisciplinary goals of care discussion [pending]  Labs   CBC: Recent Labs  Lab 04/24/21 1156  WBC 6.3  NEUTROABS 5.0  HGB 6.0*  HCT 22.2*  MCV 82.8  PLT 937    Basic Metabolic Panel: Recent Labs  Lab  04/24/21 1156  NA 137  K 3.6  CL 104  CO2 26  GLUCOSE 104*  BUN 12  CREATININE 0.91  CALCIUM 8.6*   GFR: Estimated Creatinine Clearance: 127 mL/min (by C-G formula based on SCr of 0.91 mg/dL). Recent Labs  Lab 04/24/21 1156  WBC 6.3    Liver Function Tests: Recent Labs  Lab 04/24/21 1156  AST 44*  ALT 21  ALKPHOS 218*  BILITOT 0.2*  PROT 5.8*  ALBUMIN 2.5*   No results for input(s): LIPASE, AMYLASE in the last 168 hours. No results for input(s): AMMONIA in the last 168 hours.  ABG No results found for: PHART, PCO2ART, PO2ART, HCO3, TCO2, ACIDBASEDEF, O2SAT   Coagulation Profile: No results for input(s): INR, PROTIME in the last 168 hours.  Cardiac Enzymes: No results for input(s): CKTOTAL, CKMB, CKMBINDEX, TROPONINI in the last 168 hours.  HbA1C: No results found for: HGBA1C  CBG: No results for input(s): GLUCAP in the last 168 hours.  Review of Systems:   Unable   Past Medical History:  He,  has a past medical history of ADHD, Anxiety, Anxiety, Chronic dental pain, COPD (chronic obstructive pulmonary disease) ([place]), Insomnia, PTSD (post-traumatic stress disorder), PTSD (post-traumatic stress disorder), Scoliosis, Seizures ([place]), and Tachycardia.   Surgical History:   Past Surgical History:  Procedure Laterality Date   CHOANAL ATRESIA REPAIR     NOSE SURGERY     SKIN GRAFT       Social History:   reports that he has been smoking cigarettes. He has been smoking an average of .5 packs per day. He has never used smokeless tobacco. He reports that he does not currently use drugs after having used the following drugs: Benzodiazepines and Marijuana. He reports that he does not drink alcohol.   Family History:  His family history includes GER disease in his paternal grandmother; Other (age of onset: 45) in his mother. There is no history of Colon cancer.   Allergies Allergies  Allergen Reactions   Tessalon [Benzonatate] Swelling    Throat swelling      Home Medications  Prior to Admission medications   Medication Sig Start Date End Date Taking? Authorizing Provider  albuterol (PROVENTIL HFA;VENTOLIN HFA) 108 (90 Base) MCG/ACT inhaler Inhale 1-2 puffs into the lungs every 6 (six) hours as needed for wheezing or shortness of breath. 02/10/17  Yes Oxford, Orson Ape, FNP  diazepam (VALIUM) 5 MG tablet Take 5 mg by mouth every 12 (twelve) hours as needed for anxiety or muscle spasms.   Yes [provider]  divalproex (DEPAKOTE SPRINKLE) 125 MG capsule Take 250 mg by mouth 2 (two) times daily.   Yes [provider]  famotidine (PEPCID) 20 MG tablet Take 1 tablet (20 mg total) by mouth 2 (two) times daily. 10/25/20  Yes Carmin Muskrat, MD  hydrOXYzine (ATARAX/VISTARIL) 25 MG tablet Take 25 mg by mouth daily as needed for anxiety.   Yes [provider]  lisdexamfetamine (VYVANSE) 60 MG capsule Take 60 mg by mouth every morning.   Yes [provider]  metoCLOPramide (REGLAN) 5 MG tablet Take 5 mg by mouth 3 (three) times daily before meals.   Yes [provider]  naproxen (NAPROSYN) 500 MG tablet Take 1 tablet (500 mg total) by mouth 2 (two) times daily. Take with food 02/24/21  Yes Triplett, Tammy, PA-C  perampanel (FYCOMPA) 2 MG tablet Take 2 mg by mouth daily.   Yes [provider]  polyethylene glycol powder (GLYCOLAX/MIRALAX) 17 GM/SCOOP powder See admin instructions. 07/29/19  Yes [provider]  pregabalin (LYRICA) 150 MG capsule Take 300 mg by mouth 2 (two) times daily.   Yes [provider]  promethazine (PHENERGAN) 25 MG tablet Take 1 tablet by mouth every 6 (six) hours as needed.   Yes [provider]  tiZANidine (ZANAFLEX) 4 MG tablet Take 4 mg by mouth 3 (three) times daily.   Yes [provider]  zolpidem (AMBIEN) 10 MG tablet Take 10 mg by mouth at bedtime.   Yes [provider]  amitriptyline (ELAVIL) 25 MG tablet Take 1 tablet (25 mg total)  by mouth at bedtime. Patient not taking: No sig reported 10/31/18   Deno Etienne, DO  amitriptyline (ELAVIL) 25 MG tablet 1 tablet at bedtime. Patient not taking: No sig reported    [provider]  chlorhexidine (PERIDEX) 0.12 % solution Use as directed 15 mLs in  the mouth or throat 2 (two) times daily. Patient not taking: No sig reported 10/14/19   Wurst, [place], PA-C  clotrimazole (LOTRIMIN) 1 % cream Apply to affected area 2 times daily Patient not taking: No sig reported 10/14/19   Wurst, [place], PA-C  cyclobenzaprine (FLEXERIL) 10 MG tablet cyclobenzaprine 10 mg tablet  Take 1 tablet twice a day by oral route. Patient not taking: No sig reported    [provider]  DEPAKOTE 500 MG DR tablet Take 500 mg by mouth. Takes 1 in am and 2 in pm Patient not taking: Reported on 04/24/2021 07/03/19   [provider]  doxepin (SINEQUAN) 25 MG capsule Take 25 mg by mouth 3 (three) times daily. Patient not taking: No sig reported 10/01/18   [provider]  doxepin (SINEQUAN) 50 MG capsule 1 capsuleat bedtime as needed for sleep Patient not taking: No sig reported    [provider]  DULoxetine HCl 40 MG CPEP 1 capsule Patient not taking: No sig reported 07/05/19   [provider]  haloperidol (HALDOL) 1 MG tablet haloperidol 1 mg tablet  Take 1 tablet every day by oral route at bedtime. Patient not taking: No sig reported    [provider]  linaclotide (LINZESS) 290 MCG CAPS capsule Take 1 capsule (290 mcg total) by mouth daily before breakfast. Patient not taking: No sig reported 08/12/19   Mahala Menghini, PA-C  LORazepam (ATIVAN) 1 MG tablet lorazepam 1 mg tablet  Take 1 tablet twice a day by oral route as needed. Patient not taking: No sig reported    [provider]  pregabalin (LYRICA) 200 MG capsule pregabalin 200 mg capsule  Take 1 capsule 3 times a day by oral route for 14 days. Patient not taking: No sig reported     [provider]  QUEtiapine (SEROQUEL) 25 MG tablet 1 tablet at bedtime for depression / anxiety / sleep Patient not taking: No sig reported 07/04/19   [provider]  sucralfate (CARAFATE) 1 g tablet Take 1 tablet (1 g total) by mouth 4 (four) times daily -  with meals and at bedtime. 10/25/20   Carmin Muskrat, MD  tiZANidine (ZANAFLEX) 4 MG tablet Take 4 mg by mouth 2 (two) times daily as needed for muscle spasms. Patient not taking: Reported on 04/24/2021    [provider]  zaleplon (SONATA) 10 MG capsule Take 10 mg by mouth at bedtime. 07/29/19   [provider]  diphenhydrAMINE (BENADRYL) 25 mg capsule Take 25 mg by mouth every 6 (six) hours as needed.  10/14/19  [provider]  fluticasone (FLONASE) 50 MCG/ACT nasal spray Place 1 spray into both nostrils daily. Patient not taking: Reported on 03/04/2019 02/10/17 03/13/19  Caccavale, Sophia, PA-C  omeprazole (PRILOSEC) 20 MG capsule Take 1 capsule (20 mg total) by mouth 2 (two) times daily before a meal. Patient not taking: No sig reported 08/12/19 10/25/20  Mahala Menghini, PA-C     Critical care time: 67 mins       Kennieth Rad, ACNP [facility] 04/24/2021, 7:11 PM  See Amion for pager If no response to pager, please call PCCM consult pager After 7:00 pm call Elink

## 2021-04-24 NOTE — Progress Notes (Signed)
Respiratory culture collected, sent to lab.  

## 2021-04-25 ENCOUNTER — Inpatient Hospital Stay (HOSPITAL_COMMUNITY): Payer: Medicaid Other

## 2021-04-25 DIAGNOSIS — G40909 Epilepsy, unspecified, not intractable, without status epilepticus: Secondary | ICD-10-CM

## 2021-04-25 DIAGNOSIS — G9341 Metabolic encephalopathy: Secondary | ICD-10-CM | POA: Diagnosis not present

## 2021-04-25 DIAGNOSIS — R569 Unspecified convulsions: Secondary | ICD-10-CM

## 2021-04-25 DIAGNOSIS — R011 Cardiac murmur, unspecified: Secondary | ICD-10-CM

## 2021-04-25 DIAGNOSIS — F4310 Post-traumatic stress disorder, unspecified: Secondary | ICD-10-CM | POA: Diagnosis not present

## 2021-04-25 DIAGNOSIS — J9601 Acute respiratory failure with hypoxia: Secondary | ICD-10-CM | POA: Diagnosis not present

## 2021-04-25 LAB — BASIC METABOLIC PANEL
Anion gap: 5 (ref 5–15)
BUN: 8 mg/dL (ref 6–20)
CO2: 22 mmol/L (ref 22–32)
Calcium: 7.5 mg/dL — ABNORMAL LOW (ref 8.9–10.3)
Chloride: 110 mmol/L (ref 98–111)
Creatinine, Ser: 0.79 mg/dL (ref 0.61–1.24)
GFR, Estimated: >60 mL/min (ref >60)
Glucose, Bld: 99 mg/dL (ref 70–99)
Potassium: 3.4 mmol/L — ABNORMAL LOW (ref 3.5–5.1)
Sodium: 137 mmol/L (ref 135–145)

## 2021-04-25 LAB — BPAM RBC
Blood Product Expiration Date: 202212032359
Blood Product Expiration Date: 202212032359
ISSUE DATE / TIME: 202211061400
ISSUE DATE / TIME: 202211061500
Unit Type and Rh: 5100
Unit Type and Rh: 5100

## 2021-04-25 LAB — GLUCOSE, CAPILLARY
Glucose-Capillary: 103 mg/dL — ABNORMAL HIGH (ref 70–99)
Glucose-Capillary: 121 mg/dL — ABNORMAL HIGH (ref 70–99)
Glucose-Capillary: 49 mg/dL — ABNORMAL LOW (ref 70–99)
Glucose-Capillary: 53 mg/dL — ABNORMAL LOW (ref 70–99)
Glucose-Capillary: 80 mg/dL (ref 70–99)
Glucose-Capillary: 87 mg/dL (ref 70–99)
Glucose-Capillary: 91 mg/dL (ref 70–99)
Glucose-Capillary: 98 mg/dL (ref 70–99)

## 2021-04-25 LAB — POCT I-STAT 7, (LYTES, BLD GAS, ICA,H+H)
Acid-base deficit: 1 mmol/L (ref 0.0–2.0)
Bicarbonate: 23.2 mmol/L (ref 20.0–28.0)
Calcium, Ion: 1.21 mmol/L (ref 1.15–1.40)
HCT: 19 % — ABNORMAL LOW (ref 39.0–52.0)
Hemoglobin: 6.5 g/dL — CL (ref 13.0–17.0)
O2 Saturation: 100 %
Patient temperature: 37.5
Potassium: 3.3 mmol/L — ABNORMAL LOW (ref 3.5–5.1)
Sodium: 139 mmol/L (ref 135–145)
TCO2: 24 mmol/L (ref 22–32)
pCO2 arterial: 36 mmHg (ref 32.0–48.0)
pH, Arterial: 7.42 (ref 7.350–7.450)
pO2, Arterial: 172 mmHg — ABNORMAL HIGH (ref 83.0–108.0)

## 2021-04-25 LAB — CBC
HCT: 23.7 % — ABNORMAL LOW (ref 39.0–52.0)
Hemoglobin: 7.1 g/dL — ABNORMAL LOW (ref 13.0–17.0)
MCH: 24.1 pg — ABNORMAL LOW (ref 26.0–34.0)
MCHC: 30 g/dL (ref 30.0–36.0)
MCV: 80.6 fL (ref 80.0–100.0)
Platelets: 253 10*3/uL (ref 150–400)
RBC: 2.94 MIL/uL — ABNORMAL LOW (ref 4.22–5.81)
RDW: 18.5 % — ABNORMAL HIGH (ref 11.5–15.5)
WBC: 9.7 10*3/uL (ref 4.0–10.5)
nRBC: 0 % (ref 0.0–0.2)

## 2021-04-25 LAB — TYPE AND SCREEN
ABO/RH(D): O POS
Antibody Screen: NEGATIVE
Unit division: 0
Unit division: 0

## 2021-04-25 LAB — ECHOCARDIOGRAM COMPLETE
Area-P 1/2: 4.1 cm2
Height: 70 in
S' Lateral: 2.34 cm
Weight: 2522.06 oz

## 2021-04-25 LAB — LIPASE, BLOOD: Lipase: 23 U/L (ref 11–51)

## 2021-04-25 LAB — PREPARE RBC (CROSSMATCH)

## 2021-04-25 LAB — PHOSPHORUS: Phosphorus: 3 mg/dL (ref 2.5–4.6)

## 2021-04-25 LAB — MAGNESIUM: Magnesium: 1.9 mg/dL (ref 1.7–2.4)

## 2021-04-25 LAB — HEMOGLOBIN AND HEMATOCRIT, BLOOD
HCT: 25.9 % — ABNORMAL LOW (ref 39.0–52.0)
Hemoglobin: 7.7 g/dL — ABNORMAL LOW (ref 13.0–17.0)

## 2021-04-25 MED ORDER — LACTATED RINGERS IV BOLUS
1000.0000 mL | Freq: Once | INTRAVENOUS | Status: AC
  Administered 2021-04-25: 1000 mL via INTRAVENOUS

## 2021-04-25 MED ORDER — SODIUM CHLORIDE 0.9 % IV SOLN
1.0000 g | INTRAVENOUS | Status: DC
  Administered 2021-04-25 – 2021-04-26 (×2): 1 g via INTRAVENOUS
  Filled 2021-04-25 (×2): qty 10

## 2021-04-25 MED ORDER — CHLORHEXIDINE GLUCONATE 0.12% ORAL RINSE (MEDLINE KIT)
15.0000 mL | Freq: Two times a day (BID) | OROMUCOSAL | Status: DC
  Administered 2021-04-25 – 2021-05-03 (×17): 15 mL via OROMUCOSAL

## 2021-04-25 MED ORDER — DIAZEPAM 5 MG PO TABS
2.5000 mg | ORAL_TABLET | Freq: Once | ORAL | Status: AC
  Administered 2021-04-25: 2.5 mg
  Filled 2021-04-25: qty 1

## 2021-04-25 MED ORDER — CHLORHEXIDINE GLUCONATE CLOTH 2 % EX PADS
6.0000 | MEDICATED_PAD | Freq: Every day | CUTANEOUS | Status: DC
Start: 2021-04-25 — End: 2021-04-27
  Administered 2021-04-25 – 2021-04-26 (×2): 6 via TOPICAL

## 2021-04-25 MED ORDER — DEXTROSE 10 % IV SOLN: INTRAVENOUS | Status: AC

## 2021-04-25 MED ORDER — POTASSIUM CHLORIDE 10 MEQ/100ML IV SOLN
10.0000 meq | INTRAVENOUS | Status: AC
  Administered 2021-04-25 (×4): 10 meq via INTRAVENOUS
  Filled 2021-04-25 (×3): qty 100

## 2021-04-25 MED ORDER — PERFLUTREN LIPID MICROSPHERE
1.0000 mL | INTRAVENOUS | Status: AC | PRN
  Administered 2021-04-25: 2 mL via INTRAVENOUS
  Filled 2021-04-25: qty 10

## 2021-04-25 MED ORDER — POTASSIUM CHLORIDE 20 MEQ PO PACK
20.0000 meq | PACK | ORAL | Status: AC
  Administered 2021-04-25 (×2): 20 meq
  Filled 2021-04-25 (×2): qty 1

## 2021-04-25 MED ORDER — VITAL AF 1.2 CAL PO LIQD
1000.0000 mL | ORAL | Status: DC
  Administered 2021-04-25: 1000 mL

## 2021-04-25 MED ORDER — ORAL CARE MOUTH RINSE
15.0000 mL | OROMUCOSAL | Status: DC
  Administered 2021-04-25 – 2021-05-01 (×49): 15 mL via OROMUCOSAL

## 2021-04-25 MED ORDER — ADULT MULTIVITAMIN W/MINERALS CH
1.0000 | ORAL_TABLET | Freq: Every day | ORAL | Status: DC
  Administered 2021-04-25 – 2021-04-26 (×2): 1
  Filled 2021-04-25: qty 1

## 2021-04-25 MED ORDER — DEXTROSE 50 % IV SOLN
INTRAVENOUS | Status: AC
  Administered 2021-04-25: 50 mL
  Filled 2021-04-25: qty 50

## 2021-04-25 MED ORDER — MAGNESIUM SULFATE 2 GM/50ML IV SOLN
2.0000 g | Freq: Once | INTRAVENOUS | Status: AC
  Administered 2021-04-25: 2 g via INTRAVENOUS
  Filled 2021-04-25: qty 50

## 2021-04-25 MED ORDER — SODIUM CHLORIDE 0.9% IV SOLUTION: Freq: Once | INTRAVENOUS | Status: AC

## 2021-04-25 MED ORDER — PERAMPANEL 2 MG PO TABS
4.0000 mg | ORAL_TABLET | Freq: Every day | ORAL | Status: DC
  Administered 2021-04-25: 4 mg
  Filled 2021-04-25: qty 2

## 2021-04-25 MED ORDER — VALPROIC ACID 250 MG/5ML PO SOLN
250.0000 mg | Freq: Two times a day (BID) | ORAL | Status: DC
  Administered 2021-04-25 – 2021-04-26 (×2): 250 mg
  Filled 2021-04-25: qty 5

## 2021-04-25 NOTE — Progress Notes (Signed)
  Echocardiogram 2D Echocardiogram has been performed.  Pieter Partridge 04/25/2021, 10:29 AM

## 2021-04-25 NOTE — Procedures (Signed)
Patient Name: Vincent Green  MRN: [id number]  Epilepsy Attending: Charlsie Quest  Referring Physician/Provider: Dr Briant Sites Date: 04/25/2021 Duration: 24.40 mins  Patient history: 25yo M with h/o epilepsy presented with breakthrough seizure. EEG to evaluate for seizure  Level of alertness: Awake  AEDs during EEG study: perampanel, VPA, Ativan  Technical aspects: This EEG study was done with scalp electrodes positioned according to the 10-20 International system of electrode placement. Electrical activity was acquired at a sampling rate of 500Hz  and reviewed with a high frequency filter of 70Hz  and a low frequency filter of 1Hz . EEG data were recorded continuously and digitally stored.   Description: The posterior dominant rhythm consists of 9-10 Hz activity of moderate voltage (25-35 uV) seen predominantly in posterior head regions, symmetric and reactive to eye opening and eye closing.   IMPRESSION: This study is within normal limits. No seizures or epileptiform discharges were seen throughout the recording.  [name] 

## 2021-04-25 NOTE — Progress Notes (Signed)
eLink Physician-Brief Progress Note Patient Name: Vincent Green DOB: [birth year] MRN: [id number]   Date of Service  04/25/2021  HPI/Events of Note  Hypotension - BP = 88/55 with MAP = 65. Last Hgb = 8.1.  eICU Interventions  Plan: Bolus with LR 1 liter IV over 1 hour now. Send AM labs now.      Intervention Category Major Interventions: Hypotension - evaluation and management  Analeigha Nauman Eugene 04/25/2021, 3:16 AM

## 2021-04-25 NOTE — Plan of Care (Signed)
In further discussion with with Link Snuffer, [facility], given patient is still sleepy and far from baseline, will recommend MRI brain and routine EEG to confirm not acute process and continue home medications per medication reconciliation which is ongoing and has been somewhat challenging.   To the best of pharmacy ability to determine at this time, home AEDs are:  250 mg BID Depakote  2 - 4 mg of Fycompa daily (pending confirmation with family) Hold Vyvanse (apparently on home med list), explaining positive UDS  Full recs to follow full eval  Brooke Dare MD-PhD [facility] [phone] Available 7 AM to 7 PM, outside these hours please contact Neurologist on call listed on AMION

## 2021-04-25 NOTE — Progress Notes (Addendum)
NAME:  Vincent Green, MRN:  [id number], DOB:  08-Feb-1996, LOS: 1 ADMISSION DATE:  04/24/2021, CONSULTATION DATE:  04/24/2021 REFERRING MD:  Dr Roderic Palau, CHIEF COMPLAINT:  Hypoxic respiratory failure   History of Present Illness:  Vincent Green is a 25yrold male with PMHx of PTSD, depression/anxiety, seizure disorder, microcytic anemia, and polysubstance use with ?murmur who [facility] with worsening lower extremity swelling and pain for two days. Similar symptoms after a fall on 8/20, 9/8 and 9/13. Found to have undocumented heart murmur on 9/8. LLE XR negative, [place] for DVT and given reports of intermittent fevers, MRI and further labs work up, however patient left AMA prior to getting these.  On ER evaluation 11/6, temp 99. Labs with Hb 6 (previously 9.1 on 9/8), alk phos 218, low protein/abumin, AST 44. FOBT negative. Noted to have LE swelling with erythema and warmth. While in ER, patient became altered with pinpoint pupils for which given Narcan with improvement. However, noted to have witnessed seizure - becoming hypoxic, suspect aspiration requiring intubation for airway protection. Sedated on propofol and given 1u pRBC. Given vancomycin, cefepime and azithromycin. [facility]   Pertinent  Medical History   Past Medical History:  Diagnosis Date   ADHD    Anxiety    Anxiety    Chronic dental pain    COPD (chronic obstructive pulmonary disease) (HCC)    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 (documented 08/12/19)   Tachycardia    Significant Hospital Events: Including procedures, antibiotic start and stop dates in addition to other pertinent events   11/6 > presented with AP with LE swelling/redness. AMS, pinpoint pupils, responded to Narcan but then had witnessed seizure; f/b  hypoxia requiring intubation for airway protection, suspected aspiration. Cefepime, vanc, azithromycin   Interim History / Subjective:  Overnight, noted to be hypothermic for which bair hugger placed. Hypotensive for which given 1L LR with improvement. Hb 6.5 - given 1u pRBC.  This morning, patient continued on mechanical ventilation, FiO2 40%, PEEP 5. He is more awake and alert at this time. Will trial SBT  Objective   Blood pressure 94/64, pulse 83, temperature 98.5 F (36.9 C), temperature source Axillary, resp. rate 12, height 5' 10"  (1.778 m), weight 71.5 kg, SpO2 100 %.    Vent Mode: CPAP;PSV FiO2 (%):  [1 %-100 %] 40 % Set Rate:  [14 bmp-22 bmp] 14 bmp Vt Set:  [580 mL] 580 mL PEEP:  [5 cmH20] 5 cmH20 Pressure Support:  [10 cmH20] 10 cmH20 Plateau Pressure:  [15 cmH20-19 cmH20] 15 cmH20   Intake/Output Summary (Last 24 hours) at 04/25/2021 03875Last data filed at 04/25/2021 0800 Gross per 24 hour  Intake 3654.08 ml  Output 1315 ml  Net 2339.08 ml   Filed Weights   04/24/21 1138 04/25/21 0500  Weight: 81.6 kg 71.5 kg    Examination: General: acute ill appearing young male, endotracheally intubated HENT: /AT, EOMI, PERRL, intubated Lungs: diminished at bilateral bases; otherwise clear to auscultation Cardiovascular: RRR, S1 and S2 present, no m/r/g Abdomen: soft, nondistended, +BS Extremities: bilat lower extremities with nonpitting edema and erythema Neuro: awake and alert; no apparent focal deficits noted Skin: bilat lower extremities with erythema to bilat shin  Resolved Hospital Problem list    Assessment &  Plan:  Acute hypoxic respiratory failure 2/2 aspiration pneumonitis S/p cefepime and azithromycin. Currently on vancomycin. Respiratory culture with rare gram positive cocci. Continued on full vent support at this time.  - Continue full vent support; SBT trial as tolerated - F/u respiratory culture - VAP prevention protocol/ continue H2B - possible  extubation later today if tolerating SBT   Acute metabolic encephalopathy in setting of polysubstance use  Seizure disorder  Initially pinpoint pupils for which received one dose of Narcan on admission with adequate response. UDS +ve for benzos, amphetamines and THC. Hx of seizures for which on depakote and perampanel at home. Reports med adherence although depakote levels low on admission. Discussed with Dr Curly Sh[place] - likely that his polysubstance use may have triggered seizure. No further seizure-like activity noted. Currently off sedation  - Continue IV Keppra q12h - Ativan prn for breakthrough seizures - Resume home AED's once extubated and tolerating PO intake   BLE cellulitis - POA BLE edema and erythema to mid tibia noted on exam. This has been present since September 2022; bilat DVT US negative x2. Exam most consistent with cellulitis.  - Continue IV vancomycin  - F/u blood cultures   Acute on chronic microcytic anemia  Presented with Hb 6 (baseline ~9). Has history of microcytic anemia; possibly IDA. Transfused 2u pRBC. FOBT negative. No other obvious signs of bleeding at this time. - Trend CBC - Transfuse to maintain Hb >7    Hypothermia, hypoglycemia  Overnight, noted to be hypothermic to 96.68F, requiring bair hugger with improvement. One episode of hypoglycemia to 48 despite tube feeds. May have component of adrenal insufficiency in setting of acute illness.  - Supportive care - If persistent, will start high dose steroid therapy   Hypokalemia:  -replace  Best Practice (right click and "Reselect all SmartList Selections" daily)   Diet/type: tubefeeds DVT prophylaxis: prophylactic heparin  GI prophylaxis: H2B Lines: N/A Foley:  N/A Code Status:  full code Last date of multidisciplinary goals of care discussion [pending]  Labs   CBC: Recent Labs  Lab 04/24/21 1156 04/24/21 2015 04/24/21 2257 04/25/21 0429 04/25/21 0442  WBC 6.3  --  13.0* 9.7  --   NEUTROABS 5.0   --   --   --   --   HGB 6.0* 7.5* 8.1* 7.1* 6.5*  HCT 22.2* 22.0* 27.3* 23.7* 19.0*  MCV 82.8  --  81.0 80.6  --   PLT 273  --  263 253  --     Basic Metabolic Panel: Recent Labs  Lab 04/24/21 1156 04/24/21 1949 04/24/21 2015 04/25/21 0429 04/25/21 0442  NA 137  --  139 137 139  K 3.6  --  3.1* 3.4* 3.3*  CL 104  --   --  110  --   CO2 26  --   --  22  --   GLUCOSE 104*  --   --  99  --   BUN 12  --   --  8  --   CREATININE 0.91  --   --  0.79  --   CALCIUM 8.6*  --   --  7.5*  --   MG  --  1.6*  --  1.9  --   PHOS  --  2.3*  --  3.0  --    GFR: Estimated Creatinine Clearance: 141.5 mL/min (by C-G formula based on SCr of 0.79 mg/dL). Recent Labs  Lab 04/24/21 1156 04/24/21 2257 04/25/21 0429  WBC 6.3 13.0*  9.7    Liver Function Tests: Recent Labs  Lab 04/24/21 1156  AST 44*  ALT 21  ALKPHOS 218*  BILITOT 0.2*  PROT 5.8*  ALBUMIN 2.5*   No results for input(s): LIPASE, AMYLASE in the last 168 hours. No results for input(s): AMMONIA in the last 168 hours.  ABG    Component Value Date/Time   PHART 7.420 04/25/2021 0442   PCO2ART 36.0 04/25/2021 0442   PO2ART 172 (H) 04/25/2021 0442   HCO3 23.2 04/25/2021 0442   TCO2 24 04/25/2021 0442   ACIDBASEDEF 1.0 04/25/2021 0442   O2SAT 100.0 04/25/2021 0442     Coagulation Profile: No results for input(s): INR, PROTIME in the last 168 hours.  Cardiac Enzymes: No results for input(s): CKTOTAL, CKMB, CKMBINDEX, TROPONINI in the last 168 hours.  HbA1C: No results found for: HGBA1C  CBG: Recent Labs  Lab 04/24/21 1846 04/24/21 1943 04/24/21 2322 04/25/21 0341 04/25/21 0724  GLUCAP 124* 102* 77 87 98   Critical care time: 35 minutes     Harvie Heck, MD Internal Medicine, PGY-3 04/25/21 8:22 AM Pager # [phone]

## 2021-04-25 NOTE — Progress Notes (Signed)
EEG complete - results pending 

## 2021-04-25 NOTE — Consult Note (Signed)
Neurology Consultation  Reason for Consult: Seizure Referring Physician: Dr. Ruthann Cancer  CC: Patient complains of ongoing leg pain and penile pain   History is obtained from: Chart review, Patient is able to provide minimal information due to intubation  HPI: Vincent Green is a 25 y.o. male with a medical history significant for PTSD, COPD, depression, anxiety, seizure disorder, microcytic amenia, and polysubstance use who presented to [facility] ED 11/6 for evaluation of 2 days of lower extremity pain and swelling with 3 visits for similar symptoms in the past 2 months with negative xray imaging and negative ultrasound imaging for DVT. He returned to the ED via EMS 11/6 for inability to walk and chart review indicates that he was significantly intoxicated during his evaluation with lethargy, pinpoint pupils, and decreased responsiveness with response only to painful stimuli. He had minimal improvement following Narcan administration but was not answering questions appropriately. Following Narcan administration, patient had emesis with aspiration event and possible seizure and was given Keppra and Ativan with subsequent hypoxia requiring intubation. In the ED his work up revealed patient to be anemic with a hemoglobin of 6 (previously 9.1 on 9/8) with a negative FOBT, valproic acid level subtherpeutic at 30 with patient reported compliance to his medications, and he was diagnosed with lower extremity cellulitis with hypothermia noted on admission to [facility] requiring a bair hugger, and recurrent hypoglycemic events. He was started on vancomycin, cefepime, and azithromycin and transferred to [facility] for further evaluation and management. Due to persistent lethargy from baseline, neurology was consulted for further evaluation.   Additionally on attending evaluation he is able to explain that his Depakote is being tapered to [facility] and that he missed doses of Fycompa prior to admission, and that he takes  Valium for his PTSD at a dose of 10 mg twice daily  Regarding patient's seizure history per most recent neurology report available to me (04/10/2019, Dr. Melrose Nakayama in the [place] system):  "Patient states that he has been having seizures for about 4 years ago he had one seizure that was caused by missing his benzodiazepines. No seizures since then until about 2 months ago. No known cause for these recent seizures. Patient is unaware of what happens during seizures. Per patient father the jerking last about 10-15 seconds. Patient just drops to the ground and starts convulsing. Vomits at times. Positive for tongue biting and incontinence. Taking lyrica 300 mg twice a day for 8-9 months for seizures. Taking Depakote 500 mg twice a day for about 2 months ago. History of PTSD and severe anxiety. No family history of seizures."  On PDMP review it does appear that he he has filled the following recently Filled  Written  Sold  ID  Drug  QTY  Days  Prescriber  RX #  Dispenser  Refill  Daily Dose*  Pymt Type  PMP    04/23/2021  04/12/2021   1  Pregabalin 200 Mg Capsule 42.00  14  Ay Pow  [id number]   Lay ([id number])  1/2  4.02 LME  Comm Ins  [place]    04/20/2021  04/12/2021   1  Vyvanse 60 Mg Capsule 30.00  30  Ay Pow  [id number]   Lay ([id number])  0/0   Comm Ins  [place]    04/20/2021  04/12/2021   1  Diazepam 10 Mg Tablet 60.00  30  Ay Pow  [id number]   Lay ([id number])  0/1  2.00 LME  Comm Ins  [place]  04/20/2021  04/12/2021   1  Zolpidem Tartrate 10 Mg Tablet 30.00  30  Ay Pow  [id number]   Lay ([id number])  0/5  0.50 LME  Comm Ins  [place]   He additionally filled the diazepam in October as well at the same dose  ROS: A complete ROS was performed and is negative except as noted in the HPI.   Past Medical History:  Diagnosis Date   ADHD    Anxiety    Anxiety    Chronic dental pain    COPD (chronic obstructive pulmonary disease) (HCC)    Insomnia    PTSD (post-traumatic stress disorder)    PTSD (post-traumatic stress disorder)     Scoliosis    Seizures (HCC)    benzo-seizure. last seizure 01/2019 per pt (documented 08/12/19)   Tachycardia    Past Surgical History:  Procedure Laterality Date   CHOANAL ATRESIA REPAIR     NOSE SURGERY     SKIN GRAFT      Family History  Problem Relation Age of Onset   Other Mother 14       04/2019 autopsy pending   GER disease Paternal Grandmother    Colon cancer Neg Hx    Social History:   reports that he has been smoking cigarettes. He has been smoking an average of .5 packs per day. He has never used smokeless tobacco. He reports that he does not currently use drugs after having used the following drugs: Benzodiazepines and Marijuana. He reports that he does not drink alcohol.  Medications  Current Facility-Administered Medications:    0.9 %  sodium chloride infusion (Manually program via Guardrails IV Fluids), , Intravenous, Once, Jennelle Human B, NP   cefTRIAXone (ROCEPHIN) 1 g in sodium chloride 0.9 % 100 mL IVPB, 1 g, Intravenous, Q24H, Aslam, Sadia, MD, Stopped at 04/25/21 1634   chlorhexidine gluconate (MEDLINE KIT) (PERIDEX) 0.12 % solution 15 mL, 15 mL, Mouth Rinse, BID, Spero Geralds, MD, 15 mL at 04/25/21 0751   Chlorhexidine Gluconate Cloth 2 % PADS 6 each, 6 each, Topical, Q0600, Spero Geralds, MD, 6 each at 04/25/21 0147   dextrose 10 % infusion, , Intravenous, Continuous, Aslam, Sadia, MD, Last Rate: 75 mL/hr at 04/25/21 1700, Infusion Verify at 04/25/21 1700   docusate sodium (COLACE) capsule 100 mg, 100 mg, Oral, BID PRN, Spero Geralds, MD   famotidine (PEPCID) 40 MG/5ML suspension 20 mg, 20 mg, Per Tube, BID, Spero Geralds, MD, 20 mg at 04/25/21 1026   feeding supplement (VITAL HIGH PROTEIN) liquid 1,000 mL, 1,000 mL, Per Tube, Q24H, Simpson, Paula B, NP, 1,000 mL at 04/24/21 2016   fentaNYL (SUBLIMAZE) injection 50 mcg, 50 mcg, Intravenous, Q15 min PRN, Jennelle Human B, NP, 50 mcg at 04/25/21 0501   fentaNYL (SUBLIMAZE) injection 50-200 mcg, 50-200  mcg, Intravenous, Q30 min PRN, Jennelle Human B, NP, 100 mcg at 04/25/21 1642   heparin injection 5,000 Units, 5,000 Units, Subcutaneous, Q8H, Spero Geralds, MD, 5,000 Units at 04/25/21 1452   MEDLINE mouth rinse, 15 mL, Mouth Rinse, 10 times per day, Spero Geralds, MD, 15 mL at 04/25/21 1600   perampanel (FYCOMPA) tablet 4 mg, 4 mg, Per Tube, QHS, Aslam, Sadia, MD   polyethylene glycol (MIRALAX / GLYCOLAX) packet 17 g, 17 g, Oral, Daily PRN, Spero Geralds, MD   valproic acid (DEPAKENE) 250 MG/5ML solution 250 mg, 250 mg, Per Tube, BID, Harvie Heck, MD  Current Outpatient Medications  Medication Instructions  albuterol (PROVENTIL HFA;VENTOLIN HFA) 108 (90 Base) MCG/ACT inhaler 1-2 puffs, Inhalation, Every 6 hours PRN   amitriptyline (ELAVIL) 25 MG tablet 1 tablet, Daily at bedtime   amitriptyline (ELAVIL) 25 mg, Oral, Daily at bedtime   chlorhexidine (PERIDEX) 0.12 % solution 15 mLs, Mouth/Throat, 2 times daily   clotrimazole (LOTRIMIN) 1 % cream Apply to affected area 2 times daily   cyclobenzaprine (FLEXERIL) 10 MG tablet cyclobenzaprine 10 mg tablet  Take 1 tablet twice a day by oral route.   Depakote 500 mg   diazepam (VALIUM) 5 mg, Oral, Every 12 hours PRN   divalproex (DEPAKOTE SPRINKLE) 250 mg, Oral, 2 times daily   doxepin (SINEQUAN) 50 MG capsule 1 capsuleat bedtime as needed for sleep   doxepin (SINEQUAN) 25 mg, 3 times daily   DULoxetine HCl 40 MG CPEP 1 capsule   famotidine (PEPCID) 20 mg, Oral, 2 times daily   haloperidol (HALDOL) 1 MG tablet haloperidol 1 mg tablet  Take 1 tablet every day by oral route at bedtime.   hydrOXYzine (ATARAX/VISTARIL) 25 mg, Oral, Daily PRN   linaclotide (LINZESS) 290 mcg, Oral, Daily before breakfast   lisdexamfetamine (VYVANSE) 60 mg, Oral, BH-each morning   LORazepam (ATIVAN) 1 MG tablet lorazepam 1 mg tablet  Take 1 tablet twice a day by oral route as needed.   metoCLOPramide (REGLAN) 5 mg, Oral, 3 times daily before meals    naproxen (NAPROSYN) 500 mg, Oral, 2 times daily, Take with food   perampanel (FYCOMPA) 4 mg, Oral, Daily   polyethylene glycol powder (GLYCOLAX/MIRALAX) 17 GM/SCOOP powder See admin instructions   pregabalin (LYRICA) 300 mg, 2 times daily   pregabalin (LYRICA) 200 mg, Oral, 3 times daily   promethazine (PHENERGAN) 25 MG tablet 1 tablet, Oral, Every 6 hours PRN   QUEtiapine (SEROQUEL) 25 MG tablet 1 tablet at bedtime for depression / anxiety / sleep   sucralfate (CARAFATE) 1 g, Oral, 3 times daily with meals & bedtime   tiZANidine (ZANAFLEX) 4 mg, 2 times daily PRN   tiZANidine (ZANAFLEX) 4 mg, Oral, 3 times daily   zaleplon (SONATA) 10 mg, Oral, Daily at bedtime   zolpidem (AMBIEN) 10 mg, Oral, Daily at bedtime    Exam: Current vital signs: BP 102/63   Pulse 85   Temp 98.5 F (36.9 C) (Axillary)   Resp 15   Ht 5' 10"  (1.778 m)   Wt 71.5 kg   SpO2 98%   BMI 22.62 kg/m  Vital signs in last 24 hours: Temp:  [96.6 F (35.9 C)-99.7 F (37.6 C)] 98.5 F (36.9 C) (11/07 1524) Pulse Rate:  [64-93] 85 (11/07 1703) Resp:  [11-22] 15 (11/07 1703) BP: (87-119)/(50-80) 102/63 (11/07 1530) SpO2:  [98 %-100 %] 98 % (11/07 1703) FiO2 (%):  [40 %-100 %] 40 % (11/07 1600) Weight:  [71.5 kg] 71.5 kg (11/07 0500)  GENERAL: Awake, alert, in no acute distress Psych: Affect appropriate for situation, patient is calm and cooperative with examination Head: Normocephalic and atraumatic, without obvious abnormality EENT: Normal conjunctivae, edentulous, oral ETT in place with securement device LUNGS: Respirations supported via mechanical ventilation, patient has spontaneous respirations over set ventilator rate CV: Regular rate and rhythm on telemetry ABDOMEN: Soft, non-tender, non-distended Extremities: warm, well perfused, bilateral lower extremities with 1+ nonpitting edema Skin: abrasions on bilateral knees  NEURO:  Mental Status: Awake, alert, and oriented to self, place, and is able to  provide some details regarding his history of present illness. History is limited due  to patient being intubated but he is able to answer questions appropriately by nodding "yes" or shaking "no". He also communicates with writing.  There is no evidence of aphasia or neglect.  Patient follows simple commands Cranial Nerves:  II: PERRL 3 mm/brisk. Visual fields full.  III, IV, VI: EOMI without ptosis, nystagmus, or gaze preference.  V: Blinks to threat throughout.  VII: Face is symmetric resting and and with movement within limits of ETT in place VIII: Hearing is intact to voice IX, X: Patient coughs on ventilator XI: Normal sternocleidomastoid and trapezius muscle strength XII: Does not protrude tongue to command   Motor: There is generalized weakness through grip strength is 5/5 bilaterally. Patient is able to elevate bilateral upper extremities with minimal antigravity movement without vertical drift.  Patient is able to lift right lower extremity against gravity but is unable to elevate the left lower extremity. He wiggles toes bilaterally. Patient complains of bilateral lower extremity pain.  Tone is normal. Bulk is normal.  Sensation: Patient endorses decreased sensation to light touch on bilateral upper and lower extremities Coordination: FTN intact bilaterally, unable to assess HKS. DTRs: 2+ and symmetric throughout Gait: Deferred  Labs I have reviewed labs in epic and the results pertinent to this consultation are: CBC    Component Value Date/Time   WBC 9.7 04/25/2021 0429   RBC 2.94 (L) 04/25/2021 0429   HGB 6.5 (LL) 04/25/2021 0442   HCT 19.0 (L) 04/25/2021 0442   PLT 253 04/25/2021 0429   MCV 80.6 04/25/2021 0429   MCH 24.1 (L) 04/25/2021 0429   MCHC 30.0 04/25/2021 0429   RDW 18.5 (H) 04/25/2021 0429   LYMPHSABS 1.0 04/24/2021 1156   MONOABS 0.3 04/24/2021 1156   EOSABS 0.0 04/24/2021 1156   BASOSABS 0.0 04/24/2021 1156   CMP     Component Value Date/Time   NA 139  04/25/2021 0442   K 3.3 (L) 04/25/2021 0442   CL 110 04/25/2021 0429   CO2 22 04/25/2021 0429   GLUCOSE 99 04/25/2021 0429   BUN 8 04/25/2021 0429   CREATININE 0.79 04/25/2021 0429   CALCIUM 7.5 (L) 04/25/2021 0429   PROT 5.8 (L) 04/24/2021 1156   ALBUMIN 2.5 (L) 04/24/2021 1156   AST 44 (H) 04/24/2021 1156   ALT 21 04/24/2021 1156   ALKPHOS 218 (H) 04/24/2021 1156   BILITOT 0.2 (L) 04/24/2021 1156   GFRNONAA >60 04/25/2021 0429   GFRAA >60 03/04/2019 1226   Lipid Panel  No results found for: CHOL, TRIG, HDL, CHOLHDL, VLDL, LDLCALC, LDLDIRECT No results found for: HGBA1C  Urinalysis    Component Value Date/Time   COLORURINE YELLOW 04/24/2021 1804   APPEARANCEUR CLEAR 04/24/2021 1804   LABSPEC 1.014 04/24/2021 1804   PHURINE 5.0 04/24/2021 [place] 04/24/2021 1804   HGBUR NEGATIVE 04/24/2021 1804   BILIRUBINUR NEGATIVE 04/24/2021 1804   KETONESUR 20 (A) 04/24/2021 1804   PROTEINUR NEGATIVE 04/24/2021 1804   NITRITE NEGATIVE 04/24/2021 1804   LEUKOCYTESUR NEGATIVE 04/24/2021 1804   Drugs of Abuse     Component Value Date/Time   LABOPIA NONE DETECTED 04/24/2021 1804   COCAINSCRNUR NONE DETECTED 04/24/2021 1804   LABBENZ POSITIVE (A) 04/24/2021 1804   AMPHETMU POSITIVE (A) 04/24/2021 1804   THCU POSITIVE (A) 04/24/2021 1804   LABBARB NONE DETECTED 04/24/2021 1804    Lab Results  Component Value Date   VALPROATE 30 (L) 04/24/2021    No results found for: VITAMINB12  No results found for:  FOLATE   Imaging I have reviewed the images obtained:  CT-scan of the brain 11/6: Negative CT head  Routine EEG complete,  This study is within normal limits. No seizures or epileptiform discharges were seen throughout the recording.  Assessment: 25 y.o. male with a history of seizures and polysubstance use who presented to [facility] following a witnessed seizure s/p Narcan administration and an aspiration event with hypoxia requiring intubation and prolonged  lethargy.  - Examination reveals patient that is awake and follows commands while he is intubated in the ICU. He continues to have lower extremity pain and weakness with suspected bilateral lower extremity cellulitis. He is able to communicate with head nodding/shaking and writing. Patient's lethargy is significantly improved from previous documentation.  - Patient may have had prolonged lethargy due to post-ictal state versus prolonged effect from sedative medications.  - UDS is positive for amphetamines (patient is on Vyvanse at home), benzodiazepines s/p Ativan administration in the ED, and THC.  - Patient has a known history of seizure disorder and endorses compliance with home mediations but his VPA level on hospital arrival is subtherapeutic at 30. Patient's seizure event could be due to missed medications, possible substance use/withdrawal, versus seizure in the setting of infection with suspected aspiration event and BLE cellulitis left greater than right foot, as well as hypoglycemia, and likely missed doses of Fycompa at home.   Impression: Lower extremity soft tissue or possible bone infection Concern for ongoing substance use though also on multiple psychoactive medications Acute on chronic anemia Concern for acute aspiration event Intermittent hypoglycemia  Recommendations: - B12, MMA, thiamine, and folate levels  - empiric B12, thiamine and folate after levels drawn - Goal euglycemia - Received Keppra 1000 mg on 11/6 and 500 mg Keppra q12 on 11/7 2:30 AM and 3 PM; will discontinue for now - Continue home Fycompa 4 mg daily and Depakote 250 mg twice daily - Ativan 2 mg IV PRN for seizures lasting > 5 minutes and notify neurology - Inpatient seizure precautions - Routine EEG read pending  - Initially considered MRI brain with prolonged lethargy, however, with return to baseline on neurology evaluation in the setting of known seizure disorder and multiple possible provoking factors,  would defer this study - Patient will need thorough medication reconciliation to avoid withdrawal symptoms in general and continuation of home benzodiazepines to avoid withdrawal seizures.  Per [facility] neurology records appears to have had an increase in his Valium from 5 mg twice daily to 10 mg twice daily, which matches his fill history.  Given that he has received significant sedating medications here including fentanyl and Ativan earlier today, will give a small dose of Valium 2.5 mg as a one-time dose tonight pending full confirmation of his home regimen, with goal of avoiding oversedation and still allow his extubation tomorrow while reducing risk of withdrawal seizures - Continue infectious work up and management per primary team as you are, including management of hypoglycemia, aspiration, anemia and substance withdrawal -Thank you for this interesting consult, neurology will be available on an as-needed basis going forward, please reach out if any questions or concerns arise  Pt seen by NP/Neuro and later by MD. Note/plan to be edited by MD as needed.  Anibal Henderson, AGAC-NP [facility] Pager: [phone]  Attending Neurologist's note:  I personally saw this patient, gathering history, performing a full neurologic examination, reviewing relevant labs, and formulated the assessment and plan, adding the note above for completeness and clarity to accurately  reflect my thoughts  CRITICAL CARE Performed by: Lorenza Chick   Total critical care time: 40 minutes  Critical care time was exclusive of separately billable procedures and treating other patients.  Critical care was necessary to treat or prevent imminent or life-threatening deterioration.  Critical care was time spent personally by me on the following activities: development of treatment plan with patient and/or surrogate as well as nursing, discussions with consultants, evaluation of patient's response to  treatment, examination of patient, obtaining history from patient or surrogate, ordering and performing treatments and interventions, ordering and review of laboratory studies, ordering and review of radiographic studies, pulse oximetry and re-evaluation of patient's condition.

## 2021-04-25 NOTE — Plan of Care (Signed)
Briefly discussed with Dr. Mcarthur Rossetti this is a 25 year old patient with past medical history significant for seizure disorder, anxiety/PTSD, polysubstance use (methadone, benzodiazepines, amphetamines, THC, cigarettes)  Per most recent neurology report available to me (04/10/2019, Dr. Malvin Johns in the [facility] health system) "Patient states that he has been having seizures for about 4 years ago he had one seizure that was caused by missing his benzodiazepines. No seizures since then until about 2 months ago. No known cause for these recent seizures. Patient is unaware of what happens during seizures. Per patient father the jerking last about 10-15 seconds. Patient just drops to the ground and starts convulsing. Vomits at times. Positive for tongue biting and incontinence. Taking lyrica 300 mg twice a day for 8-9 months for seizures. Taking Depakote 500 mg twice a day for about 2 months ago. History of PTSD and severe anxiety. No family history of seizures. ... IMPRESSION/PLAN  Mr. Renault is a 25 y.o. male presenting for evaluation of  SEIZURE - New to me.  - Patient with seizures for four years. Most recent seizure was 2 months ago. Per patient's father, patient has jerking movements, vomiting, and incontinence during seizure episodes.  - Patient's history is significant for anxiety and PTSD.  - Continue Lyrica 300 mg twice daily, refilled.  - Continue amitriptyline 25 mg twice daily, refilled.  - Continue Depakote 500 mg twice daily, refilled.  - Schedule routine EEG to evaluate for seizure-like activity.  - Send referral to psychiatry to establish care.  - Schedule brain MRI without contrast to evaluate for TBI. "  Since then patient has reportedly started on perampanel.  He presented for concern for seizure-like activity in the setting of intoxication (UDS positive for amphetamines which are not on his home medication list, benzodiazepines which he does have prescribed, and THC).  ED course  was notable for emesis with witnessed aspiration requiring intubation.  Per primary team he is now back to his neurological baseline and has not had any further events concerning for seizure activity.  He additionally has anemia and intermittent hypoglycemia  Discussed that this history and data are most consistent with provoked/breakthrough seizure in the setting of polysubstance use, and would recommend continued outpatient follow-up with substance abuse counseling and no change in his outpatient neurologic medications at this time.  Do not see a clear need for further inpatient neurological work-up at this time in the absence of new unprovoked neurologic symptoms. Happy to see the patient on an inpatient basis if there are new neurological concerns such as new focal deficits, ongoing spells concerning for seizure activity.  Management of anemia and hypoglycemia as well as lower extremity findings concerning for cellulitis per primary team.    This was a brief curbside discussion with data reviewed as documented above Brooke Dare MD-PhD [facility] [phone]  Available 7 AM to 7 PM, outside these hours please contact Neurologist on call listed on AMION

## 2021-04-25 NOTE — Progress Notes (Addendum)
Initial Nutrition Assessment  DOCUMENTATION CODES:   Not applicable  INTERVENTION:   Tube feeding via OG tube: Vital AF 1.2 at 40 ml/h, increase by 10 ml every 8 hours to goal rate of 70 ml/h (1680 ml per day)  Provides 2016 kcal, 126 gm protein, 1362 ml free water daily  Monitor magnesium, potassium, and phosphorus BID for at least 3 days, MD to replete as needed, as pt is at risk for refeeding syndrome given severe 25% weight loss over the past 2 months.  Add MVI with minerals via tube daily.  NUTRITION DIAGNOSIS:   Inadequate oral intake related to inability to eat as evidenced by NPO status.  GOAL:   Patient will meet greater than or equal to 90% of their needs  MONITOR:   Vent status, TF tolerance, Skin, Labs  REASON FOR ASSESSMENT:   Ventilator, Consult Enteral/tube feeding initiation and management  ASSESSMENT:   25 yo male admitted to [facility] with LE swelling/redness; he had a seizure, then developed hypoxia and required intubation. Transferred to [facility] 11/6. PMH includes PTSD, depression/anxiety, seizure disorder, microcytic anemia, polysubstance abuse, ADHD, COPD, scoliosis, tachycardia.  Discussed patient in ICU rounds and with RN today. Currently receiving Vital High Protein at 40 ml/h to provide 960 kcal, 84 gm protein, 803 ml free water daily.  Patient is having episodes of hypoglycemia.  Patient is currently intubated on ventilator support MV: 8.5 L/min Temp (24hrs), Avg:97.8 F (36.6 C), Min:96.6 F (35.9 C), Max:99.7 F (37.6 C)   Labs reviewed. K 3.3 CBG: [id number]  Medications reviewed and include KCl, Keppra, Pepcid, mag sulfate. IVF: D10 at 75 ml/h  Usual weights reviewed. Patient weighed 95 kg 03/01/21, currently 71.5 kg. 25% weight loss within 2 months is severe. Unable to complete NFPE at this time, but suspect some form of malnutrition is present, given severe weight loss.   NUTRITION - FOCUSED PHYSICAL EXAM:  Unable to  complete  Diet Order:   Diet Order             Diet NPO time specified  Diet effective now                   EDUCATION NEEDS:   No education needs have been identified at this time  Skin:  Skin Assessment: Reviewed RN Assessment (cellulitis to BLE)  Last BM:  no BM documented  Height:   Ht Readings from Last 1 Encounters:  04/24/21 5\' 10"  (1.778 m)    Weight:   Wt Readings from Last 1 Encounters:  04/25/21 71.5 kg    BMI:  Body mass index is 22.62 kg/m.  Estimated Nutritional Needs:   Kcal:  1965  Protein:  105-120 gm  Fluid:  >/= 2 L    13/07/22, RD, LDN, CNSC Please refer to Amion for contact information.

## 2021-04-25 NOTE — Progress Notes (Signed)
eLink Physician-Brief Progress Note Patient Name: Vincent Green DOB: 12/22/1995 MRN: [id number]   Date of Service  04/25/2021  HPI/Events of Note  Patient had a transfusion of 1 unit PRBC this AM for hemoglobin of 6.5 gm / dl.  eICU Interventions  H & H ordered.        Vincent Green Vincent Green 04/25/2021, 7:44 PM

## 2021-04-25 NOTE — Progress Notes (Signed)
eLink Physician-Brief Progress Note Patient Name: Vincent Green DOB: 11/14/1995 MRN: [id number]   Date of Service  04/25/2021  HPI/Events of Note  Anemia - Hgb = 6.5.   eICU Interventions  Will transfuse 1 unit PRBC.     Intervention Category Major Interventions: Other:  Lenell Antu 04/25/2021, 4:57 AM

## 2021-04-25 NOTE — Progress Notes (Signed)
eLink Physician-Brief Progress Note Patient Name: Vincent Green DOB: 1996/05/29 MRN: [id number]   Date of Service  04/25/2021  HPI/Events of Note  Agitation - Nursing request for bilateral soft wrist restraints.  eICU Interventions  Will order bilateral soft wrist restraint X 4 hours.     Intervention Category Major Interventions: Delirium, psychosis, severe agitation - evaluation and management  [name] 04/25/2021, 5:22 AM

## 2021-04-25 NOTE — Progress Notes (Signed)
Pharmacy Electrolyte Replacement  Recent Labs:  Recent Labs    04/25/21 0429 04/25/21 0442  K 3.4* 3.3*  MG 1.9  --   PHOS 3.0  --   CREATININE 0.79  --     Low Critical Values (K </= 2.5, Phos </= 1, Mg </= 1) Present: None  MD Contacted: n/a  Plan: KCl per x2 + KCl IV x4 runs. Magnesium replaced by MD.  Hennie Duos in AM.   Link Snuffer, PharmD, BCPS, BCCCP Clinical Pharmacist Please refer to [facility] for [facility] numbers 04/25/2021, 9:45 AM

## 2021-04-26 DIAGNOSIS — J9600 Acute respiratory failure, unspecified whether with hypoxia or hypercapnia: Secondary | ICD-10-CM | POA: Diagnosis not present

## 2021-04-26 LAB — GLUCOSE, CAPILLARY
Glucose-Capillary: 98 mg/dL (ref 70–99)
Glucose-Capillary: 83 mg/dL (ref 70–99)
Glucose-Capillary: 87 mg/dL (ref 70–99)
Glucose-Capillary: 99 mg/dL (ref 70–99)
Glucose-Capillary: 80 mg/dL (ref 70–99)
Glucose-Capillary: 98 mg/dL (ref 70–99)

## 2021-04-26 LAB — COMPREHENSIVE METABOLIC PANEL
ALT: 19 U/L (ref 0–44)
AST: 20 U/L (ref 15–41)
Albumin: 1.7 g/dL — ABNORMAL LOW (ref 3.5–5.0)
Alkaline Phosphatase: 130 U/L — ABNORMAL HIGH (ref 38–126)
Anion gap: 6 (ref 5–15)
BUN: 6 mg/dL (ref 6–20)
CO2: 22 mmol/L (ref 22–32)
Calcium: 8 mg/dL — ABNORMAL LOW (ref 8.9–10.3)
Chloride: 107 mmol/L (ref 98–111)
Creatinine, Ser: 0.79 mg/dL (ref 0.61–1.24)
GFR, Estimated: >60 mL/min (ref >60)
Glucose, Bld: 95 mg/dL (ref 70–99)
Potassium: 4.2 mmol/L (ref 3.5–5.1)
Sodium: 135 mmol/L (ref 135–145)
Total Bilirubin: 0.5 mg/dL (ref 0.3–1.2)
Total Protein: 4.2 g/dL — ABNORMAL LOW (ref 6.5–8.1)

## 2021-04-26 LAB — CBC
HCT: 27.5 % — ABNORMAL LOW (ref 39.0–52.0)
Hemoglobin: 8.1 g/dL — ABNORMAL LOW (ref 13.0–17.0)
MCH: 24 pg — ABNORMAL LOW (ref 26.0–34.0)
MCHC: 29.5 g/dL — ABNORMAL LOW (ref 30.0–36.0)
MCV: 81.6 fL (ref 80.0–100.0)
Platelets: 256 10*3/uL (ref 150–400)
RBC: 3.37 MIL/uL — ABNORMAL LOW (ref 4.22–5.81)
RDW: 18.6 % — ABNORMAL HIGH (ref 11.5–15.5)
WBC: 7.7 10*3/uL (ref 4.0–10.5)
nRBC: 0 % (ref 0.0–0.2)

## 2021-04-26 LAB — PHOSPHORUS: Phosphorus: 2.9 mg/dL (ref 2.5–4.6)

## 2021-04-26 LAB — TYPE AND SCREEN
ABO/RH(D): O POS
Antibody Screen: NEGATIVE
Unit division: 0

## 2021-04-26 LAB — VITAMIN B12: Vitamin B-12: 353 pg/mL (ref 180–914)

## 2021-04-26 LAB — BPAM RBC
Blood Product Expiration Date: 202212032359
ISSUE DATE / TIME: 202211070544
Unit Type and Rh: 5100

## 2021-04-26 LAB — FOLATE: Folate: 23 ng/mL (ref >5.9)

## 2021-04-26 LAB — MAGNESIUM: Magnesium: 1.8 mg/dL (ref 1.7–2.4)

## 2021-04-26 MED ORDER — THIAMINE HCL 100 MG/ML IJ SOLN
100.0000 mg | INTRAMUSCULAR | Status: DC
  Administered 2021-04-26 – 2021-04-27 (×2): 100 mg via INTRAVENOUS
  Filled 2021-04-26 (×2): qty 2

## 2021-04-26 MED ORDER — PERAMPANEL 2 MG PO TABS
4.0000 mg | ORAL_TABLET | Freq: Every day | ORAL | Status: DC
  Administered 2021-04-26 – 2021-05-03 (×8): 4 mg via ORAL
  Filled 2021-04-26 (×8): qty 2

## 2021-04-26 MED ORDER — SODIUM CHLORIDE 0.9 % IV SOLN: 1.0000 mg | Freq: Every day | INTRAVENOUS | Status: DC

## 2021-04-26 MED ORDER — VALPROIC ACID 250 MG/5ML PO SOLN
250.0000 mg | Freq: Two times a day (BID) | ORAL | Status: DC
  Administered 2021-04-26 – 2021-04-27 (×2): 250 mg via ORAL
  Filled 2021-04-26 (×4): qty 5

## 2021-04-26 MED ORDER — SODIUM CHLORIDE 0.9 % IV SOLN
2.0000 g | INTRAVENOUS | Status: AC
  Administered 2021-04-27 – 2021-04-29 (×3): 2 g via INTRAVENOUS
  Filled 2021-04-26 (×3): qty 20

## 2021-04-26 MED ORDER — ACETAMINOPHEN 325 MG PO TABS
650.0000 mg | ORAL_TABLET | Freq: Four times a day (QID) | ORAL | Status: DC | PRN
  Administered 2021-04-30 – 2021-05-01 (×2): 650 mg via ORAL
  Filled 2021-04-26 (×3): qty 2

## 2021-04-26 MED ORDER — ENOXAPARIN SODIUM 40 MG/0.4ML IJ SOSY
40.0000 mg | PREFILLED_SYRINGE | Freq: Every day | INTRAMUSCULAR | Status: DC
  Administered 2021-04-26 – 2021-04-28 (×3): 40 mg via SUBCUTANEOUS
  Filled 2021-04-26 (×3): qty 0.4

## 2021-04-26 MED ORDER — DEXTROSE 10 % IV SOLN: INTRAVENOUS | Status: DC

## 2021-04-26 MED ORDER — VITAL AF 1.2 CAL PO LIQD: 1000.0000 mL | ORAL | Status: DC

## 2021-04-26 MED ORDER — MAGNESIUM SULFATE 4 GM/100ML IV SOLN
4.0000 g | Freq: Once | INTRAVENOUS | Status: AC
  Administered 2021-04-26: 4 g via INTRAVENOUS
  Filled 2021-04-26 (×2): qty 100

## 2021-04-26 MED ORDER — HYDROMORPHONE HCL 1 MG/ML IJ SOLN
1.0000 mg | INTRAMUSCULAR | Status: DC | PRN
  Administered 2021-04-26 – 2021-04-27 (×2): 1 mg via INTRAVENOUS
  Filled 2021-04-26 (×3): qty 1

## 2021-04-26 MED ORDER — SODIUM CHLORIDE 0.9 % IV SOLN
12.5000 mg | Freq: Once | INTRAVENOUS | Status: DC
  Filled 2021-04-26: qty 0.5

## 2021-04-26 MED ORDER — DEXTROSE 10 % IV SOLN: INTRAVENOUS | Status: AC

## 2021-04-26 MED ORDER — FOLIC ACID 5 MG/ML IJ SOLN
1.0000 mg | Freq: Every day | INTRAMUSCULAR | Status: DC
  Administered 2021-04-26 – 2021-04-27 (×2): 1 mg via INTRAVENOUS
  Filled 2021-04-26 (×2): qty 0.2

## 2021-04-26 MED ORDER — PREGABALIN 100 MG PO CAPS
200.0000 mg | ORAL_CAPSULE | Freq: Three times a day (TID) | ORAL | Status: DC
  Administered 2021-04-26 – 2021-05-04 (×23): 200 mg via ORAL
  Filled 2021-04-26 (×23): qty 2

## 2021-04-26 MED ORDER — HYDROMORPHONE HCL 1 MG/ML IJ SOLN
1.0000 mg | Freq: Once | INTRAMUSCULAR | Status: AC
  Administered 2021-04-26: 1 mg via INTRAVENOUS

## 2021-04-26 MED ORDER — ADULT MULTIVITAMIN W/MINERALS CH
1.0000 | ORAL_TABLET | Freq: Every day | ORAL | Status: DC
  Administered 2021-04-27 – 2021-05-04 (×8): 1 via ORAL
  Filled 2021-04-26 (×10): qty 1

## 2021-04-26 MED ORDER — PROMETHAZINE HCL 12.5 MG PO TABS
12.5000 mg | ORAL_TABLET | Freq: Four times a day (QID) | ORAL | Status: DC | PRN
  Administered 2021-04-27 – 2021-04-28 (×3): 12.5 mg via ORAL
  Filled 2021-04-26 (×4): qty 1

## 2021-04-26 MED ORDER — DIAZEPAM 5 MG PO TABS
5.0000 mg | ORAL_TABLET | Freq: Two times a day (BID) | ORAL | Status: DC | PRN
  Administered 2021-04-26 – 2021-04-27 (×2): 5 mg via ORAL
  Filled 2021-04-26 (×2): qty 1

## 2021-04-26 MED ORDER — CYANOCOBALAMIN 1000 MCG/ML IJ SOLN
1000.0000 ug | Freq: Once | INTRAMUSCULAR | Status: AC
  Administered 2021-04-26: 1000 ug via INTRAMUSCULAR
  Filled 2021-04-26: qty 1

## 2021-04-26 NOTE — Progress Notes (Signed)
Assisted tele visit to patient with father. ° °Shamell Hittle P, RN  °

## 2021-04-26 NOTE — Evaluation (Signed)
Clinical/Bedside Swallow Evaluation Patient Details  Name: Vincent Green MRN: [id number] Date of Birth: 05-Mar-1996  Today's Date: 04/26/2021 Time: SLP Start Time (ACUTE ONLY): 1205 SLP Stop Time (ACUTE ONLY): 1213 SLP Time Calculation (min) (ACUTE ONLY): 8 min  Past Medical History:  Past Medical History:  Diagnosis Date   ADHD    Anxiety    Anxiety    Chronic dental pain    COPD (chronic obstructive pulmonary disease) (HCC)    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 (documented 08/12/19)   Tachycardia    Past Surgical History:  Past Surgical History:  Procedure Laterality Date   CHOANAL ATRESIA REPAIR     NOSE SURGERY     SKIN GRAFT     HPI:  Vincent Green is a 25yr old male who initially presented to [facility] ER on 11/6 with worsening lower extremity swelling and pain for two days. While in ER, patient became altered with pinpoint pupils for which given Narcan with improvement. However, noted to have witnessed seizure - becoming hypoxic, suspect aspiration requiring intubation for airway protection. Transferred to [facility] for further ICU care. Extubated 11/8 at 9 am. CXR 11/6: "Bilateral lower lobe airspace opacities progressed since the  prior radiograph." Head CT 11/6 negative. Pt with PMHx of PTSD, depression/anxiety, seizure disorder, microcytic anemia, and polysubstance use with ?murmur    Assessment / Plan / Recommendation  Clinical Impression  Pt presents with functional swallowing as assessed clinically.  Pt tolerated all consistencies trialed without overt s/s of aspiration. Pt exhibited excellent oral clearance of solids independently. Pt has hoarse vocal quality at baseline and he reports changes to voice post extubation.  Pt was able to feed self, but was a little slow at self feeding and with siphoning liquid from cup and straw.  Overall  pt appears weak.  Pt is young and was intubated for under 40 hours.  Aspiration event is likely related to episode of emisis rather than ongoing prandial aspiration.    Recommend regular texture diet with thin liuqids.  SLP to follow for diet tolerance.  SLP Visit Diagnosis: Dysphagia, oropharyngeal phase (R13.12)    Aspiration Risk  Mild aspiration risk    Diet Recommendation Regular;Thin liquid   Liquid Administration via: Cup;Straw Medication Administration:  (As tolerated) Supervision: Intermittent supervision to cue for compensatory strategies Compensations: Slow rate;Small sips/bites Postural Changes: Seated upright at 90 degrees    Other  Recommendations Oral Care Recommendations: Oral care BID    Recommendations for follow up therapy are one component of a multi-disciplinary discharge planning process, led by the attending physician.  Recommendations may be updated based on patient status, additional functional criteria and insurance authorization.  Follow up Recommendations  (TBD)      Assistance Recommended at Discharge  (TBD)  Functional Status Assessment Patient has had a recent decline in their functional status and/or demonstrates limited ability to make significant improvements in function in a reasonable and predictable amount of time  Frequency and Duration min 2x/week  2 weeks       Prognosis Prognosis for Safe Diet Advancement: Good      Swallow Study   General HPI: Vincent Green is a 25yr old male who initially presented to [facility] ER on 11/6 with worsening lower extremity swelling and pain for two days. While in ER, patient became  altered with pinpoint pupils for which given Narcan with improvement. However, noted to have witnessed seizure - becoming hypoxic, suspect aspiration requiring intubation for airway protection. Transferred to [facility] for further ICU care. Extubated 11/8 at 9 am. CXR 11/6: "Bilateral lower lobe airspace opacities progressed  since the  prior radiograph." Head CT 11/6 negative. Pt with PMHx of PTSD, depression/anxiety, seizure disorder, microcytic anemia, and polysubstance use with ?murmur Type of Study: Bedside Swallow Evaluation Previous Swallow Assessment: None Diet Prior to this Study: NPO Temperature Spikes Noted: No Respiratory Status: Nasal cannula History of Recent Intubation: Yes Length of Intubations (days): 2 days Date extubated: 04/26/21 Behavior/Cognition: Alert;Cooperative;Pleasant mood Oral Cavity Assessment: Within Functional Limits Oral Care Completed by SLP: No Oral Cavity - Dentition: Edentulous Vision: Functional for self-feeding Self-Feeding Abilities: Able to feed self Patient Positioning: Upright in bed Baseline Vocal Quality: Hoarse Volitional Cough: Weak Volitional Swallow: Able to elicit    Oral/Motor/Sensory Function Overall Oral Motor/Sensory Function: Mild impairment Facial ROM: Reduced right;Reduced left Facial Symmetry: Within Functional Limits Lingual ROM: Reduced right;Reduced left Lingual Symmetry: Within Functional Limits Lingual Strength: Reduced Mandible: Within Functional Limits   Ice Chips Ice chips: Not tested   Thin Liquid Thin Liquid: Within functional limits Presentation: Cup;Straw    Nectar Thick Nectar Thick Liquid: Not tested   Honey Thick Honey Thick Liquid: Not tested   Puree Puree: Within functional limits Presentation: Spoon   Solid     Solid: Within functional limits Presentation: Self Fed      Kerrie Pleasure, MA, CCC-SLP Acute Rehabilitation Services Office: [phone] 04/26/2021,12:39 PM

## 2021-04-26 NOTE — Consult Note (Signed)
WOC Nurse wound consult note Consultation was completed by review of records, images and assistance from the bedside nurse/clinical staff.  Reason for Consult: penile wounds Wound type:infectious? Pressure Injury POA: NA Measurement:see nursing flow sheets Wound WGY:[id number] with bedside nurse; open lesion; painful; not necrotic Periwound: intact  Dressing procedure/placement/frequency:  Xeroform as non adherent to protect; antibacterial properties  Bedside nurse reports significant pain with this lesion; significant pain with urination. Having intermittent cath for urinary retention. Patient is moaning while I am discussing with bedside nurse Would recommend urology consult to evaluate lesion; determine etiology and needs for treatment if STD related. This would be considered outside of the scope of practice for the [facility] nurse  Topical care orders updated.   Discussed POC with bedside nurse.  Re consult if needed, will not follow at this time. Thanks  Ahana Najera [employer], RN,CWOCN, CNS, CWON-AP [phone])

## 2021-04-26 NOTE — Procedures (Signed)
Extubation Procedure Note  Patient Details:   Name: Vincent Green DOB: Jan 05, 1996 MRN: [id number]   Airway Documentation:    Vent end date: 04/26/21 Vent end time: 0900   Evaluation  O2 sats: stable throughout Complications: No apparent complications Patient did tolerate procedure well. Bilateral Breath Sounds: Stridor (post-extubation)   Patient extubated per MD order & placed on 4L [place]. Pt able to speak & cough post extubation. Pt has some stridor, but currently says he feels ok & in no distress. Will continue to monitor.  Jacqulynn Cadet 04/26/2021, 9:17 AM

## 2021-04-26 NOTE — Progress Notes (Signed)
NAME:  Vincent Green, MRN:  [id number], DOB:  [birth year], LOS: 2 ADMISSION DATE:  04/24/2021, CONSULTATION DATE:  04/24/2021 REFERRING MD:  Dr Roderic Palau, CHIEF COMPLAINT:  Hypoxic respiratory failure   History of Present Illness:  Mr Vincent Green is a 25yrold male with PMHx of PTSD, depression/anxiety, seizure disorder, microcytic anemia, and polysubstance use with ?murmur [facility] worsening lower extremity swelling and pain for two days. Similar symptoms after a fall on 8/20, 9/8 and 9/13. Found to have undocumented heart murmur on 9/8. LLE XR negative, [place] for DVT and given reports of intermittent fevers, MRI and further labs work up, however patient left AMA prior to getting these.  On ER evaluation 11/6, temp 99. Labs with Hb 6 (previously 9.1 on 9/8), alk phos 218, low protein/abumin, AST 44. FOBT negative. Noted to have LE swelling with erythema and warmth. While in ER, patient became altered with pinpoint pupils for which given Narcan with improvement. However, noted to have witnessed seizure - becoming hypoxic, suspect aspiration requiring intubation for airway protection. Sedated on propofol and given 1u pRBC. Given vancomycin, cefepime and azithromycin. [facility]   Pertinent  Medical History   Past Medical History:  Diagnosis Date   ADHD    Anxiety    Anxiety    Chronic dental pain    COPD (chronic obstructive pulmonary disease) (HCC)    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 (documented 08/12/19)   Tachycardia    Significant Hospital Events: Including procedures, antibiotic start and stop dates in addition to other pertinent events   11/6 > presented with AP with LE swelling/redness. AMS, pinpoint pupils, responded to Narcan but then had witnessed seizure; f/b  hypoxia requiring intubation for airway protection, suspected aspiration. Cefepime, vanc, azithromycin  11/7> overnight, noted to be hypothermic for which bair hugger placed. Hypotensive for which given 1L LR with improvement. Given 1u pRBC for Hb 6.5. Continued on full vent support  Interim History / Subjective:  No acute overnight events Patient awake, alert and interactive this morning on vent. Appropriately following commands   Objective   Blood pressure 101/63, pulse 72, temperature 98.7 F (37.1 C), temperature source Oral, resp. rate 14, height _0  (1.778 m), weight 75.9 kg, SpO2 100 %.    Vent Mode: PRVC FiO2 (%):  [40 %-100 %] 40 % Set Rate:  [14 bmp] 14 bmp Vt Set:  [580 mL] 580 mL PEEP:  [5 cmH20] 5 cmH20 Pressure Support:  [10 cmH20] 10 cmH20 Plateau Pressure:  [12 cmH20-16 cmH20] 12 cmH20   Intake/Output Summary (Last 24 hours) at 04/26/2021 0715 Last data filed at 04/26/2021 0500 Gross per 24 hour  Intake 3236.63 ml  Output 950 ml  Net 2286.63 ml   Filed Weights   04/24/21 1138 04/25/21 0500 04/26/21 0500  Weight: 81.6 kg 71.5 kg 75.9 kg    Examination: General: acute ill appearing young male, endotracheally intubated HENT: [place]/AT, EOMI, PERRL, intubated Lungs: diminished at bilateral bases; otherwise clear to auscultation Cardiovascular: RRR, S1 and S2 present, no m/r/g Abdomen: soft, nondistended, +BS Extremities: bilat lower extremities with nonpitting edema; improved erythema Neuro: awake and alert; following commands appropriately, no apparent focal deficits noted Skin: bilat lower extremities with improving erythema to bilat shin  Resolved Hospital  Problem list    Assessment & Plan:  Acute hypoxic respiratory failure 2/2 aspiration pneumonitis S/p cefepime and azithromycin. Received 2 doses of vancomycin.  Respiratory culture with rare gram positive cocci. Not at risk for MRSA - Switch to IV Rocephin -SBT trial > extubate today if tolerating  - F/u  respiratory culture - VAP prevention protocol/ continue I3K  Acute metabolic encephalopathy in setting of polysubstance use  Seizure disorder  Initially pinpoint pupils for which received one dose of Narcan on admission with adequate response. UDS +ve for benzos, amphetamines and THC. Hx of seizures for which on depakote and perampanel at home. Reports med adherence although depakote levels low on admission. Suspect breakthrough seizure in setting of medication nonadherence, polysubstance use vs infection.  - Neuro consulted, appreciate recs  - Resume home meds of Fycompa and depakote  - Ativan prn for breakthrough seizures  BLE cellulitis - POA BLE edema and erythema to mid tibia noted on exam. This has been present since September 2022; bilat DVT US negative x2. Exam most consistent with cellulitis. Received vancomycin for 2 days. Not at risk for MRSA.  - IV rocephin  - F/u blood cultures   Acute on chronic microcytic anemia  Presented with Hb 6 (baseline ~9). Has history of microcytic anemia; possibly IDA. Transfused 2u pRBC. Hb 8.1 this AM. FOBT negative. No other obvious signs of bleeding at this time. - Trend CBC - Transfuse to maintain Hb >7    Hypoglycemia - D10gtt 75cc/hr - Tube feeds; encourage PO intake once extubated  Best Practice (right click and "Reselect all SmartList Selections" daily)   Diet/type: tubefeeds DVT prophylaxis: prophylactic heparin  GI prophylaxis: H2B Lines: N/A Foley:  N/A Code Status:  full code Last date of multidisciplinary goals of care discussion [pending]  Labs   CBC: Recent Labs  Lab 04/24/21 1156 04/24/21 2015 04/24/21 2257 04/25/21 0429 04/25/21 0442 04/25/21 1953  WBC 6.3  --  13.0* 9.7  --   --   NEUTROABS 5.0  --   --   --   --   --   HGB 6.0* 7.5* 8.1* 7.1* 6.5* 7.7*  HCT 22.2* 22.0* 27.3* 23.7* 19.0* 25.9*  MCV 82.8  --  81.0 80.6  --   --   PLT 273  --  263 253  --   --     Basic Metabolic Panel: Recent Labs  Lab  04/24/21 1156 04/24/21 1949 04/24/21 2015 04/25/21 0429 04/25/21 0442  NA 137  --  139 137 139  K 3.6  --  3.1* 3.4* 3.3*  CL 104  --   --  110  --   CO2 26  --   --  22  --   GLUCOSE 104*  --   --  99  --   BUN 12  --   --  8  --   CREATININE 0.91  --   --  0.79  --   CALCIUM 8.6*  --   --  7.5*  --   MG  --  1.6*  --  1.9  --   PHOS  --  2.3*  --  3.0  --    GFR: Estimated Creatinine Clearance: 144.5 mL/min (by C-G formula based on SCr of 0.79 mg/dL). Recent Labs  Lab 04/24/21 1156 04/24/21 2257 04/25/21 0429  WBC 6.3 13.0* 9.7    Liver Function Tests: Recent Labs  Lab 04/24/21 1156  AST 44*  ALT 21  ALKPHOS 218*  BILITOT 0.2*  PROT 5.8*  ALBUMIN 2.5*   Recent Labs  Lab 04/25/21 1419  LIPASE 23   No results for input(s): AMMONIA in the last 168 hours.  ABG    Component Value Date/Time   PHART 7.420 04/25/2021 0442   PCO2ART 36.0 04/25/2021 0442   PO2ART 172 (H) 04/25/2021 0442   HCO3 23.2 04/25/2021 0442   TCO2 24 04/25/2021 0442   ACIDBASEDEF 1.0 04/25/2021 0442   O2SAT 100.0 04/25/2021 0442     Coagulation Profile: No results for input(s): INR, PROTIME in the last 168 hours.  Cardiac Enzymes: No results for input(s): CKTOTAL, CKMB, CKMBINDEX, TROPONINI in the last 168 hours.  HbA1C: No results found for: HGBA1C  CBG: Recent Labs  Lab 04/25/21 1522 04/25/21 1553 04/25/21 1954 04/25/21 2341 04/26/21 0342  GLUCAP 53* 121* 103* 91 98   Critical care time: 35 minutes     Harvie Heck, MD Internal Medicine, PGY-3 04/26/21 7:15 AM Pager # [phone]

## 2021-04-27 ENCOUNTER — Inpatient Hospital Stay (HOSPITAL_COMMUNITY): Payer: Medicaid Other

## 2021-04-27 DIAGNOSIS — J9601 Acute respiratory failure with hypoxia: Secondary | ICD-10-CM | POA: Diagnosis not present

## 2021-04-27 DIAGNOSIS — R609 Edema, unspecified: Secondary | ICD-10-CM

## 2021-04-27 DIAGNOSIS — M7989 Other specified soft tissue disorders: Secondary | ICD-10-CM

## 2021-04-27 LAB — T4, FREE: Free T4: 1.07 ng/dL (ref 0.61–1.12)

## 2021-04-27 LAB — CULTURE, RESPIRATORY W GRAM STAIN: Culture: NORMAL

## 2021-04-27 LAB — RESP PANEL BY RT-PCR (FLU A&B, COVID) ARPGX2
Influenza A by PCR: NEGATIVE
Influenza B by PCR: NEGATIVE
SARS Coronavirus 2 by RT PCR: NEGATIVE

## 2021-04-27 LAB — GC/CHLAMYDIA PROBE AMP (~~LOC~~) NOT AT ARMC
Chlamydia: NEGATIVE
Comment: NEGATIVE
Comment: NORMAL
Neisseria Gonorrhea: NEGATIVE

## 2021-04-27 LAB — TSH: TSH: 2.905 u[IU]/mL (ref 0.350–4.500)

## 2021-04-27 LAB — RPR: RPR Ser Ql: NONREACTIVE

## 2021-04-27 LAB — GLUCOSE, CAPILLARY
Glucose-Capillary: 89 mg/dL (ref 70–99)
Glucose-Capillary: 87 mg/dL (ref 70–99)
Glucose-Capillary: 81 mg/dL (ref 70–99)
Glucose-Capillary: 91 mg/dL (ref 70–99)
Glucose-Capillary: 82 mg/dL (ref 70–99)

## 2021-04-27 MED ORDER — FERROUS SULFATE 325 (65 FE) MG PO TABS
325.0000 mg | ORAL_TABLET | Freq: Every day | ORAL | Status: DC
  Administered 2021-04-28 – 2021-05-04 (×7): 325 mg via ORAL
  Filled 2021-04-27 (×9): qty 1

## 2021-04-27 MED ORDER — FOLIC ACID 1 MG PO TABS
1.0000 mg | ORAL_TABLET | Freq: Every day | ORAL | Status: DC
  Administered 2021-04-28 – 2021-05-04 (×7): 1 mg via ORAL
  Filled 2021-04-27 (×7): qty 1

## 2021-04-27 MED ORDER — VALPROIC ACID 250 MG/5ML PO SOLN
750.0000 mg | Freq: Every day | ORAL | Status: DC
  Administered 2021-04-27 – 2021-05-03 (×7): 750 mg via ORAL
  Filled 2021-04-27 (×7): qty 15

## 2021-04-27 MED ORDER — HYDROCODONE-ACETAMINOPHEN 5-325 MG PO TABS
1.0000 | ORAL_TABLET | Freq: Four times a day (QID) | ORAL | Status: DC | PRN
  Administered 2021-04-27 – 2021-04-29 (×7): 1 via ORAL
  Filled 2021-04-27 (×8): qty 1

## 2021-04-27 MED ORDER — METHADONE HCL 10 MG PO TABS
50.0000 mg | ORAL_TABLET | Freq: Every day | ORAL | Status: DC
  Administered 2021-04-27 – 2021-05-02 (×6): 50 mg via ORAL
  Filled 2021-04-27 (×6): qty 5

## 2021-04-27 MED ORDER — VALPROIC ACID 250 MG/5ML PO SOLN
250.0000 mg | Freq: Every day | ORAL | Status: DC
  Administered 2021-04-28 – 2021-05-04 (×7): 250 mg via ORAL
  Filled 2021-04-27 (×7): qty 5

## 2021-04-27 MED ORDER — THIAMINE HCL 100 MG PO TABS
100.0000 mg | ORAL_TABLET | Freq: Every day | ORAL | Status: DC
  Administered 2021-04-28 – 2021-05-04 (×7): 100 mg via ORAL
  Filled 2021-04-27 (×7): qty 1

## 2021-04-27 MED ORDER — DIAZEPAM 5 MG PO TABS
5.0000 mg | ORAL_TABLET | Freq: Two times a day (BID) | ORAL | Status: DC
  Administered 2021-04-27 – 2021-05-02 (×9): 5 mg via ORAL
  Filled 2021-04-27 (×10): qty 1

## 2021-04-27 NOTE — Progress Notes (Signed)
Lower extremity venous has been completed.   Preliminary results in CV Proc.   Aundra Millet Mischell [place] 04/27/2021 10:39 AM

## 2021-04-27 NOTE — Progress Notes (Signed)
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 nurse called and verified that the patient is on 90 mg at his treatment center ([facility]). Patient states he does not want to go on the full amount and would like to stay at 50 mg until he is able to come off the medication. Patient has concern that he is on too many medications and would like to "do without some of the ones I can get off of."

## 2021-04-27 NOTE — Progress Notes (Signed)
PROGRESS NOTE    Vincent Green  IRC:[id number] DOB: March 03, 1996 DOA: 04/24/2021 PCP: Leonie Douglas, MD   Chief Complaint  Patient presents with   Edema  Brief Narrative/Hospital Course:  Vincent Green, 25 y.o. male with PMH of PTSD, depression/anxiety, seizure disorder, microcytic anemia, and polysubstance use admitted for acute metabolic toxic encephalopathy with breakthrough seizure in setting of polysubstance use at [facility] where he was intubated for acute hypoxic respiratory failure initially with concerns of aspiration pneumonitis, transferred to [facility], ICU. Has been covered with vancomycin-switched to rocephin for this and also for bilateral lower extremity cellulitis he was extubated 11/8 to follow nasal cannula seen by neurology in the ED adjusted. Transferred to [facility] 11/9.    Subjective: He is aaox3, slow to speak but communicative, moving all his extremities, has leg edema more on rle, no chest pain no back pain or focal weakness On having some dysurea  Assessment & Plan:  Acute hypoxemic respiratory failure Acute toxic encephalopathy in the setting of polysubstance abuse and seizure: Patient was intubated and subsequently extubated 11/8.  This morning on room air. Mental status is stable alert awake oriented x3 but appears somewhat slow to respond.  Seen by neurology.  Continue with the current AEDs, PT OT and supportive care  Aspiration pneumonitis: On ceftriaxone.  Clinically improved.  Bilateral lower extremity cellulitis POA B/l Leg edema-appears chronic: Redness improved on ceftriaxone.  Leg edema appears somewhat chronic in nature.TTE on 04/25/21:lvef 55-60%,no RWMA,his duplex on leg were negative in sept 2022.Check duplex again  Polysubstance abuse hx- uds + for benzo, amphetamine and thc. Denies substrance abuse, reports he does do marijuawana and other meds are prescribed.  PTSD/anxiety/depression on multiple psychiatric medication it appears not taking  all of them.  Psychiatry consulted to adjust the medication as he endorses anxiety and depression.  Check TSH and free T3 3 4  Acute on chronic microcytic anemia:hemoglobin 6 on admission baseline 9 g with S/P to need PRBC.  FOBT was negative.  Monitor H&H transfuse for less than 7 g Recent Labs  Lab 04/24/21 2257 04/25/21 0429 04/25/21 0442 04/25/21 1953 04/26/21 0639  HGB 8.1* 7.1* 6.5* 7.7* 8.1*  HCT 27.3* 23.7* 19.0* 25.9* 27.5*    Hypoglycemia needing D10 while in ICU. Taking p.o. now.  Blood sugar is stable Recent Labs  Lab 04/26/21 1513 04/26/21 1919 04/26/21 2318 04/27/21 0400 04/27/21 0815  GLUCAP 99 80 98 89 87    Painful penile lesion:GC/Chlamydia pending.  Follow-up  Deconditioning/debility/generalized weakness continue PT OT for disposition  DVT prophylaxis: enoxaparin (LOVENOX) injection 40 mg Start: 04/26/21 2200 SCDs Start: 04/24/21 1831 Code Status:   Code Status: Full Code Family Communication: plan of care discussed with patient at bedside. Status is: Inpatient Remains inpatient appropriate because: Ongoing management of her generalized weakness acute encephalopathy post ICU care  Objective: Vitals last 24 hrs: Vitals:   04/27/21 0130 04/27/21 0200 04/27/21 0217 04/27/21 0817  BP: 116/78 104/75 102/67 102/63  Pulse: 65 65 65 71  Resp: 13 10 16 16   Temp:   97.9 F (36.6 C) 97.9 F (36.6 C)  TempSrc:   Oral Oral  SpO2: 98% 95% 99% 99%  Weight:   75.4 kg   Height:       Weight change: -0.5 kg  Intake/Output Summary (Last 24 hours) at 04/27/2021 0824 Last data filed at 04/27/2021 0200 Gross per 24 hour  Intake 1033.22 ml  Output 1100 ml  Net -66.78 ml   Net IO Since  Admission: 4,660.81 mL [04/27/21 0824]   Physical Examination: General exam: AA0x3,weak,older than stated age. HEENT:Oral mucosa moist, Ear/Nose WNL grossly,dentition normal. Respiratory system: B/l clear BS, no use of accessory muscle, non tender. Cardiovascular system: S1 & S2  +,No JVD. Gastrointestinal system: Abdomen soft, NT,ND, BS+. Nervous System:Alert, awake, moving extremities. Extremities: edema LE more RLE than LLE , distal peripheral pulses palpable.  Skin: No rashes, no icterus. MSK: Normal muscle bulk, tone, power.  Medications reviewed:  Scheduled Meds:  sodium chloride   Intravenous Once   chlorhexidine gluconate (MEDLINE KIT)  15 mL Mouth Rinse BID   enoxaparin (LOVENOX) injection  40 mg Subcutaneous QHS   folic acid  1 mg Intravenous Daily   mouth rinse  15 mL Mouth Rinse 10 times per day   multivitamin with minerals  1 tablet Oral Daily   perampanel  4 mg Oral QHS   pregabalin  200 mg Oral TID   thiamine injection  100 mg Intravenous Q24H   valproic acid  250 mg Oral BID   Continuous Infusions:  cefTRIAXone (ROCEPHIN)  IV     dextrose 50 mL/hr at 04/27/21 0200    Diet Order             Diet regular Room service appropriate? Yes; Fluid consistency: Thin  Diet effective now                   Nutrition Problem: Inadequate oral intake Etiology: inability to eat Signs/Symptoms: NPO status Interventions: MVI, Tube feeding  Weight change: -0.5 kg  Wt Readings from Last 3 Encounters:  04/27/21 75.4 kg  03/01/21 95 kg  02/05/21 95.3 kg     Consultants:see note  Procedures:see note Antimicrobials: Anti-infectives (From admission, onward)    Start     Dose/Rate Route Frequency Ordered Stop   04/27/21 1000  cefTRIAXone (ROCEPHIN) 2 g in sodium chloride 0.9 % 100 mL IVPB        2 g 200 mL/hr over 30 Minutes Intravenous Every 24 hours 04/26/21 1244 04/30/21 0959   04/25/21 1530  cefTRIAXone (ROCEPHIN) 1 g in sodium chloride 0.9 % 100 mL IVPB  Status:  Discontinued        1 g 200 mL/hr over 30 Minutes Intravenous Every 24 hours 04/25/21 1436 04/26/21 1244   04/25/21 0100  vancomycin (VANCOREADY) IVPB 1500 mg/300 mL  Status:  Discontinued        1,500 mg 150 mL/hr over 120 Minutes Intravenous Every 12 hours 04/24/21 1509  04/25/21 1436   04/24/21 2300  cefTRIAXone (ROCEPHIN) 1 g in sodium chloride 0.9 % 100 mL IVPB  Status:  Discontinued        1 g 200 mL/hr over 30 Minutes Intravenous Every 24 hours 04/24/21 2209 04/24/21 2211   04/24/21 1500  Ampicillin-Sulbactam (UNASYN) 3 g in sodium chloride 0.9 % 100 mL IVPB  Status:  Discontinued        3 g 200 mL/hr over 30 Minutes Intravenous  Once 04/24/21 1445 04/24/21 1452   04/24/21 1500  ceFEPIme (MAXIPIME) 1 g in sodium chloride 0.9 % 100 mL IVPB  Status:  Discontinued        1 g 200 mL/hr over 30 Minutes Intravenous  Once 04/24/21 1453 04/24/21 1918   04/24/21 1500  azithromycin (ZITHROMAX) 500 mg in sodium chloride 0.9 % 250 mL IVPB        500 mg 250 mL/hr over 60 Minutes Intravenous  Once 04/24/21 1453 04/24/21 1808   04/24/21  1500  vancomycin (VANCOREADY) IVPB 1000 mg/200 mL        1,000 mg 200 mL/hr over 60 Minutes Intravenous  Once 04/24/21 1456 04/24/21 2109      Culture/Microbiology    Component Value Date/Time   SDES TRACHEAL ASPIRATE 04/24/2021 1919   SPECREQUEST NONE 04/24/2021 1919   CULT  04/24/2021 1919    RARE Normal respiratory flora-no Staph aureus or Pseudomonas seen Performed at [facility], [place] [address], [place], [place] [address]    REPTSTATUS PENDING 04/24/2021 1919    Other culture-see note  Unresulted Labs (From admission, onward)     Start     Ordered   04/26/21 1219  RPR  (GC, Chlamydia, RPR Panel (PNL))  Add-on,   AD       Question:  Specimen collection method  Answer:  Lab=Lab collect   04/26/21 1218   04/26/21 0500  Methylmalonic acid, serum  Once,   R       Question:  Specimen collection method  Answer:  Lab=Lab collect   04/25/21 2222   04/26/21 0500  Vitamin B1  Once,   R       Question:  Specimen collection method  Answer:  Lab=Lab collect   04/25/21 2222   04/25/21 2000  Drug Screen 10 W/Conf, Serum  Once,   R        04/25/21 2000          Data Reviewed: I have personally reviewed following  labs and imaging studies CBC: Recent Labs  Lab 04/24/21 1156 04/24/21 2015 04/24/21 2257 04/25/21 0429 04/25/21 0442 04/25/21 1953 04/26/21 0639  WBC 6.3  --  13.0* 9.7  --   --  7.7  NEUTROABS 5.0  --   --   --   --   --   --   HGB 6.0*   < > 8.1* 7.1* 6.5* 7.7* 8.1*  HCT 22.2*   < > 27.3* 23.7* 19.0* 25.9* 27.5*  MCV 82.8  --  81.0 80.6  --   --  81.6  PLT 273  --  263 253  --   --  256   < > = values in this interval not displayed.   Basic Metabolic Panel: Recent Labs  Lab 04/24/21 1156 04/24/21 1949 04/24/21 2015 04/25/21 0429 04/25/21 0442 04/26/21 0639  NA 137  --  139 137 139 135  K 3.6  --  3.1* 3.4* 3.3* 4.2  CL 104  --   --  110  --  107  CO2 26  --   --  22  --  22  GLUCOSE 104*  --   --  99  --  95  BUN 12  --   --  8  --  6  CREATININE 0.91  --   --  0.79  --  0.79  CALCIUM 8.6*  --   --  7.5*  --  8.0*  MG  --  1.6*  --  1.9  --  1.8  PHOS  --  2.3*  --  3.0  --  2.9   GFR: Estimated Creatinine Clearance: 144.5 mL/min (by C-G formula based on SCr of 0.79 mg/dL). Liver Function Tests: Recent Labs  Lab 04/24/21 1156 04/26/21 0639  AST 44* 20  ALT 21 19  ALKPHOS 218* 130*  BILITOT 0.2* 0.5  PROT 5.8* 4.2*  ALBUMIN 2.5* 1.7*   Recent Labs  Lab 04/25/21 1419  LIPASE 23   No  results for input(s): AMMONIA in the last 168 hours. Coagulation Profile: No results for input(s): INR, PROTIME in the last 168 hours. Cardiac Enzymes: No results for input(s): CKTOTAL, CKMB, CKMBINDEX, TROPONINI in the last 168 hours. BNP (last 3 results) No results for input(s): PROBNP in the last 8760 hours. HbA1C: No results for input(s): HGBA1C in the last 72 hours. CBG: Recent Labs  Lab 04/26/21 1513 04/26/21 1919 04/26/21 2318 04/27/21 0400 04/27/21 0815  GLUCAP 99 80 98 89 87   Lipid Profile: No results for input(s): CHOL, HDL, LDLCALC, TRIG, CHOLHDL, LDLDIRECT in the last 72 hours. Thyroid Function Tests: No results for input(s): TSH, T4TOTAL,  FREET4, T3FREE, THYROIDAB in the last 72 hours. Anemia Panel: Recent Labs    04/26/21 0639  VITAMINB12 353  FOLATE 23.0   Sepsis Labs: No results for input(s): PROCALCITON, LATICACIDVEN in the last 168 hours.  Recent Results (from the past 240 hour(s))  Culture, blood (single)     Status: None (Preliminary result)   Collection Time: 04/24/21  3:26 PM   Specimen: BLOOD RIGHT HAND  Result Value Ref Range Status   Specimen Description BLOOD RIGHT HAND  Final   Special Requests   Final    BOTTLES DRAWN AEROBIC ONLY Blood Culture adequate volume Performed at [facility], [address], [place], [place] [address]    Culture PENDING  Incomplete   Report Status PENDING  Incomplete  Blood culture (routine x 2)     Status: None (Preliminary result)   Collection Time: 04/24/21  3:26 PM   Specimen: BLOOD  Result Value Ref Range Status   Specimen Description BLOOD  Final   Special Requests NONE  Final   Culture   Final    NO GROWTH 2 DAYS Performed at [facility], [address] [place], [place] [address]    Report Status PENDING  Incomplete  Blood culture (routine x 2)     Status: None (Preliminary result)   Collection Time: 04/24/21  3:26 PM   Specimen: BLOOD  Result Value Ref Range Status   Specimen Description BLOOD  Final   Special Requests NONE  Final   Culture   Final    NO GROWTH 2 DAYS Performed at [facility], [address] [place], [place] [address]    Report Status PENDING  Incomplete  MRSA Next Gen by PCR, Nasal     Status: None   Collection Time: 04/24/21  7:18 PM   Specimen: Nasal Mucosa; Nasal Swab  Result Value Ref Range Status   MRSA by PCR Next Gen NOT DETECTED NOT DETECTED Final    Comment: (NOTE) The GeneXpert MRSA Assay (FDA approved for NASAL specimens only), is one component of a comprehensive MRSA colonization surveillance program. It is not intended to diagnose MRSA infection nor to guide or monitor treatment for MRSA infections. Test  performance is not FDA approved in patients less than 46 years old. Performed at [facility], [place] [address] [place], [place] [address]   Culture, Respiratory w Gram Stain     Status: None (Preliminary result)   Collection Time: 04/24/21  7:19 PM   Specimen: Tracheal Aspirate; Respiratory  Result Value Ref Range Status   Specimen Description TRACHEAL ASPIRATE  Final   Special Requests NONE  Final   Gram Stain   Final    RARE SQUAMOUS EPITHELIAL CELLS PRESENT ABUNDANT WBC PRESENT, PREDOMINANTLY PMN RARE GRAM POSITIVE COCCI    Culture   Final    RARE Normal  respiratory flora-no Staph aureus or Pseudomonas seen Performed at [facility] [address], [place], [place] [address]    Report Status PENDING  Incomplete  Resp Panel by RT-PCR (Flu A&B, Covid) Nasopharyngeal Swab     Status: None   Collection Time: 04/27/21  1:17 AM   Specimen: Nasopharyngeal Swab; Nasopharyngeal(NP) swabs in vial transport medium  Result Value Ref Range Status   SARS Coronavirus 2 by RT PCR NEGATIVE NEGATIVE Final    Comment: (NOTE) SARS-CoV-2 target nucleic acids are NOT DETECTED.  The SARS-CoV-2 RNA is generally detectable in upper respiratory specimens during the acute phase of infection. The lowest concentration of SARS-CoV-2 viral copies this assay can detect is 138 copies/mL. A negative result does not preclude SARS-Cov-2 infection and should not be used as the sole basis for treatment or other patient management decisions. A negative result may occur with  improper specimen collection/handling, submission of specimen other than nasopharyngeal swab, presence of viral mutation(s) within the areas targeted by this assay, and inadequate number of viral copies(<138 copies/mL). A negative result must be combined with clinical observations, patient history, and epidemiological information. The expected result is Negative.  Fact Sheet for Patients:   EntrepreneurPulse.com.au  Fact Sheet for Healthcare Providers:  IncredibleEmployment.be  This test is no t yet approved or cleared by the [place] FDA and  has been authorized for detection and/or diagnosis of SARS-CoV-2 by FDA under an Emergency Use Authorization (EUA). This EUA will remain  in effect (meaning this test can be used) for the duration of the COVID-19 declaration under Section 564(b)(1) of the Act, 21 U.S.C.section 360bbb-3(b)(1), unless the authorization is terminated  or revoked sooner.       Influenza A by PCR NEGATIVE NEGATIVE Final   Influenza B by PCR NEGATIVE NEGATIVE Final    Comment: (NOTE) The Xpert Xpress SARS-CoV-2/FLU/RSV plus assay is intended as an aid in the diagnosis of influenza from Nasopharyngeal swab specimens and should not be used as a sole basis for treatment. Nasal washings and aspirates are unacceptable for Xpert Xpress SARS-CoV-2/FLU/RSV testing.  Fact Sheet for Patients: EntrepreneurPulse.com.au  Fact Sheet for Healthcare Providers: IncredibleEmployment.be  This test is not yet approved or cleared by the [place] FDA and has been authorized for detection and/or diagnosis of SARS-CoV-2 by FDA under an Emergency Use Authorization (EUA). This EUA will remain in effect (meaning this test can be used) for the duration of the COVID-19 declaration under Section 564(b)(1) of the Act, 21 U.S.C. section 360bbb-3(b)(1), unless the authorization is terminated or revoked.  Performed at [facility], [place] [address], [place], [place] [address]      Radiology Studies: EEG adult  Result Date: 2021-05-15 Lora Havens, MD     05-15-2021  9:01 PM Patient Name: Arik Husmann MRN: [id number] Epilepsy Attending: Lora Havens Referring Physician/Provider: Dr Audria Nine Date: 2021/05/15 Duration: 24.40 mins Patient history: 25yo M with h/o epilepsy  presented with breakthrough seizure. EEG to evaluate for seizure Level of alertness: Awake AEDs during EEG study: perampanel, VPA, Ativan Technical aspects: This EEG study was done with scalp electrodes positioned according to the 10-20 International system of electrode placement. Electrical activity was acquired at a sampling rate of 500Hz  and reviewed with a high frequency filter of 70Hz  and a low frequency filter of 1Hz . EEG data were recorded continuously and digitally stored. Description: The posterior dominant rhythm consists of 9-10 Hz activity of moderate voltage (25-35 uV) seen predominantly in posterior  head regions, symmetric and reactive to eye opening and eye closing. IMPRESSION: This study is within normal limits. No seizures or epileptiform discharges were seen throughout the recording. Lora Havens   ECHOCARDIOGRAM COMPLETE  Result Date: 04/25/2021    ECHOCARDIOGRAM REPORT   Patient Name:   SHAHIR KAREN Date of Exam: 04/25/2021 Medical Rec #:  [id number]       Height:       70.0 in Accession #:    [id number]      Weight:       157.6 lb Date of Birth:  10/24/1995       BSA:          1.886 m Patient Age:    25 years        BP:           96/57 mmHg Patient Gender: M               HR:           74 bpm. Exam Location:  Inpatient Procedure: 2D Echo, Cardiac Doppler, Color Doppler and Intracardiac            Opacification Agent Indications:    Murmur  History:        Patient has no prior history of Echocardiogram examinations.                 Signs/Symptoms:Edema, Resp. failure, Polysubstance abuse and                 Murmur.  Sonographer:    Dustin Flock RDCS Referring Phys: [place]  1. Left ventricular ejection fraction, by estimation, is 55 to 60%. The left ventricle has normal function. The left ventricle has no regional wall motion abnormalities. Left ventricular diastolic parameters were normal.  2. Right ventricular systolic function is normal. The right  ventricular size is normal. There is normal pulmonary artery systolic pressure.  3. The mitral valve is normal in structure. Trivial mitral valve regurgitation. No evidence of mitral stenosis.  4. The aortic valve is tricuspid. Aortic valve regurgitation is not visualized. No aortic stenosis is present.  5. The inferior vena cava is normal in size with greater than 50% respiratory variability, suggesting right atrial pressure of 3 mmHg. FINDINGS  Left Ventricle: Left ventricular ejection fraction, by estimation, is 55 to 60%. The left ventricle has normal function. The left ventricle has no regional wall motion abnormalities. Definity contrast agent was given IV to delineate the left ventricular  endocardial borders. The left ventricular internal cavity size was normal in size. There is no left ventricular hypertrophy. Left ventricular diastolic parameters were normal. Right Ventricle: The right ventricular size is normal. No increase in right ventricular wall thickness. Right ventricular systolic function is normal. There is normal pulmonary artery systolic pressure. The tricuspid regurgitant velocity is 2.21 m/s, and  with an assumed right atrial pressure of 3 mmHg, the estimated right ventricular systolic pressure is 93.2 mmHg. Left Atrium: Left atrial size was normal in size. Right Atrium: Right atrial size was normal in size. Pericardium: There is no evidence of pericardial effusion. Mitral Valve: The mitral valve is normal in structure. Trivial mitral valve regurgitation. No evidence of mitral valve stenosis. Tricuspid Valve: The tricuspid valve is normal in structure. Tricuspid valve regurgitation is trivial. No evidence of tricuspid stenosis. Aortic Valve: The aortic valve is tricuspid. Aortic valve regurgitation is not visualized. No aortic stenosis is present. Pulmonic Valve: The pulmonic valve was  normal in structure. Pulmonic valve regurgitation is not visualized. No evidence of pulmonic stenosis. Aorta:  The aortic root is normal in size and structure. Venous: The inferior vena cava is normal in size with greater than 50% respiratory variability, suggesting right atrial pressure of 3 mmHg. IAS/Shunts: No atrial level shunt detected by color flow Doppler.  LEFT VENTRICLE PLAX 2D LVIDd:         3.40 cm   Diastology LVIDs:         2.34 cm   LV e' medial:    12.10 cm/s LV PW:         1.00 cm   LV E/e' medial:  6.1 LV IVS:        1.12 cm   LV e' lateral:   12.60 cm/s LVOT diam:     2.30 cm   LV E/e' lateral: 5.8 LV SV:         76 LV SV Index:   40 LVOT Area:     4.15 cm  RIGHT VENTRICLE RV Basal diam:  4.43 cm RV S prime:     8.92 cm/s TAPSE (M-mode): 1.6 cm LEFT ATRIUM           Index LA diam:      3.40 cm 1.80 cm/m LA Vol (A4C): 34.8 ml 18.45 ml/m  AORTIC VALVE LVOT Vmax:   92.00 cm/s LVOT Vmean:  65.300 cm/s LVOT VTI:    0.183 m  AORTA Ao Root diam: 2.50 cm MITRAL VALVE               TRICUSPID VALVE MV Area (PHT): 4.10 cm    TR Peak grad:   19.5 mmHg MV Decel Time: 185 msec    TR Vmax:        221.00 cm/s MV E velocity: 73.70 cm/s MV A velocity: 55.30 cm/s  SHUNTS MV E/A ratio:  1.33        Systemic VTI:  0.18 m                            Systemic Diam: 2.30 cm Jenkins Rouge MD Electronically signed by Jenkins Rouge MD Signature Date/Time: 04/25/2021/10:39:56 AM    Final      LOS: 3 days   [name], MD [facility]  04/27/2021, 8:24 AM

## 2021-04-27 NOTE — Progress Notes (Signed)
Inpatient Rehab Admissions Coordinator:  ? ?Per therapy recommendations,  patient was screened for CIR candidacy by Shakoya Gilmore, MS, CCC-SLP. At this time, Pt. Appears to be a a potential candidate for CIR. I will place   order for rehab consult per protocol for full assessment. Please contact me any with questions. ? ?Kimberlea Schlag, MS, CCC-SLP ?Rehab Admissions Coordinator  ?[phone] (celll) ?[phone] (office) ? ?
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
## 2021-04-27 NOTE — Progress Notes (Signed)
Speech Language Pathology Treatment: Dysphagia  Patient Details Name: Vincent Green MRN: [id number] DOB: 03-05-1996 Today's Date: 04/27/2021 Time: 1425-1440 SLP Time Calculation (min) (ACUTE ONLY): 15 min  Assessment / Plan / Recommendation Clinical Impression  Pt seen at bedside for follow up after BSE completed 04/26/21. Pt exhibited no overt s/s aspiration of po trials, including multiple pills RN had provided. Recommend continuing with regular diet/thin liquids. No further ST intervention for swallowing recommended at this time. Please reconsult if needs arise.   HPI HPI: Vincent Green is a 25yrold male [facility] worsening lower extremity swelling and pain for two days. While in ER, patient became altered with pinpoint pupils for which given Narcan with improvement. However, noted to have witnessed seizure - becoming hypoxic, suspect aspiration requiring intubation for airway protection. [facility] Ext[facility]ated 11/8 at 9 am. CXR 11/6: "Bilateral lower lobe airspace opacities progressed since the  prior radiograph." Head CT 11/6 negative. Pt with PMHx of PTSD, depression/anxiety, seizure disorder, microcytic anemia, and polysubstance use with ?murmur      SLP Plan  Discharge SLP treatment due to goals met      Recommendations for follow up therapy are one component of a multi-disciplinary discharge planning process, led by the attending physician.  Recommendations may be updated based on patient status, additional functional criteria and insurance authorization.    Recommendations  Diet recommendations: Regular;Thin liquid Liquids provided via: Straw;Cup Medication Administration: Whole meds with liquid Supervision: Patient able to self feed Compensations: Slow rate;Small sips/bites Postural Changes and/or Swallow Maneuvers: Seated upright 90 degrees                Oral Care Recommendations: Oral care  BID Follow Up Recommendations: No SLP follow up Assistance recommended at discharge: None SLP Visit Diagnosis: Dysphagia, oropharyngeal phase (R13.12) Plan: Discharge SLP treatment due to goals met       GClayton BQuentin Ore [facility] CRobbinsSpeech Language Pathologist Office: [phone] BShonna Chock 04/27/2021, 3:07 PM

## 2021-04-27 NOTE — Progress Notes (Signed)
PHARMACIST - PHYSICIAN COMMUNICATION  DR:   Jonathon Bellows  CONCERNING: IV to Oral Route Change Policy  RECOMMENDATION: This patient is receiving thiamine and folic acid by the intravenous route.  Based on criteria approved by the Pharmacy and Therapeutics Committee, the intravenous medication(s) is/are being converted to the equivalent oral dose form(s).   DESCRIPTION: These criteria include: The patient is eating (either orally or via tube) and/or has been taking other orally administered medications for a least 24 hours The patient has no evidence of active gastrointestinal bleeding or impaired GI absorption (gastrectomy, short bowel, patient on TNA or NPO).  If you have questions about this conversion, please contact the Pharmacy Department  []   [phone] )  ( [phone] []   [phone] )  [facility] [x]   [phone] )  [facility] []   [phone] )  [facility] []   [phone] )  [facility]

## 2021-04-27 NOTE — Evaluation (Signed)
Physical Therapy Evaluation Patient Details Name: Vincent Green MRN: [id number] DOB: 1996/04/02 Today's Date: 04/27/2021  History of Present Illness  Vincent Green, 25 y.o. male with PMH of PTSD, depression/anxiety, seizure disorder, microcytic anemia, and polysubstance use admitted for acute metabolic toxic encephalopathy with breakthrough seizure in setting of polysubstance use at [facility] ED where he was intubated for acute hypoxic respiratory failure initially with concerns of aspiration pneumonitis, transferred to [facility], ICU.  Extubated 04/26/21.  Clinical Impression  Patient presents with decreased mobility due to generalized weakness and focal L LE weakness.  He previously was independent living with his dad, currently mod A overall for stand step to recliner with L LE buckling under him.  Patient eager to go home and may progress home if dad able to assist physically and pt has wheelchair, however feel he may benefit from acute inpatient rehab prior to d/c home.  PT to continue to follow acutely.     Recommendations for follow up therapy are one component of a multi-disciplinary discharge planning process, led by the attending physician.  Recommendations may be updated based on patient status, additional functional criteria and insurance authorization.  Follow Up Recommendations Acute inpatient rehab (3hours/day)    Assistance Recommended at Discharge Intermittent Supervision/Assistance  Functional Status Assessment Patient has had a recent decline in their functional status and demonstrates the ability to make significant improvements in function in a reasonable and predictable amount of time.  Equipment Recommendations  BSC/3in1;Wheelchair (measurements PT)    Recommendations for Other Services       Precautions / Restrictions Precautions Precautions: Fall Precaution Comments: L LE weakness, seizure      Mobility  Bed Mobility Overal bed mobility: Needs Assistance Bed  Mobility: Supine to Sit     Supine to sit: HOB elevated;Mod assist     General bed mobility comments: assist for L LE and to scoot hips, cues for hand on rail and for guiding trunk    Transfers Overall transfer level: Needs assistance Equipment used: Rolling walker (2 wheels) Transfers: Sit to/from Stand;Bed to chair/wheelchair/BSC Sit to Stand: Mod assist;From elevated surface   Step pivot transfers: Mod assist       General transfer comment: lifting help to stand from EOB, hand on RW and one on bed with cues; assist for walker management, keeping from LOB with L LE buckling cues for UE support with walker and to step back to chair    Ambulation/Gait                  Stairs            Wheelchair Mobility    Modified Rankin (Stroke Patients Only)       Balance Overall balance assessment: Needs assistance   Sitting balance-Leahy Scale: Fair Sitting balance - Comments: weak core with flexed posture head, neck and shoulders and posterior pelvic tilt   Standing balance support: Bilateral upper extremity supported;Reliant on assistive device for balance Standing balance-Leahy Scale: Poor                               Pertinent Vitals/Pain Pain Assessment: Faces Faces Pain Scale: Hurts a little bit Pain Location: generalized Pain Descriptors / Indicators: Discomfort;Grimacing Pain Intervention(s): Monitored during session;Repositioned    Home Living Family/patient expects to be discharged to:: Private residence Living Arrangements: Parent Available Help at Discharge: Family;Available 24 hours/day Type of Home: House Home Access: Level entry  Home Layout: One level Home Equipment: Agricultural consultant (2 wheels)      Prior Function Prior Level of Function : Independent/Modified Independent             Mobility Comments: reports was independent previously       Hand Dominance        Extremity/Trunk Assessment   Upper  Extremity Assessment Upper Extremity Assessment: Defer to OT evaluation    Lower Extremity Assessment Lower Extremity Assessment: LLE deficits/detail;RLE deficits/detail RLE Deficits / Details: AROM WFL, strength hip flexion 2-/5, knee extension 4-/5, ankle DF at least 3/5 with edema in foot and ankle LLE Deficits / Details: AAROM WFL, strength hip flexion 2-/5, knee extension 2-/5, ankle DF at least 3/5 with edema in ankle and foot LLE Sensation: decreased light touch    Cervical / Trunk Assessment Cervical / Trunk Assessment: Kyphotic;Other exceptions Cervical / Trunk Exceptions: head and neck flexion, weak core  Communication   Communication: Expressive difficulties (mild dysarthria, had to have him repeat several times)  Cognition Arousal/Alertness: Awake/alert Behavior During Therapy: WFL for tasks assessed/performed Overall Cognitive Status: Within Functional Limits for tasks assessed                                          General Comments General comments (skin integrity, edema, etc.): asking if family can bring in Ramen Noodles, obtained phone and menu for pt    Exercises     Assessment/Plan    PT Assessment Patient needs continued PT services  PT Problem List Decreased strength;Decreased balance;Decreased mobility;Decreased knowledge of precautions;Decreased safety awareness;Decreased activity tolerance;Decreased knowledge of use of DME       PT Treatment Interventions DME instruction;Balance training;Gait training;Functional mobility training;Patient/family education;Therapeutic activities;Therapeutic exercise;Wheelchair mobility training    PT Goals (Current goals can be found in the Care Plan section)  Acute Rehab PT Goals Patient Stated Goal: to go home asap PT Goal Formulation: With patient Time For Goal Achievement: 05/11/21 Potential to Achieve Goals: Good    Frequency Min 3X/week   Barriers to discharge        Co-evaluation                AM-PAC PT "6 Clicks" Mobility  Outcome Measure Help needed turning from your back to your side while in a flat bed without using bedrails?: A Lot Help needed moving from lying on your back to sitting on the side of a flat bed without using bedrails?: A Lot Help needed moving to and from a bed to a chair (including a wheelchair)?: A Lot Help needed standing up from a chair using your arms (e.g., wheelchair or bedside chair)?: A Lot Help needed to walk in hospital room?: Total Help needed climbing 3-5 steps with a railing? : Total 6 Click Score: 10    End of Session Equipment Utilized During Treatment: Gait belt Activity Tolerance: Patient tolerated treatment well Patient left: in chair;with call bell/phone within reach;with chair alarm set   PT Visit Diagnosis: Other abnormalities of gait and mobility (R26.89);Muscle weakness (generalized) (M62.81)    Time: 4584-8350 PT Time Calculation (min) (ACUTE ONLY): 28 min   Charges:   PT Evaluation $PT Eval Moderate Complexity: 1 Mod PT Treatments $Therapeutic Activity: 8-22 mins        Sheran Lawless, PT Acute Rehabilitation Services Pager:[phone] [phone] 04/27/2021   [name] 04/27/2021, 12:03 PM

## 2021-04-27 NOTE — Evaluation (Signed)
Occupational Therapy Evaluation Patient Details Name: Vincent Green MRN: [id number] DOB: [birth year] Today's Date: 04/27/2021   History of Present Illness Vincent Green, 25 y.o. male with PMH of PTSD, depression/anxiety, seizure disorder, microcytic anemia, and polysubstance use admitted for acute metabolic toxic encephalopathy with breakthrough seizure in setting of polysubstance use at [facility] ED where he was intubated for acute hypoxic respiratory failure initially with concerns of aspiration pneumonitis, transferred to [facility], ICU.  Extubated 04/26/21.   Clinical Impression   Vincent Green was mod I PTA, he lives with his dad in a 1 level home, level entry. Vincent Green presents with bilat hand weakness, poor coordination and swelling. He requires up to set up for upper ADLs and mod A for lower ADLs. He has some mild LLE buckling, and poor insight to his deficts and safety. Pt perseverated on his pain and his need of methadone and was difficult to re-direct. Slow speech with some work finding difficulties noted throughout. Pt will benefit from continued OT acutely. Pt would greatly benefit from CIR at d/c for intense therapy; however pt wanting to go home at this time with direct physical assist from his dad.      Recommendations for follow up therapy are one component of a multi-disciplinary discharge planning process, led by the attending physician.  Recommendations may be updated based on patient status, additional functional criteria and insurance authorization.   Follow Up Recommendations  Acute inpatient rehab (3hours/day)    Assistance Recommended at Discharge    Functional Status Assessment  Patient has had a recent decline in their functional status and demonstrates the ability to make significant improvements in function in a reasonable and predictable amount of time.  Equipment Recommendations  None recommended by OT    Recommendations for Other Services Rehab consult      Precautions / Restrictions Precautions Precautions: Fall Precaution Comments: L LE weakness, seizure      Mobility Bed Mobility Overal bed mobility: Needs Assistance Bed Mobility: Supine to Sit     Supine to sit: HOB elevated;Mod assist     General bed mobility comments: pt in chair & returned to chair    Transfers Overall transfer level: Needs assistance Equipment used: Rolling walker (2 wheels) Transfers: Sit to/from Stand;Bed to chair/wheelchair/BSC Sit to Stand: Mod assist     Step pivot transfers: Mod assist     General transfer comment: mod A to rise with verbal cues for hand placement + incrased time. noted LLE weakness with mild buckling      Balance Overall balance assessment: Needs assistance   Sitting balance-Leahy Scale: Fair Sitting balance - Comments: weak core with flexed posture head, neck and shoulders and posterior pelvic tilt   Standing balance support: Bilateral upper extremity supported;Reliant on assistive device for balance Standing balance-Leahy Scale: Poor                             ADL either performed or assessed with clinical judgement   ADL Overall ADL's : Needs assistance/impaired Eating/Feeding: Set up;Sitting   Grooming: Set up;Sitting   Upper Body Bathing: Set up;Sitting   Lower Body Bathing: Sit to/from stand;Moderate assistance   Upper Body Dressing : Set up;Sitting   Lower Body Dressing: Sit to/from stand;Moderate assistance   Toilet Transfer: Moderate assistance;Ambulation;Rolling walker (2 wheels) Toilet Transfer Details (indicate cue type and reason): LLE weakness, mild buckling Toileting- Clothing Manipulation and Hygiene: Min guard;Sitting/lateral lean  Functional mobility during ADLs: Moderate assistance;Rolling walker (2 wheels) General ADL Comments: pt required incrased task for all tasks, limited by poor bilat hand strength and coordionation, swelling, pain and LLE weakness     Vision  Baseline Vision/History: 0 No visual deficits Ability to See in Adequate Light: 0 Adequate       Perception     Praxis      Pertinent Vitals/Pain Pain Assessment: 0-10 Pain Score: 10-Worst pain ever Faces Pain Scale: Hurts a little bit Pain Location: generalized Pain Descriptors / Indicators: Discomfort;Grimacing Pain Intervention(s): Limited activity within patient's tolerance;Monitored during session     Hand Dominance     Extremity/Trunk Assessment Upper Extremity Assessment Upper Extremity Assessment: RUE deficits/detail;LUE deficits/detail RUE Deficits / Details: poor incoordination, 3/5 grip strength, shoudler and elbow 4+/5 and full AROM. increased edema noted RUE Sensation: WNL RUE Coordination: decreased fine motor LUE Deficits / Details: poor incoordination, 3/5 grip strength, shoudler and elbow 4+/5 and full AROM. incrased edema noted LUE Sensation: WNL LUE Coordination: decreased fine motor   Lower Extremity Assessment Lower Extremity Assessment: Defer to PT evaluation RLE Deficits / Details: AROM WFL, strength hip flexion 2-/5, knee extension 4-/5, ankle DF at least 3/5 with edema in foot and ankle LLE Deficits / Details: AAROM WFL, strength hip flexion 2-/5, knee extension 2-/5, ankle DF at least 3/5 with edema in ankle and foot LLE Sensation: decreased light touch   Cervical / Trunk Assessment Cervical / Trunk Assessment: Kyphotic;Other exceptions Cervical / Trunk Exceptions: head and neck flexion, weak core. 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            Home Living Family/patient expects to be discharged to:: Private residence Living Arrangements: Parent Available Help at Discharge: Family;Available 24 hours/day Type of Home: House Home Access: Level entry     Home Layout: One level     Bathroom Shower/Tub: [phone]: Standard     Home Equipment: Chief Strategy Officer (2 wheels);Shower seat          Prior Functioning/Environment Prior Level of Function : Independent/Modified Independent             Mobility Comments: reports was independent previously ADLs Comments: not cleared to drive        OT Problem List: Decreased strength;Decreased range of motion;Decreased activity tolerance;Impaired balance (sitting and/or standing);Decreased coordination;Decreased safety awareness;Decreased knowledge of use of DME or AE;Decreased knowledge of precautions;Pain;Impaired UE functional use      OT Treatment/Interventions: Self-care/ADL training;Therapeutic exercise;Therapeutic activities;Balance training;Patient/family education;DME and/or AE instruction    OT Goals(Current goals can be found in the care plan section) Acute Rehab OT Goals Patient Stated Goal: home OT Goal Formulation: With patient Time For Goal Achievement: 05/11/21 Potential to Achieve Goals: Fair ADL Goals Pt Will Perform Lower Body Bathing: with modified independence;sit to/from stand Pt Will Perform Lower Body Dressing: with modified independence;sit to/from stand Pt Will Transfer to Toilet: with modified independence;ambulating Pt Will Perform Tub/Shower Transfer: with modified independence;ambulating;shower seat Pt/caregiver will Perform Home Exercise Program: Increased strength;Both right and left upper extremity;With  written HEP provided  OT Frequency: Min 2X/week    AM-PAC OT "6 Clicks" Daily Activity     Outcome Measure Help from another person eating meals?: A Little Help from another person taking  care of personal grooming?: A Little Help from another person toileting, which includes using toliet, bedpan, or urinal?: A Lot Help from another person bathing (including washing, rinsing, drying)?: A Lot Help from another person to put on and taking off regular upper body clothing?: A Little Help from another person to put on and taking off regular lower body clothing?: A Lot 6 Click Score: 15   End of Session Equipment Utilized During Treatment: Rolling walker (2 wheels) Nurse Communication: Mobility status  Activity Tolerance: Patient limited by pain Patient left: in chair;with call bell/phone within reach;with chair alarm set  OT Visit Diagnosis: Unsteadiness on feet (R26.81);Other abnormalities of gait and mobility (R26.89);Repeated falls (R29.6);Muscle weakness (generalized) (M62.81);Pain                Time: 9038-3338 OT Time Calculation (min): 23 min Charges:  OT Evaluation $OT Eval Moderate Complexity: 1 Mod OT Treatments $Therapeutic Activity: 8-22 mins   Yarelli Decelles A Sagrario Lineberry 04/27/2021, 12:39 PM

## 2021-04-27 NOTE — Progress Notes (Signed)
Received from 76M.  Oriented to room and surroundings.

## 2021-04-28 DIAGNOSIS — D509 Iron deficiency anemia, unspecified: Secondary | ICD-10-CM

## 2021-04-28 LAB — BASIC METABOLIC PANEL
Anion gap: 6 (ref 5–15)
BUN: <5 mg/dL — ABNORMAL LOW (ref 6–20)
CO2: 23 mmol/L (ref 22–32)
Calcium: 8.2 mg/dL — ABNORMAL LOW (ref 8.9–10.3)
Chloride: 108 mmol/L (ref 98–111)
Creatinine, Ser: 0.71 mg/dL (ref 0.61–1.24)
GFR, Estimated: >60 mL/min (ref >60)
Glucose, Bld: 74 mg/dL (ref 70–99)
Potassium: 3.8 mmol/L (ref 3.5–5.1)
Sodium: 137 mmol/L (ref 135–145)

## 2021-04-28 LAB — GLUCOSE, CAPILLARY
Glucose-Capillary: 103 mg/dL — ABNORMAL HIGH (ref 70–99)
Glucose-Capillary: 146 mg/dL — ABNORMAL HIGH (ref 70–99)
Glucose-Capillary: 63 mg/dL — ABNORMAL LOW (ref 70–99)
Glucose-Capillary: 64 mg/dL — ABNORMAL LOW (ref 70–99)
Glucose-Capillary: 65 mg/dL — ABNORMAL LOW (ref 70–99)
Glucose-Capillary: 74 mg/dL (ref 70–99)
Glucose-Capillary: 79 mg/dL (ref 70–99)
Glucose-Capillary: 82 mg/dL (ref 70–99)
Glucose-Capillary: 85 mg/dL (ref 70–99)
Glucose-Capillary: 96 mg/dL (ref 70–99)

## 2021-04-28 LAB — CORTISOL-AM, BLOOD: Cortisol - AM: 14.5 ug/dL (ref 6.7–22.6)

## 2021-04-28 LAB — CBC
HCT: 26 % — ABNORMAL LOW (ref 39.0–52.0)
Hemoglobin: 7.7 g/dL — ABNORMAL LOW (ref 13.0–17.0)
MCH: 24.1 pg — ABNORMAL LOW (ref 26.0–34.0)
MCHC: 29.6 g/dL — ABNORMAL LOW (ref 30.0–36.0)
MCV: 81.5 fL (ref 80.0–100.0)
Platelets: 254 10*3/uL (ref 150–400)
RBC: 3.19 MIL/uL — ABNORMAL LOW (ref 4.22–5.81)
RDW: 18.3 % — ABNORMAL HIGH (ref 11.5–15.5)
WBC: 7.2 10*3/uL (ref 4.0–10.5)
nRBC: 0 % (ref 0.0–0.2)

## 2021-04-28 LAB — VITAMIN B1: Vitamin B1 (Thiamine): 171.7 nmol/L (ref 66.5–200.0)

## 2021-04-28 LAB — T3, FREE: T3, Free: 2 pg/mL (ref 2.0–4.4)

## 2021-04-28 MED ORDER — PRAZOSIN HCL 1 MG PO CAPS
1.0000 mg | ORAL_CAPSULE | Freq: Every day | ORAL | Status: DC
  Administered 2021-04-28 – 2021-05-03 (×6): 1 mg via ORAL
  Filled 2021-04-28 (×6): qty 1

## 2021-04-28 MED ORDER — SODIUM CHLORIDE 0.9 % IV SOLN
6.2500 mg | Freq: Four times a day (QID) | INTRAVENOUS | Status: DC | PRN
  Administered 2021-04-29: 6.25 mg via INTRAVENOUS
  Filled 2021-04-28 (×2): qty 0.25

## 2021-04-28 MED ORDER — FUROSEMIDE 10 MG/ML IJ SOLN
20.0000 mg | Freq: Once | INTRAMUSCULAR | Status: AC
  Administered 2021-04-28: 20 mg via INTRAVENOUS
  Filled 2021-04-28: qty 4

## 2021-04-28 MED ORDER — ENSURE ENLIVE PO LIQD
237.0000 mL | Freq: Three times a day (TID) | ORAL | Status: DC
Start: 2021-04-28 — End: 2021-05-03
  Administered 2021-04-28 – 2021-05-03 (×13): 237 mL via ORAL

## 2021-04-28 MED ORDER — DEXTROSE-NACL 5-0.9 % IV SOLN: INTRAVENOUS | Status: DC

## 2021-04-28 NOTE — Progress Notes (Signed)
Inpatient Rehab Admissions Coordinator:   Met with patient at bedside to discuss CIR recommendations. Pt demos hyperverbosity throughout my assessment, and I was able to share very little about the CIR program with him because of this.  Pt did state that he felt like a short stay on rehab wouldn't be bad but that he really would rather d/c home. I left a message with pt's father to discuss recommendations and dispo.  Will continue to follow for now.    Shann Medal, PT, DPT Admissions Coordinator [phone] 04/28/21  2:54 PM

## 2021-04-28 NOTE — Plan of Care (Signed)

## 2021-04-28 NOTE — Progress Notes (Signed)
Hypoglycemic Event  CBG: 65  Treatment: 4 oz juice/soda  Symptoms: None  Follow-up CBG: Time:0435 CBG Result:82  Possible Reasons for Event: Inadequate meal intake  Comments/MD notified: no symptoms or complaints at this time or even during BS of 65.  [name] feels better.  Did receive more juice and some peanut butter crackers per request.    Vincent Green

## 2021-04-28 NOTE — Consult Note (Signed)
[facility] Consult   Reason for Consult:  Anxiety and depression "on multiple psych meds but seems to not be taking them at home." Referring Physician:  [name], MD Patient Identification: Vincent Green MRN:  [id number] Principal Diagnosis: <principal problem not specified> Diagnosis:  Active Problems:   Microcytic anemia   Respiratory arrest ([place])   Acute hypoxemic respiratory failure ([place])  Assessment  Vincent Green is a 25 y.o. male admitted medically  04/24/2021 11:31 AM for cellulitis and seizure.Patient carries the psychiatric diagnoses of ADHD, PTSD, insomnia and anxiety  and has a past medical history of   Seizure disorder and scoliosis .Psychiatry was consulted for concern for patient medication regimen.   He meets criteria for PTSD and anxiety based on hx per father and EMR.  Outpatient psychotropic medications include Valium for Anxiety and Ambien for Insomnia  and historically he has had a good response to these medications. He was mostly compliant with medications prior to admission as evidenced by dad endorsing not being very concerned, but also endorsing that dad still managed patient's adherence to his Depakote for seizures. On initial examination, patient appeared oversedated and very drowsy. We plan to recommend psychotropic interventions to assist with patient's anxiety and reported mood dysregulation noted by dad .    On assessment today patient is much more alert and appears near or at reported baseline. Patient displayed very good concentration (did well with MOYB and quarter calculations), insight and judgement. At this time patient's medication regimen appears to be the result of chronic care with his Neurology team and patient appears to be very sensitive (with a hx of adverse responses) to medication adjustments. However per patient's reports during assessment today patient continues to be significantly affected by PTSD related symptoms. Patient is not  currently being treated with optimal intervention for PTSD related symptoms and patient endorsed a wish for this. Patient also reports concerning symptoms for Bipolar disorder; however reported to provider today that his OP Neurologist is titrating him off of this medication due to adverse side effects. Due to patient's complex medical regimen and sensitivity, we would suggest that patient be followed by outpatient psychiatry for possible tx for Bipolar disorder. Third, patient endorses a hx of abnormal swelling that is debilitating and contributing to his decreased interactions with others. Patient endorses a diet with essentially no protein and his Albumin was 1.7. Patient's abnormal labs and cyclical edematous presentation amy be 2/2 lack of protein. Patient should have dietary consult because, continued lack of nutrients will lead to decline in cognitive function.   Labs Reviewed: TSH- 2.905, T4-1.07, T3- pending, RPR- neg, Lipase- WNL, CBC- Hgb 8.1, HCT 27.5, CMP- Albumin 1.7, T protein 4.2, ALP 130 UDS + (THC, Benzos[prescribed], Amphetamines  [prescribed])   Plan Seizure disorder PTSD Anxiety - Would recommend Depakote 264m daily and 750QHS, seizures and concern for possible Bipolar disorder. Prefer to have larger dose at night due to sedation side effect - Continue Valium 564mBID, concerned that patient may have been post-ictal this AM, patient has a hx of seizures on downward titration of Benzos - Start Prazosin 39m68m Recommend consult to SW for referrals to OP Psych  Malnutrition - Recommend dietary consult   Safety  At this time patient appears to be of minimal risk of harm to himself. This is based off of patient's presentation and collateral from father.  -Recommend routine observation   Dispo - Per primary   Guardian - Per father, patient is legally his own  guardian Thank yo for this consult. We will continue to follow.  Total Time spent with patient: 45  minutes  Subjective:   Vincent Green is a 25 y.o. male patient admitted with PMHx of PTSD, depression/anxiety, seizure disorder, microcytic anemia, and polysubstance use with ?murmur who initially presented to [facility] ER on 11/6 with worsening lower extremity swelling and pain for two days and transferred to [facility] ICU after became altered with pinpoint pupils for which given Narcan with improvement. However, noted to have witnessed seizure - becoming hypoxic, suspect aspiration requiring intubation for airway protection.  HPI:   On assessment today patient is AOx4. Patient is laying in bed with his genitals exposed but overall appears comfortable and not concerned about his gown position. Patient reports he does not recall seeing psychiatry yesterday, but is happy to be seen today. Patient reports that slept well last night and reports that he believes that this is due to the fact "I didn't sleep for a couple of days." Patient reports that prior to coming into the hospital he had been "bed-ridden for almost 2 weeks because my legs were so swollen." Patient reports that his father took care of him and patient was urinating into an old Addison bottle. Patient reports that eventually he became able to walk around with a standing cane. Patient reports that once he became able to walk around with the cane he suddenly felt that he had a lot of energy, more reckless feeling, and did not sleep for a few days straight. Patient reports he stayed up playing XBOX. Patient reports that similar episodes of staying awake for days and feeling very energetic have occurred in the past, but the swelling of his extremities has only been going on the past 3 months.   Patient reports that he is compliant with his medications and endorses that he believed that his Vyvanse for ADHD may have contributed to his sudden wanting to stay awake, but endorses that he takes his Vyavanse regularly and the periods of staying awake  for days are episodic. Patient endorses very good insght into his medications and a good level of their purposes. Patient reports that his Lyrica is for partial seizures, his Sabino Niemann is a new anti-seizure medication, he was being titrated down on his Depakote for seizures, because he was having migraines thought to be an adverse effect, and his Methadone is for spinal pain. Patient reports that he does not really wish to take the liquid form of Methadone, but feels that it is helpful. Patient also reports that he does not use opioids and offers his urine. Patient made aware that a UDS has already been done and that he was correct no opioids were discovered. Patient also endorses that he feels his Abilify has been helpful for insomnia and recalls taking Seroquel in the past and being unable to speak with this medication (sounds like possible dystonic rxn?)  Patient denies SI, HI, and AVH. Patient reports that he was formally dx with PTSD and Severe anxiety as a minor by a psychiatrist. Patient reports that he continues to have nightmares, hyperarousal, hypervigilance and endorses avoidance all 2.2 to his trauma. Patient reports that he would love to make friends and be less isolative but is afraid that others will be turned off by his jumpy reactions to loud noises. Patient reports that he has less intrusive thoughts and depressive episodes but recalls that he had a severe depressive episode with intrusive thoughts related to his trauma at age  16. Patient reports that during this time frame his hygiene was very poor and his teeth rotted leading to all of them being pulled. Patient reports that due to this and unrelated esophageal issues his diet is strictly Ramen and Spinach. Patient reports that he began smoking at age 42 yo to cope with his trama.   Past Psychiatric History: PTSD and Severe anxiety  Risk to Self:   Risk to Others:   Prior Inpatient Therapy:   Prior Outpatient Therapy:    Past Medical  History:  Past Medical History:  Diagnosis Date  . ADHD   . Anxiety   . Anxiety   . Chronic dental pain   . COPD (chronic obstructive pulmonary disease) ([place])   . Insomnia   . PTSD (post-traumatic stress disorder)   . PTSD (post-traumatic stress disorder)   . Scoliosis   . Seizures ([facility])    benzo-seizure. last seizure 01/2019 per pt (documented 08/12/19)  . Tachycardia     Past Surgical History:  Procedure Laterality Date  . CHOANAL ATRESIA REPAIR    . NOSE SURGERY    . SKIN GRAFT     Family History:  Family History  Problem Relation Age of Onset  . Other Mother 63       04/2019 autopsy pending  . GER disease Paternal Grandmother   . Colon cancer Neg Hx    Family Psychiatric  History: Mom- Schizophrenia vs Schizoaffective vs Bipolar (dad knows it was one of these and she was seeing a psychiatrist) cocaine abuse (deceased) Social History:  Social History   Substance and Sexual Activity  Alcohol Use No     Social History   Substance and Sexual Activity  Drug Use Not Currently  . Types: Benzodiazepines, Marijuana    Social History   Socioeconomic History  . Marital status: Single    Spouse name: Not on file  . Number of children: Not on file  . Years of education: Not on file  . Highest education level: Not on file  Occupational History  . Not on file  Tobacco Use  . Smoking status: Every Day    Packs/day: 0.50    Types: Cigarettes  . Smokeless tobacco: Never  . Tobacco comments:    smokes 6 cigarettes daily  Vaping Use  . Vaping Use: Former  Substance and Sexual Activity  . Alcohol use: No  . Drug use: Not Currently    Types: Benzodiazepines, Marijuana  . Sexual activity: Not on file  Other Topics Concern  . Not on file  Social History Narrative  . Not on file   Social Determinants of Health   Financial Resource Strain: Not on file  Food Insecurity: Not on file  Transportation Needs: Not on file  Physical Activity: Not on file  Stress: Not on  file  Social Connections: Not on file   Additional Social History:    Allergies:   Allergies  Allergen Reactions  . Tessalon [Benzonatate] Swelling    Throat swelling    Labs:  Results for orders placed or performed during the hospital encounter of 04/24/21 (from the past 48 hour(s))  Glucose, capillary     Status: None   Collection Time: 04/26/21  3:13 PM  Result Value Ref Range   Glucose-Capillary 99 70 - 99 mg/dL    Comment: Glucose reference range applies only to samples taken after fasting for at least 8 hours.  Glucose, capillary     Status: None   Collection Time: 04/26/21  7:19 PM  Result Value Ref Range   Glucose-Capillary 80 70 - 99 mg/dL    Comment: Glucose reference range applies only to samples taken after fasting for at least 8 hours.  Glucose, capillary     Status: None   Collection Time: 04/26/21 11:18 PM  Result Value Ref Range   Glucose-Capillary 98 70 - 99 mg/dL    Comment: Glucose reference range applies only to samples taken after fasting for at least 8 hours.  Resp Panel by RT-PCR (Flu A&B, Covid) Nasopharyngeal Swab     Status: None   Collection Time: 04/27/21  1:17 AM   Specimen: Nasopharyngeal Swab; Nasopharyngeal(NP) swabs in vial transport medium  Result Value Ref Range   SARS Coronavirus 2 by RT PCR NEGATIVE NEGATIVE    Comment: (NOTE) SARS-CoV-2 target nucleic acids are NOT DETECTED.  The SARS-CoV-2 RNA is generally detectable in upper respiratory specimens during the acute phase of infection. The lowest concentration of SARS-CoV-2 viral copies this assay can detect is 138 copies/mL. A negative result does not preclude SARS-Cov-2 infection and should not be used as the sole basis for treatment or other patient management decisions. A negative result may occur with  improper specimen collection/handling, submission of specimen other than nasopharyngeal swab, presence of viral mutation(s) within the areas targeted by this assay, and inadequate  number of viral copies(<138 copies/mL). A negative result must be combined with clinical observations, patient history, and epidemiological information. The expected result is Negative.  Fact Sheet for Patients:  EntrepreneurPulse.com.au  Fact Sheet for Healthcare Providers:  IncredibleEmployment.be  This test is no t yet approved or cleared by the [place] FDA and  has been authorized for detection and/or diagnosis of SARS-CoV-2 by FDA under an Emergency Use Authorization (EUA). This EUA will remain  in effect (meaning this test can be used) for the duration of the COVID-19 declaration under Section 564(b)(1) of the Act, 21 U.S.C.section 360bbb-3(b)(1), unless the authorization is terminated  or revoked sooner.       Influenza A by PCR NEGATIVE NEGATIVE   Influenza B by PCR NEGATIVE NEGATIVE    Comment: (NOTE) The Xpert Xpress SARS-CoV-2/FLU/RSV plus assay is intended as an aid in the diagnosis of influenza from Nasopharyngeal swab specimens and should not be used as a sole basis for treatment. Nasal washings and aspirates are unacceptable for Xpert Xpress SARS-CoV-2/FLU/RSV testing.  Fact Sheet for Patients: EntrepreneurPulse.com.au  Fact Sheet for Healthcare Providers: IncredibleEmployment.be  This test is not yet approved or cleared by the [place] FDA and has been authorized for detection and/or diagnosis of SARS-CoV-2 by FDA under an Emergency Use Authorization (EUA). This EUA will remain in effect (meaning this test can be used) for the duration of the COVID-19 declaration under Section 564(b)(1) of the Act, 21 U.S.C. section 360bbb-3(b)(1), unless the authorization is terminated or revoked.  Performed at [facility], [place] [address] [place], [place] [address]   Glucose, capillary     Status: None   Collection Time: 04/27/21  4:00 AM  Result Value Ref Range    Glucose-Capillary 89 70 - 99 mg/dL    Comment: Glucose reference range applies only to samples taken after fasting for at least 8 hours.  TSH     Status: None   Collection Time: 04/27/21  6:39 AM  Result Value Ref Range   TSH 2.905 0.350 - 4.500 uIU/mL    Comment: Performed by a 3rd Generation assay with a functional sensitivity of <=0.01 uIU/mL.  Performed at [facility], [place] [address] [place], [place] [address]   T4, free     Status: None   Collection Time: 04/27/21  6:39 AM  Result Value Ref Range   Free T4 1.07 0.61 - 1.12 ng/dL    Comment: (NOTE) Biotin ingestion may interfere with free T4 tests. If the results are inconsistent with the TSH level, previous test results, or the clinical presentation, then consider biotin interference. If needed, order repeat testing after stopping biotin. Performed at [facility], [place] [address], [place], [place] [address]   Glucose, capillary     Status: None   Collection Time: 04/27/21  8:15 AM  Result Value Ref Range   Glucose-Capillary 87 70 - 99 mg/dL    Comment: Glucose reference range applies only to samples taken after fasting for at least 8 hours.  Glucose, capillary     Status: None   Collection Time: 04/27/21 12:25 PM  Result Value Ref Range   Glucose-Capillary 81 70 - 99 mg/dL    Comment: Glucose reference range applies only to samples taken after fasting for at least 8 hours.  Glucose, capillary     Status: None   Collection Time: 04/27/21  3:55 PM  Result Value Ref Range   Glucose-Capillary 91 70 - 99 mg/dL    Comment: Glucose reference range applies only to samples taken after fasting for at least 8 hours.  Glucose, capillary     Status: None   Collection Time: 04/27/21  8:49 PM  Result Value Ref Range   Glucose-Capillary 82 70 - 99 mg/dL    Comment: Glucose reference range applies only to samples taken after fasting for at least 8 hours.  Glucose, capillary     Status: None   Collection Time: 04/28/21 12:54  AM  Result Value Ref Range   Glucose-Capillary 74 70 - 99 mg/dL    Comment: Glucose reference range applies only to samples taken after fasting for at least 8 hours.  Basic metabolic panel     Status: Abnormal   Collection Time: 04/28/21  3:30 AM  Result Value Ref Range   Sodium 137 135 - 145 mmol/L   Potassium 3.8 3.5 - 5.1 mmol/L   Chloride 108 98 - 111 mmol/L   CO2 23 22 - 32 mmol/L   Glucose, Bld 74 70 - 99 mg/dL    Comment: Glucose reference range applies only to samples taken after fasting for at least 8 hours.   BUN <5 (L) 6 - 20 mg/dL   Creatinine, Ser 0.71 0.61 - 1.24 mg/dL   Calcium 8.2 (L) 8.9 - 10.3 mg/dL   GFR, Estimated >60 >60 mL/min    Comment: (NOTE) Calculated using the CKD-EPI Creatinine Equation (2021)    Anion gap 6 5 - 15    Comment: Performed at [place] [address], [place], [place] [address]  CBC     Status: Abnormal   Collection Time: 04/28/21  3:30 AM  Result Value Ref Range   WBC 7.2 4.0 - 10.5 K/uL   RBC 3.19 (L) 4.22 - 5.81 MIL/uL   Hemoglobin 7.7 (L) 13.0 - 17.0 g/dL   HCT 26.0 (L) 39.0 - 52.0 %   MCV 81.5 80.0 - 100.0 fL   MCH 24.1 (L) 26.0 - 34.0 pg   MCHC 29.6 (L) 30.0 - 36.0 g/dL   RDW 18.3 (H) 11.5 - 15.5 %   Platelets 254 150 - 400 K/uL  nRBC 0.0 0.0 - 0.2 %    Comment: Performed at [facility], [place] [address] [place], [place] [address]  Glucose, capillary     Status: Abnormal   Collection Time: 04/28/21  4:18 AM  Result Value Ref Range   Glucose-Capillary 65 (L) 70 - 99 mg/dL    Comment: Glucose reference range applies only to samples taken after fasting for at least 8 hours.  Glucose, capillary     Status: None   Collection Time: 04/28/21  4:35 AM  Result Value Ref Range   Glucose-Capillary 82 70 - 99 mg/dL    Comment: Glucose reference range applies only to samples taken after fasting for at least 8 hours.  Glucose, capillary     Status: None   Collection Time: 04/28/21  7:49 AM  Result Value Ref Range    Glucose-Capillary 85 70 - 99 mg/dL    Comment: Glucose reference range applies only to samples taken after fasting for at least 8 hours.  Cortisol-am, blood     Status: None   Collection Time: 04/28/21  8:03 AM  Result Value Ref Range   Cortisol - AM 14.5 6.7 - 22.6 ug/dL    Comment: Performed at [facility], [place] [address] [place], [place] [address]    Current Facility-Administered Medications  Medication Dose Route Frequency Provider Last Rate Last Admin  . 0.9 %  sodium chloride infusion (Manually program via Guardrails IV Fluids)   Intravenous Once Jennelle Human B, NP      . acetaminophen (TYLENOL) tablet 650 mg  650 mg Oral Q6H PRN Aslam, Sadia, MD      . cefTRIAXone (ROCEPHIN) 2 g in sodium chloride 0.9 % 100 mL IVPB  2 g Intravenous Q24H Audria Nine, DO 200 mL/hr at 04/28/21 1113 2 g at 04/28/21 1113  . chlorhexidine gluconate (MEDLINE KIT) (PERIDEX) 0.12 % solution 15 mL  15 mL Mouth Rinse BID Spero Geralds, MD   15 mL at 04/28/21 0800  . diazepam (VALIUM) tablet 5 mg  5 mg Oral BID Damita Dunnings B, MD   5 mg at 04/28/21 0758  . docusate sodium (COLACE) capsule 100 mg  100 mg Oral BID PRN Spero Geralds, MD      . enoxaparin (LOVENOX) injection 40 mg  40 mg Subcutaneous QHS Audria Nine, DO   40 mg at 04/27/21 2222  . ferrous sulfate tablet 325 mg  325 mg Oral Q breakfast Kc, Ramesh, MD   325 mg at 04/28/21 0758  . folic acid (FOLVITE) tablet 1 mg  1 mg Oral Daily Pham, Minh Q, RPH-CPP   1 mg at 04/28/21 0953  . HYDROcodone-acetaminophen ([place]/VICODIN) 5-325 MG per tablet 1 tablet  1 tablet Oral Q6H PRN [name], MD   1 tablet at 04/28/21 0758  . MEDLINE mouth rinse  15 mL Mouth Rinse 10 times per day Spero Geralds, MD   15 mL at 04/28/21 1102  . methadone (DOLOPHINE) tablet 50 mg  50 mg Oral Daily Kc, Ramesh, MD   50 mg at 04/28/21 0954  . multivitamin with minerals tablet 1 tablet  1 tablet Oral Daily Audria Nine, DO   1 tablet at 04/28/21 0953  .  perampanel (FYCOMPA) tablet 4 mg  4 mg Oral QHS Audria Nine, DO   4 mg at 04/27/21 2222  . polyethylene glycol (MIRALAX / GLYCOLAX) packet 17 g  17 g Oral Daily PRN Spero Geralds, MD      .  pregabalin (LYRICA) capsule 200 mg  200 mg Oral TID Audria Nine, DO   200 mg at 04/28/21 0954  . promethazine (PHENERGAN) tablet 12.5 mg  12.5 mg Oral Q6H PRN Harvie Heck, MD   12.5 mg at 04/28/21 0759  . thiamine tablet 100 mg  100 mg Oral Daily Pham, Minh Q, RPH-CPP   100 mg at 04/28/21 0953  . valproic acid (DEPAKENE) 250 MG/5ML solution 250 mg  250 mg Oral QAC breakfast Damita Dunnings B, MD   250 mg at 04/28/21 1021  . valproic acid (DEPAKENE) 250 MG/5ML solution 750 mg  750 mg Oral Q2000 Damita Dunnings B, MD   750 mg at 04/27/21 2100   Psychiatric Specialty Exam:  Presentation  General Appearance: Bizarre (unphased that his genitals are exposed)  Eye Contact:Good  Speech:Clear and Coherent; Slow  Speech Volume:Normal  Handedness:No data recorded  Mood and Affect  Mood:Euthymic  Affect:Constricted   Thought Process  Thought Processes:Coherent  Descriptions of Associations:Intact  Orientation:Full (Time, Place and Person)  Thought Content:Logical  History of Schizophrenia/Schizoaffective disorder:No data recorded Duration of Psychotic Symptoms:No data recorded Hallucinations:Hallucinations: None  Ideas of Reference:None  Suicidal Thoughts:Suicidal Thoughts: No  Homicidal Thoughts:Homicidal Thoughts: No   Sensorium  Memory:Immediate Good; Recent Good; Remote Good  Judgment:Fair  Insight:Fair   Executive Functions  Concentration:Good  Attention Span:Good  Recall:No data recorded Fund of Knowledge:Good  Language:Good   Psychomotor Activity  Psychomotor Activity:Psychomotor Activity: Decreased   Assets  Assets:Communication Skills; Desire for Improvement; Resilience; Social Support; Housing   Sleep  Sleep:Sleep: Fair   Physical  Exam: Physical Exam HENT:     Head: Normocephalic and atraumatic.  Eyes:     Extraocular Movements: Extraocular movements intact.  Pulmonary:     Effort: Pulmonary effort is normal.  Musculoskeletal:     Comments: Bilateral pedal edema. LUE is edematous (patient reported that his legs have swollen in the past but never his arm)  Skin:    General: Skin is dry.  Neurological:     Mental Status: He is alert.   ROS Blood pressure 110/76, pulse 81, temperature 97.6 F (36.4 C), temperature source Oral, resp. rate 16, height _0  (1.778 m), weight 75.7 kg, SpO2 100 %. Body mass index is 23.95 kg/m.  PGY-2 Freida Busman, MD 04/28/2021 12:28 PM

## 2021-04-28 NOTE — Progress Notes (Signed)
Nutrition Follow-up  DOCUMENTATION CODES:   Non-severe (moderate) malnutrition in context of chronic illness  INTERVENTION:  -Downgrade to dysphagia 3 diet -Ensure Enlive po TID, each supplement provides 350 kcal and 20 grams of protein -MVI with minerals daily  NUTRITION DIAGNOSIS:   Moderate Malnutrition related to chronic illness (COPD, polysubstance abuse, PTSD?) as evidenced by mild fat depletion, mild muscle depletion, moderate fat depletion, moderate muscle depletion, percent weight loss.  updated  GOAL:   Patient will meet greater than or equal to 90% of their needs  progressing  MONITOR:   PO intake, Supplement acceptance, Labs, Weight trends, I & O's  REASON FOR ASSESSMENT:   Ventilator, Consult Enteral/tube feeding initiation and management  ASSESSMENT:   25 yo male admitted to [facility] with LE swelling/redness; he had a seizure, then developed hypoxia and required intubation. Transferred to [facility] 11/6. PMH includes PTSD, depression/anxiety, seizure disorder, microcytic anemia, polysubstance abuse, ADHD, COPD, scoliosis, tachycardia.  11/08 extubated 11/09 tx to [facility]  Pt slow to respond, but is A&Ox3. Psych following and suspect possible bipolar disorder. Unsure if pt is an adequate historian, however, no family at bedside to contribute to history. Pt reports poor appetite and difficulty with chewing and swallowing. Please note SLP is following pt and pt has been ordered a regular diet with thin liquids. Noted pt is edentulous, but pt did not provide sensible answer when asked about this or when asked about his eating history as a whole. RD observed pt attempting to eat potatoes and roast beef. Will downgrade diet due to pt's lack of teeth. Will also provide oral nutrition supplements in hopes of increasing calorie/protein intake.    Pt with severe 20% wt loss x2 months as pt weighed 95 kg on 03/01/21 and now weighs 75.7 kg.   No PO intake documented.   UOP: 1.5L x24  hours I/O: +2.8L since admit  Medications: ferrous sulfate, folvite, mvi with minerals, thiamine, IV abx Labs reviewed.  NUTRITION - FOCUSED PHYSICAL EXAM:  Flowsheet Row Most Recent Value  Orbital Region Moderate depletion  Upper Arm Region Mild depletion  Thoracic and Lumbar Region Mild depletion  Buccal Region Moderate depletion  Temple Region Moderate depletion  Clavicle Bone Region Moderate depletion  Clavicle and Acromion Bone Region Moderate depletion  Scapular Bone Region Moderate depletion  Dorsal Hand Mild depletion  Patellar Region Moderate depletion  Anterior Thigh Region Mild depletion  Posterior Calf Region Mild depletion  Edema (RD Assessment) Mild  Hair Reviewed  Eyes Reviewed  Mouth Other (Comment)  [edentulous]  Skin Reviewed  Nails Reviewed       Diet Order:   Diet Order             DIET DYS 3 Room service appropriate? Yes; Fluid consistency: Thin  Diet effective now                   EDUCATION NEEDS:   No education needs have been identified at this time  Skin:  Skin Assessment: Reviewed RN Assessment  Last BM:  11/6  Height:   Ht Readings from Last 1 Encounters:  04/24/21 5\' 10"  (1.778 m)    Weight:   Wt Readings from Last 1 Encounters:  04/28/21 75.7 kg    BMI:  Body mass index is 23.95 kg/m.  Estimated Nutritional Needs:   Kcal:  2200-2400  Protein:  110-120 grams  Fluid:  >/= 2 L     Sims Laday A., MS, RD, LDN (she/her/hers) RD pager number and  weekend/on-call pager number located in [place].

## 2021-04-28 NOTE — Progress Notes (Signed)
Hypoglycemic Event  CBG: 64  Treatment: 8 oz juice/soda  Symptoms: None  Follow-up CBG: Time:0606 pm  CBG Result:103  Possible Reasons for Event: Vomiting and Inadequate meal intake  Comments/MD notified:asymptomatic, feels okay, IVF order from MD.     Vincent Green

## 2021-04-28 NOTE — Progress Notes (Signed)
Physical Therapy Treatment Patient Details Name: Vincent Green MRN: [id number] DOB: [birth year] Today's Date: 04/28/2021   History of Present Illness Vincent Green, 25 y.o. male with PMH of PTSD, depression/anxiety, seizure disorder, microcytic anemia, and polysubstance use admitted for acute metabolic toxic encephalopathy with breakthrough seizure in setting of polysubstance use at [facility] where he was intubated for acute hypoxic respiratory failure initially with concerns of aspiration pneumonitis, transferred to [facility], ICU.  Extubated 04/26/21.    PT Comments    Patient progressing to hallway ambulation this session, though with poor safety awareness, mod to max cues for focus on task, and continued high risk for falls.  Patient gives verbal history of bedbound state at home prior to this episode with LE edema.  Feel deconditioning and L LE weakness impacting safety and independence.  Continue to recommend acute inpatient rehab prior to d/c home.    Recommendations for follow up therapy are one component of a multi-disciplinary discharge planning process, led by the attending physician.  Recommendations may be updated based on patient status, additional functional criteria and insurance authorization.  Follow Up Recommendations  Acute inpatient rehab (3hours/day)     Assistance Recommended at Discharge Intermittent Supervision/Assistance  Equipment Recommendations  BSC/3in1;Wheelchair (measurements PT)    Recommendations for Other Services       Precautions / Restrictions Precautions Precautions: Fall Precaution Comments: L LE weakness, seizure     Mobility  Bed Mobility               General bed mobility comments: up in chair    Transfers Overall transfer level: Needs assistance Equipment used: Rolling walker (2 wheels) Transfers: Sit to/from Stand Sit to Stand: Min guard           General transfer comment: impulsively rising prior to set up, minguard for  safety and cues for hand placement    Ambulation/Gait Ambulation/Gait assistance: Mod assist Gait Distance (Feet): 50 Feet (&15') Assistive device: Rolling walker (2 wheels) Gait Pattern/deviations: Step-to pattern;Step-through pattern;Knees buckling;Knee flexed in stance - left;Wide base of support       General Gait Details: mod to max cues throughout for walker management, stride length, step to pattern, forward gaze and focusing on task; L LE buckling throughout, but at times controlled when focused and taking shorter step, but at times stepping past the front of the walker as if he forgets to move the walker, at at times taking long step on R though I told him to take a short step and more help needed due to L knee buckling   Stairs             Wheelchair Mobility    Modified Rankin (Stroke Patients Only)       Balance Overall balance assessment: Needs assistance Sitting-balance support: Feet supported Sitting balance-Leahy Scale: Fair     Standing balance support: Single extremity supported;Bilateral upper extremity supported;No upper extremity supported Standing balance-Leahy Scale: Poor Standing balance comment: standing talking with rehab coordinator and at times letting go of walker or crossing his left leg over his right min A throughout for balance and cues for focus on balance                            Cognition Arousal/Alertness: Awake/alert Behavior During Therapy: [name] for tasks assessed/performed;Impulsive;Anxious Overall Cognitive Status: No family/caregiver present to determine baseline cognitive functioning  General Comments: hyperverbal, overly self diagnostic, multimodal cues for safety with mobility and for focusing on task        Exercises      General Comments General comments (skin integrity, edema, etc.): LE edema better per pt, soiled with urine when I initially checked on him and  NT in to help bathe, then chair pad soiled with urine when pt participating in PT session      Pertinent Vitals/Pain Pain Assessment: Faces Faces Pain Scale: Hurts a little bit Pain Location: generalized Pain Descriptors / Indicators: Discomfort;Grimacing Pain Intervention(s): Monitored during session    Home Living                          Prior Function            PT Goals (current goals can now be found in the care plan section) Progress towards PT goals: Progressing toward goals    Frequency    Min 3X/week      PT Plan Current plan remains appropriate    Co-evaluation              AM-PAC PT "6 Clicks" Mobility   Outcome Measure  Help needed turning from your back to your side while in a flat bed without using bedrails?: A Little Help needed moving from lying on your back to sitting on the side of a flat bed without using bedrails?: A Little Help needed moving to and from a bed to a chair (including a wheelchair)?: A Lot Help needed standing up from a chair using your arms (e.g., wheelchair or bedside chair)?: A Little Help needed to walk in hospital room?: A Lot Help needed climbing 3-5 steps with a railing? : Total 6 Click Score: 14    End of Session Equipment Utilized During Treatment: Gait belt Activity Tolerance: Patient tolerated treatment well Patient left: in chair;with call bell/phone within reach;with chair alarm set   PT Visit Diagnosis: Other abnormalities of gait and mobility (R26.89);Muscle weakness (generalized) (M62.81)     Time: 0867-6195 PT Time Calculation (min) (ACUTE ONLY): 26 min  Charges:  $Gait Training: 23-37 mins                     Sheran Lawless, PT Acute Rehabilitation Services Pager:[phone] Office:[phone] 04/28/2021    Vincent Green 04/28/2021, 1:55 PM

## 2021-04-28 NOTE — Progress Notes (Signed)
Hypoglycemic Event  CBG: 63  Treatment: 4 oz juice/soda  Symptoms: None  Follow-up CBG: Time:2141 CBG Result:79 146 at 2330  Possible Reasons for Event: Vomiting and Inadequate meal intake  Comments/MD notified: was caught sticking his fingers down his throat causing himself to vomit.     Thom Chimes

## 2021-04-28 NOTE — Progress Notes (Signed)
PROGRESS NOTE    Vincent Green  TWK:[id number] DOB: 25-Nov-1995 DOA: 04/24/2021 PCP: Leonie Douglas, MD   Chief Complaint  Patient presents with   Edema  Brief Narrative/Hospital Course:  Vincent Green, 25 y.o. male with PMH of PTSD, depression/anxiety, seizure disorder, microcytic anemia, and polysubstance use admitted for acute metabolic toxic encephalopathy with breakthrough seizure in setting of polysubstance use at [facility] ED where he was intubated for acute hypoxic respiratory failure initially with concerns of aspiration pneumonitis, transferred to [facility], ICU. Has been covered with vancomycin-switched to rocephin for this and also for bilateral lower extremity cellulitis he was extubated 11/8 to follow nasal cannula seen by neurology in the ED adjusted. Transferred to [facility] 11/9.  Patient is slow to respond but alert awake oriented x3.  Psychiatry service consulted suspecting possible bipolar, psych meds has been adjusted.  Patient is also on chronic methadone verified with his father and with methadone clinic by nursing staff 11/9 and placed back with plan to titrate to home dose of 90 mg   Subjective: Reports he has chronic back pain in mid back and upper neck  Overnight blood sugar was as low as 65. Afebrile. He is more alert and communicative today Denies any substance abuse other than the prescribed medication, and marijuana  Assessment & Plan:  Acute hypoxemic respiratory failure Acute toxic encephalopathy in the setting of polypharmacy Seizure disorder: Patient was intubated and subsequently extubated 11/8.  Now on room air.  Mental status oriented x3 but somewhat slow to respond.  Psych meds adjusted by psychiatry.  Continue with current AEDs as per neurology.  Continue PT OT and planning for CIR  Aspiration pneumonitis: Continue ceftriaxone.Not needing oxygen.Can transition to oral Augmentin  Bilateral lower extremity cellulitis POA B/l Leg edema-appears  chronic: Continued ceftriaxone can transition to oral antibiotic.leg edema appears somewhat chronic in nature.TTE on 04/25/21:lvef 55-60%,No RWMA,his duplex on leg were negative in sept 2022.Checked duplex again and is neg for dvt. WILL DOSE LASIX X1.  Chronic back pain on methadone and lyrica Chronic pain:  Cont methadone at 50 mg and lyrica. Concern abt somnolence with all these meds. He claims he got seizure after getting narcan in ED at [facility]. Reprots he sees neurology for his chronic pain and had MRI abt 3 months ago.  Possible Polysubstance abuse hx- uds + for benzo, amphetamine and thc. Denies substrance abuse to me. he reports he does do marijuawana and other meds are prescribed. Minimize polypharmacy.  PTSD Anxiety Depression-concern for BPD: Appreciate psychiatry input start Valium 5 mg twice daily, illicit Depakote 711 daily on 750 nightly for seizure and concern for possible bipolar disorder.  Social worker referral for outpatient psychology and outpatient psych. Thyroid function is stable.  Acute on chronic microcytic anemia:hemoglobin 6 on admission baseline 9 g, received 2 units transfusion, hemoglobin overall stable on monitor.FOBT was negative.Monitor H&H transfuse for less than 7 g.  Continue iron supplementation Recent Labs  Lab 04/25/21 0429 04/25/21 0442 04/25/21 1953 04/26/21 0639 04/28/21 0330  HGB 7.1* 6.5* 7.7* 8.1* 7.7*  HCT 23.7* 19.0* 25.9* 27.5* 26.0*    Hypoglycemia needing D10 while in ICU. Off D10, again noted hypoglycemia 65, check a.m. cortisol level. Liberalize oral intake Recent Labs  Lab 04/27/21 2049 04/28/21 0054 04/28/21 0418 04/28/21 0435 04/28/21 0749  GLUCAP 82 74 65* 82 85    Painful penile lesion:GC/Chlamydia pending. Follow-up  Deconditioning/debility/generalized weakness continue PT OT-recommending inpatient rehab.   DVT prophylaxis: enoxaparin (LOVENOX) injection 40 mg Start: 04/26/21 2200  SCDs Start: 04/24/21 1831 Code Status:    Code Status: Full Code Family Communication: plan of care discussed with patient at bedside. Status is: Inpatient Remains inpatient appropriate because: Ongoing management of her generalized weakness acute encephalopathy , hypoglycemia and will need inpatient rehab   Objective: Vitals last 24 hrs: Vitals:   04/28/21 0046 04/28/21 0415 04/28/21 0500 04/28/21 0745  BP: (!) 126/97 120/88  110/76  Pulse: 64 67  81  Resp: 18 16    Temp: 98.9 F (37.2 C) 97.6 F (36.4 C)    TempSrc: Oral Oral    SpO2: 98% 96%  100%  Weight:   75.7 kg   Height:       Weight change: 0.3 kg  Intake/Output Summary (Last 24 hours) at 04/28/2021 0630 Last data filed at 04/28/2021 0000 Gross per 24 hour  Intake 563.18 ml  Output 1500 ml  Net -936.82 ml   Net IO Since Admission: 3,723.99 mL [04/28/21 0832]   Physical Examination: General exam: AAOx 3,  older than stated age,  HEENT:Oral mucosa moist, Ear/Nose WNL grossly, dentition normal. Respiratory system: Bilaterally clear,no use of accessory muscle Cardiovascular system: S1 & S2 +, No JVD,. Gastrointestinal system: Abdomen soft,NT,ND, BS+ Nervous System:Alert, awake, moving all his extremities. Extremities: LE edema +, distal peripheral pulses palpable.  Skin:No rashes,no icterus. ZSW:FUXNAT muscle bulk,tone, power   Medications reviewed:  Scheduled Meds:  sodium chloride   Intravenous Once   chlorhexidine gluconate (MEDLINE KIT)  15 mL Mouth Rinse BID   diazepam  5 mg Oral BID   enoxaparin (LOVENOX) injection  40 mg Subcutaneous QHS   ferrous sulfate  325 mg Oral Q breakfast   folic acid  1 mg Oral Daily   mouth rinse  15 mL Mouth Rinse 10 times per day   methadone  50 mg Oral Daily   multivitamin with minerals  1 tablet Oral Daily   perampanel  4 mg Oral QHS   pregabalin  200 mg Oral TID   thiamine  100 mg Oral Daily   valproic acid  250 mg Oral QAC breakfast   valproic acid  750 mg Oral Q2000   Continuous Infusions:  cefTRIAXone  (ROCEPHIN)  IV Stopped (04/27/21 1748)    Diet Order             Diet regular Room service appropriate? Yes; Fluid consistency: Thin  Diet effective now                   Nutrition Problem: Inadequate oral intake Etiology: inability to eat Signs/Symptoms: NPO status Interventions: MVI, Tube feeding  Weight change: 0.3 kg  Wt Readings from Last 3 Encounters:  04/28/21 75.7 kg  03/01/21 95 kg  02/05/21 95.3 kg     Consultants:see note  Procedures:see note Antimicrobials: Anti-infectives (From admission, onward)    Start     Dose/Rate Route Frequency Ordered Stop   04/27/21 1000  cefTRIAXone (ROCEPHIN) 2 g in sodium chloride 0.9 % 100 mL IVPB        2 g 200 mL/hr over 30 Minutes Intravenous Every 24 hours 04/26/21 1244 04/30/21 0959   04/25/21 1530  cefTRIAXone (ROCEPHIN) 1 g in sodium chloride 0.9 % 100 mL IVPB  Status:  Discontinued        1 g 200 mL/hr over 30 Minutes Intravenous Every 24 hours 04/25/21 1436 04/26/21 1244   04/25/21 0100  vancomycin (VANCOREADY) IVPB 1500 mg/300 mL  Status:  Discontinued  1,500 mg 150 mL/hr over 120 Minutes Intravenous Every 12 hours 04/24/21 1509 04/25/21 1436   04/24/21 2300  cefTRIAXone (ROCEPHIN) 1 g in sodium chloride 0.9 % 100 mL IVPB  Status:  Discontinued        1 g 200 mL/hr over 30 Minutes Intravenous Every 24 hours 04/24/21 2209 04/24/21 2211   04/24/21 1500  Ampicillin-Sulbactam (UNASYN) 3 g in sodium chloride 0.9 % 100 mL IVPB  Status:  Discontinued        3 g 200 mL/hr over 30 Minutes Intravenous  Once 04/24/21 1445 04/24/21 1452   04/24/21 1500  ceFEPIme (MAXIPIME) 1 g in sodium chloride 0.9 % 100 mL IVPB  Status:  Discontinued        1 g 200 mL/hr over 30 Minutes Intravenous  Once 04/24/21 1453 04/24/21 1918   04/24/21 1500  azithromycin (ZITHROMAX) 500 mg in sodium chloride 0.9 % 250 mL IVPB        500 mg 250 mL/hr over 60 Minutes Intravenous  Once 04/24/21 1453 04/24/21 1808   04/24/21 1500  vancomycin  (VANCOREADY) IVPB 1000 mg/200 mL        1,000 mg 200 mL/hr over 60 Minutes Intravenous  Once 04/24/21 1456 04/24/21 2109      Culture/Microbiology    Component Value Date/Time   SDES TRACHEAL ASPIRATE 04/24/2021 1919   SPECREQUEST NONE 04/24/2021 1919   CULT  04/24/2021 1919    RARE Normal respiratory flora-no Staph aureus or Pseudomonas seen Performed at [facility], [place] [address], [place], [place] [address]    REPTSTATUS 04/27/2021 FINAL 04/24/2021 1919    Other culture-see note  Unresulted Labs (From admission, onward)     Start     Ordered   04/28/21 0800  Cortisol-am, blood  Add-on,   AD       Question:  Specimen collection method  Answer:  Lab=Lab collect   04/28/21 0759   04/27/21 0951  T3, free  Add-on,   AD       Question:  Specimen collection method  Answer:  Lab=Lab collect   04/27/21 0951   04/26/21 0500  Methylmalonic acid, serum  Once,   R       Question:  Specimen collection method  Answer:  Lab=Lab collect   04/25/21 2222   04/26/21 0500  Vitamin B1  Once,   R       Question:  Specimen collection method  Answer:  Lab=Lab collect   04/25/21 2222   04/25/21 2000  Drug Screen 10 W/Conf, Serum  Once,   R        04/25/21 2000          Data Reviewed: I have personally reviewed following labs and imaging studies CBC: Recent Labs  Lab 04/24/21 1156 04/24/21 2015 04/24/21 2257 04/25/21 0429 04/25/21 0442 04/25/21 1953 04/26/21 0639 04/28/21 0330  WBC 6.3  --  13.0* 9.7  --   --  7.7 7.2  NEUTROABS 5.0  --   --   --   --   --   --   --   HGB 6.0*   < > 8.1* 7.1* 6.5* 7.7* 8.1* 7.7*  HCT 22.2*   < > 27.3* 23.7* 19.0* 25.9* 27.5* 26.0*  MCV 82.8  --  81.0 80.6  --   --  81.6 81.5  PLT 273  --  263 253  --   --  256 254   < > = values in this interval  not displayed.   Basic Metabolic Panel: Recent Labs  Lab 04/24/21 1156 04/24/21 1949 04/24/21 2015 04/25/21 0429 04/25/21 0442 04/26/21 0639 04/28/21 0330  NA 137  --  139 137 139 135 137   K 3.6  --  3.1* 3.4* 3.3* 4.2 3.8  CL 104  --   --  110  --  107 108  CO2 26  --   --  22  --  22 23  GLUCOSE 104*  --   --  99  --  95 74  BUN 12  --   --  8  --  6 <5*  CREATININE 0.91  --   --  0.79  --  0.79 0.71  CALCIUM 8.6*  --   --  7.5*  --  8.0* 8.2*  MG  --  1.6*  --  1.9  --  1.8  --   PHOS  --  2.3*  --  3.0  --  2.9  --    GFR: Estimated Creatinine Clearance: 144.5 mL/min (by C-G formula based on SCr of 0.71 mg/dL). Liver Function Tests: Recent Labs  Lab 04/24/21 1156 04/26/21 0639  AST 44* 20  ALT 21 19  ALKPHOS 218* 130*  BILITOT 0.2* 0.5  PROT 5.8* 4.2*  ALBUMIN 2.5* 1.7*   Recent Labs  Lab 04/25/21 1419  LIPASE 23   No results for input(s): AMMONIA in the last 168 hours. Coagulation Profile: No results for input(s): INR, PROTIME in the last 168 hours. Cardiac Enzymes: No results for input(s): CKTOTAL, CKMB, CKMBINDEX, TROPONINI in the last 168 hours. BNP (last 3 results) No results for input(s): PROBNP in the last 8760 hours. HbA1C: No results for input(s): HGBA1C in the last 72 hours. CBG: Recent Labs  Lab 04/27/21 2049 04/28/21 0054 04/28/21 0418 04/28/21 0435 04/28/21 0749  GLUCAP 82 74 65* 82 85   Lipid Profile: No results for input(s): CHOL, HDL, LDLCALC, TRIG, CHOLHDL, LDLDIRECT in the last 72 hours. Thyroid Function Tests: Recent Labs    04/27/21 0639  TSH 2.905  FREET4 1.07   Anemia Panel: Recent Labs    04/26/21 0639  VITAMINB12 353  FOLATE 23.0   Sepsis Labs: No results for input(s): PROCALCITON, LATICACIDVEN in the last 168 hours.  Recent Results (from the past 240 hour(s))  Culture, blood (single)     Status: None (Preliminary result)   Collection Time: 04/24/21  3:26 PM   Specimen: BLOOD RIGHT HAND  Result Value Ref Range Status   Specimen Description BLOOD RIGHT HAND  Final   Special Requests   Final    BOTTLES DRAWN AEROBIC ONLY Blood Culture adequate volume   Culture   Final    NO GROWTH 4 DAYS Performed  at [facility], [address] [place], [place] [address]    Report Status PENDING  Incomplete  Blood culture (routine x 2)     Status: None (Preliminary result)   Collection Time: 04/24/21  3:26 PM   Specimen: BLOOD  Result Value Ref Range Status   Specimen Description BLOOD  Final   Special Requests NONE  Final   Culture   Final    NO GROWTH 4 DAYS Performed at [facility], [address] [place], [place] [address]    Report Status PENDING  Incomplete  Blood culture (routine x 2)     Status: None (Preliminary result)   Collection Time: 04/24/21  3:26 PM   Specimen: BLOOD  Result Value Ref Range Status  Specimen Description BLOOD  Final   Special Requests NONE  Final   Culture   Final    NO GROWTH 4 DAYS Performed at [facility], [address], [place], [place] [address]    Report Status PENDING  Incomplete  MRSA Next Gen by PCR, Nasal     Status: None   Collection Time: 04/24/21  7:18 PM   Specimen: Nasal Mucosa; Nasal Swab  Result Value Ref Range Status   MRSA by PCR Next Gen NOT DETECTED NOT DETECTED Final    Comment: (NOTE) The GeneXpert MRSA Assay (FDA approved for NASAL specimens only), is one component of a comprehensive MRSA colonization surveillance program. It is not intended to diagnose MRSA infection nor to guide or monitor treatment for MRSA infections. Test performance is not FDA approved in patients less than 66 years old. Performed at [facility], [place] [address] [place], [place] [address]   Culture, Respiratory w Gram Stain     Status: None   Collection Time: 04/24/21  7:19 PM   Specimen: Tracheal Aspirate; Respiratory  Result Value Ref Range Status   Specimen Description TRACHEAL ASPIRATE  Final   Special Requests NONE  Final   Gram Stain   Final    RARE SQUAMOUS EPITHELIAL CELLS PRESENT ABUNDANT WBC PRESENT, PREDOMINANTLY PMN RARE GRAM POSITIVE COCCI    Culture   Final    RARE Normal respiratory flora-no Staph aureus or Pseudomonas  seen Performed at [facility], [address] [place], [place] [address]    Report Status 04/27/2021 FINAL  Final  Resp Panel by RT-PCR (Flu A&B, Covid) Nasopharyngeal Swab     Status: None   Collection Time: 04/27/21  1:17 AM   Specimen: Nasopharyngeal Swab; Nasopharyngeal(NP) swabs in vial transport medium  Result Value Ref Range Status   SARS Coronavirus 2 by RT PCR NEGATIVE NEGATIVE Final    Comment: (NOTE) SARS-CoV-2 target nucleic acids are NOT DETECTED.  The SARS-CoV-2 RNA is generally detectable in upper respiratory specimens during the acute phase of infection. The lowest concentration of SARS-CoV-2 viral copies this assay can detect is 138 copies/mL. A negative result does not preclude SARS-Cov-2 infection and should not be used as the sole basis for treatment or other patient management decisions. A negative result may occur with  improper specimen collection/handling, submission of specimen other than nasopharyngeal swab, presence of viral mutation(s) within the areas targeted by this assay, and inadequate number of viral copies(<138 copies/mL). A negative result must be combined with clinical observations, patient history, and epidemiological information. The expected result is Negative.  Fact Sheet for Patients:  EntrepreneurPulse.com.au  Fact Sheet for Healthcare Providers:  IncredibleEmployment.be  This test is no t yet approved or cleared by the [place] FDA and  has been authorized for detection and/or diagnosis of SARS-CoV-2 by FDA under an Emergency Use Authorization (EUA). This EUA will remain  in effect (meaning this test can be used) for the duration of the COVID-19 declaration under Section 564(b)(1) of the Act, 21 U.S.C.section 360bbb-3(b)(1), unless the authorization is terminated  or revoked sooner.       Influenza A by PCR NEGATIVE NEGATIVE Final   Influenza B by PCR NEGATIVE NEGATIVE Final     Comment: (NOTE) The Xpert Xpress SARS-CoV-2/FLU/RSV plus assay is intended as an aid in the diagnosis of influenza from Nasopharyngeal swab specimens and should not be used as a sole basis for treatment. Nasal washings and aspirates are unacceptable for Xpert Xpress  SARS-CoV-2/FLU/RSV testing.  Fact Sheet for Patients: EntrepreneurPulse.com.au  Fact Sheet for Healthcare Providers: IncredibleEmployment.be  This test is not yet approved or cleared by the [place] FDA and has been authorized for detection and/or diagnosis of SARS-CoV-2 by FDA under an Emergency Use Authorization (EUA). This EUA will remain in effect (meaning this test can be used) for the duration of the COVID-19 declaration under Section 564(b)(1) of the Act, 21 U.S.C. section 360bbb-3(b)(1), unless the authorization is terminated or revoked.  Performed at [facility], [place] [address] [place], [place] [address]      Radiology Studies: VAS [place] LOWER EXTREMITY VENOUS (DVT)  Result Date: 04/27/2021  Lower Venous DVT Study Patient Name:  Vincent Green  Date of Exam:   04/27/2021 Medical Rec #: [id number]        Accession #:    [id number] Date of Birth: [birth year]        Patient Gender: M Patient Age:   46 years Exam Location:  [facility] Procedure:      VAS [place] LOWER EXTREMITY VENOUS (DVT) Referring Phys: Dierks Chiron Campione --------------------------------------------------------------------------------  Indications: Swelling, and Edema.  Comparison Study: 03/01/21 prior Performing Technologist: Archie Patten RVS  Examination Guidelines: A complete evaluation includes B-mode imaging, spectral Doppler, color Doppler, and power Doppler as needed of all accessible portions of each vessel. Bilateral testing is considered an integral part of a complete examination. Limited examinations for reoccurring indications may be performed as noted. The reflux portion of the exam is performed with  the patient in reverse Trendelenburg.  +---------+---------------+---------+-----------+----------+--------------+ RIGHT    CompressibilityPhasicitySpontaneityPropertiesThrombus Aging +---------+---------------+---------+-----------+----------+--------------+ CFV      Full           Yes      Yes                                 +---------+---------------+---------+-----------+----------+--------------+ SFJ      Full                                                        +---------+---------------+---------+-----------+----------+--------------+ FV Prox  Full                                                        +---------+---------------+---------+-----------+----------+--------------+ FV Mid   Full                                                        +---------+---------------+---------+-----------+----------+--------------+ FV DistalFull                                                        +---------+---------------+---------+-----------+----------+--------------+ PFV      Full                                                        +---------+---------------+---------+-----------+----------+--------------+  POP      Full           Yes      Yes                                 +---------+---------------+---------+-----------+----------+--------------+ PTV      Full                                                        +---------+---------------+---------+-----------+----------+--------------+ PERO     Full                                                        +---------+---------------+---------+-----------+----------+--------------+   +---------+---------------+---------+-----------+----------+--------------+ LEFT     CompressibilityPhasicitySpontaneityPropertiesThrombus Aging +---------+---------------+---------+-----------+----------+--------------+ CFV      Full           Yes      Yes                                  +---------+---------------+---------+-----------+----------+--------------+ SFJ      Full                                                        +---------+---------------+---------+-----------+----------+--------------+ FV Prox  Full                                                        +---------+---------------+---------+-----------+----------+--------------+ FV Mid   Full                                                        +---------+---------------+---------+-----------+----------+--------------+ FV DistalFull                                                        +---------+---------------+---------+-----------+----------+--------------+ PFV      Full                                                        +---------+---------------+---------+-----------+----------+--------------+ POP      Full           Yes      Yes                                 +---------+---------------+---------+-----------+----------+--------------+  PTV      Full                                                        +---------+---------------+---------+-----------+----------+--------------+ PERO     Full                                                        +---------+---------------+---------+-----------+----------+--------------+     Summary: BILATERAL: - No evidence of deep vein thrombosis seen in the lower extremities, bilaterally. -No evidence of popliteal cyst, bilaterally.   *See table(s) above for measurements and observations. Electronically signed by Jamelle Haring on 04/27/2021 at 12:05:03 PM.    Final      LOS: 4 days   [name], MD [facility]  04/28/2021, 8:32 AM

## 2021-04-29 DIAGNOSIS — E440 Moderate protein-calorie malnutrition: Secondary | ICD-10-CM | POA: Insufficient documentation

## 2021-04-29 DIAGNOSIS — D509 Iron deficiency anemia, unspecified: Secondary | ICD-10-CM

## 2021-04-29 DIAGNOSIS — R1319 Other dysphagia: Secondary | ICD-10-CM

## 2021-04-29 LAB — GLUCOSE, CAPILLARY
Glucose-Capillary: 104 mg/dL — ABNORMAL HIGH (ref 70–99)
Glucose-Capillary: 85 mg/dL (ref 70–99)
Glucose-Capillary: 95 mg/dL (ref 70–99)
Glucose-Capillary: 115 mg/dL — ABNORMAL HIGH (ref 70–99)
Glucose-Capillary: 106 mg/dL — ABNORMAL HIGH (ref 70–99)
Glucose-Capillary: 89 mg/dL (ref 70–99)

## 2021-04-29 LAB — CULTURE, BLOOD (SINGLE)
Culture: NO GROWTH
Special Requests: ADEQUATE

## 2021-04-29 LAB — CULTURE, BLOOD (ROUTINE X 2)
Culture: NO GROWTH
Culture: NO GROWTH

## 2021-04-29 LAB — HEMOGLOBIN AND HEMATOCRIT, BLOOD
HCT: 29.9 % — ABNORMAL LOW (ref 39.0–52.0)
Hemoglobin: 8.6 g/dL — ABNORMAL LOW (ref 13.0–17.0)

## 2021-04-29 LAB — TRANSFERRIN: Transferrin: 196 mg/dL (ref 180–329)

## 2021-04-29 LAB — IRON: Iron: 23 ug/dL — ABNORMAL LOW (ref 45–182)

## 2021-04-29 LAB — METHYLMALONIC ACID, SERUM: Methylmalonic Acid, Quantitative: 380 nmol/L — ABNORMAL HIGH (ref 0–378)

## 2021-04-29 MED ORDER — DEXTROSE-NACL 5-0.9 % IV SOLN: INTRAVENOUS | Status: DC

## 2021-04-29 MED ORDER — ENOXAPARIN SODIUM 40 MG/0.4ML IJ SOSY
40.0000 mg | PREFILLED_SYRINGE | Freq: Every day | INTRAMUSCULAR | Status: DC
  Administered 2021-05-01 – 2021-05-03 (×3): 40 mg via SUBCUTANEOUS
  Filled 2021-04-29 (×3): qty 0.4

## 2021-04-29 MED ORDER — IBUPROFEN 200 MG PO TABS: 200.0000 mg | ORAL_TABLET | Freq: Four times a day (QID) | ORAL | Status: DC | PRN

## 2021-04-29 MED ORDER — HYDROCODONE-ACETAMINOPHEN 5-325 MG PO TABS
1.0000 | ORAL_TABLET | Freq: Four times a day (QID) | ORAL | Status: DC | PRN
  Administered 2021-04-30: 1 via ORAL
  Filled 2021-04-29: qty 1

## 2021-04-29 MED ORDER — NICOTINE 14 MG/24HR TD PT24
14.0000 mg | MEDICATED_PATCH | Freq: Every day | TRANSDERMAL | Status: DC
  Administered 2021-04-29 – 2021-05-02 (×4): 14 mg via TRANSDERMAL
  Filled 2021-04-29 (×4): qty 1

## 2021-04-29 MED ORDER — METOCLOPRAMIDE HCL 5 MG/ML IJ SOLN
10.0000 mg | Freq: Once | INTRAMUSCULAR | Status: AC
  Administered 2021-04-30: 10 mg via INTRAVENOUS
  Filled 2021-04-29: qty 2

## 2021-04-29 MED ORDER — PANTOPRAZOLE SODIUM 40 MG IV SOLR
40.0000 mg | INTRAVENOUS | Status: DC
  Administered 2021-04-29: 40 mg via INTRAVENOUS
  Filled 2021-04-29: qty 40

## 2021-04-29 NOTE — Progress Notes (Signed)
CSW attempted to contact [facility] in [place], [address] [place], [place], [place] [address], [phone]. They are the outpt psych provider in [facility], they were closed for Veteran's Day.  Pt will need to call for appt. Daleen Squibb, MSW, LCSW 11/11/20222:11 PM

## 2021-04-29 NOTE — Plan of Care (Signed)

## 2021-04-29 NOTE — Consult Note (Signed)
[name]   Reason for Consult:  Anxiety and depression "on multiple psych meds but seems to not be taking them at home." Referring Physician:  [name], MD Patient Identification: Vincent Green MRN:  [id number] Principal Diagnosis: <principal problem not specified> Diagnosis:  Active Problems:   Microcytic anemia   Respiratory arrest ([place])   Acute hypoxemic respiratory failure ([place])   Malnutrition of moderate degree  Assessment  Vincent Green is a 25 y.o. male admitted medically  04/24/2021 11:31 AM for cellulitis and seizure.Patient carries the psychiatric diagnoses of ADHD, PTSD, insomnia and anxiety  and has a past medical history of   Seizure disorder and scoliosis .Psychiatry was consulted for concern for patient medication regimen.   He meets criteria for PTSD and anxiety based on hx per father and EMR.  Outpatient psychotropic medications include Valium for Anxiety and Ambien for Insomnia  and historically he has had a good response to these medications. He was mostly compliant with medications prior to admission as evidenced by dad endorsing not being very concerned, but also endorsing that dad still managed patient's adherence to his Depakote for seizures. On initial examination, patient appeared oversedated and very drowsy. We plan to recommend psychotropic interventions to assist with patient's anxiety and reported mood dysregulation noted by dad .    On assessment today patient appear more ill 2/2 emesis. It was reported and document by ON RN that patient was inducing his emesis. This is concerning that patient may be exhibiting symptoms of factitious disorder vs Malingering vs a restrictive eating disorder. Patient does appear to be more linear today in thought process and focused on pain medications and phenergan which is most concerning that he is malingering to get medications that may make him feel "high." Patient reports he is on a lower dose than at  home of his methadone and if he was taking more at home this could be why he appears to be seeking these medications. Patient also focuses on his pain and pain medications; although it is not unlikely that the patient is in pain, as he does have physical ailments that may cause pain, he appears hyperfoucsed on this.  In regards to patient's PTSD symptoms he appears to have responded well to the Prazosin. Will not adjust today due to soft BP's likely 2/2 to his emesis and poor diet. Continued to push to patient that it is important that he eat hospital provided food to increase his protein intake. Patient endorsed that he would do this moving forward; however will continue to monitor if patient is restricting food intake or purging due to his food intake.   Labs Reviewed: TSH- 2.905, T4-1.07, T3- pending, RPR- neg, Lipase- WNL, CBC- Hgb 7.7 CMP- Albumin 1.7, T protein 4.2, ALP 130 UDS + (THC, Benzos[prescribed], Amphetamines  [prescribed])   Plan Seizure disorder PTSD Anxiety Concern for malingering vs Factitious disorder vs restrictive eating disorder - Would recommend Depakote 267m daily and 750QHS, seizures and concern for possible Bipolar disorder. Prefer to have larger dose at night due to sedation side effect - Continue Valium 587mBID, concerned that patient may have been post-ictal this AM, patient has a hx of seizures on downward titration of Benzos - Continue Prazosin 81m72m Recommend discontinuing Phenergan PRN - Consider 1:1 supervision - Recommend consult to SW for referrals to OP Psych   Malnutrition - Recommend dietary consult   Safety  At this time patient appears to be of minimal risk of harm to himself.  This is based off of patient's presentation and collateral from father. However, patient's possibility of inducing illness symptoms is concerning and suggest he need closer observation. -Recommend 1:1   Dispo - Per primary   Guardian - Per father, patient is legally his  own guardian Thank you for this consult. We will continue to follow.   Total Time spent with patient: 15 minutes  Subjective:   Vincent Green is a 25 y.o. male patient admitted with PMHx of PTSD, depression/anxiety, seizure disorder, microcytic anemia, and polysubstance use with ?murmur who initially presented to [facility] ER on 11/6 with worsening lower extremity swelling and pain for two days and transferred to [facility] ICU after became altered with pinpoint pupils for which given Narcan with improvement. However, noted to have witnessed seizure - becoming hypoxic, suspect aspiration requiring intubation for airway protection.  HPI:  ON patient was reported to have episodes of emesis and was later noted to be inducing these episodes himself. On assessment this AM patient endorses emesis, but denies he has been self-inducing episodes. Patient reports that he believes that he slept "better" last night and thinks that the Prazosin was helpful. Patient reports that despite this his mood today is "like crap because I am vomiting and in pain." Patient reports that he is having pain all over his body and continues to have swelling in his extremities. Patient begins to talk about his pain medication regimen in the hospital and also that he does not think he is receiving enough of his methadone and that this is his reasoning behind why he believes he is in a lot of pain.  Ptietn and provider discuss that decreasing patient's swelling will likely help decrease his pain. Patient endorsed that he was not eating the hospital food and had only been eating Ramen and Spinach. Patient and provider discussed that patient needs more protein and that his diet has been made to accommodate this and his food is pre cut to assist with his concerns. Patient endorsed that he would eat hospital food today.  Patient denied SI, HI, and AVH on assessment today.    Past Medical History:  Past Medical History:  Diagnosis Date  .  ADHD   . Anxiety   . Anxiety   . Chronic dental pain   . COPD (chronic obstructive pulmonary disease) ([place])   . Insomnia   . PTSD (post-traumatic stress disorder)   . PTSD (post-traumatic stress disorder)   . Scoliosis   . Seizures ([facility])    benzo-seizure. last seizure 01/2019 per pt (documented 08/12/19)  . Tachycardia     Past Surgical History:  Procedure Laterality Date  . CHOANAL ATRESIA REPAIR    . NOSE SURGERY    . SKIN GRAFT     Family History:  Family History  Problem Relation Age of Onset  . Other Mother 45       04/2019 autopsy pending  . GER disease Paternal Grandmother   . Colon cancer Neg Hx     Social History:  Social History   Substance and Sexual Activity  Alcohol Use No     Social History   Substance and Sexual Activity  Drug Use Not Currently  . Types: Benzodiazepines, Marijuana    Social History   Socioeconomic History  . Marital status: Single    Spouse name: Not on file  . Number of children: Not on file  . Years of education: Not on file  . Highest education level: Not on file  Occupational History  . Not on file  Tobacco Use  . Smoking status: Every Day    Packs/day: 0.50    Types: Cigarettes  . Smokeless tobacco: Never  . Tobacco comments:    smokes 6 cigarettes daily  Vaping Use  . Vaping Use: Former  Substance and Sexual Activity  . Alcohol use: No  . Drug use: Not Currently    Types: Benzodiazepines, Marijuana  . Sexual activity: Not on file  Other Topics Concern  . Not on file  Social History Narrative  . Not on file   Social Determinants of Health   Financial Resource Strain: Not on file  Food Insecurity: Not on file  Transportation Needs: Not on file  Physical Activity: Not on file  Stress: Not on file  Social Connections: Not on file   Additional Social History:    Allergies:   Allergies  Allergen Reactions  . Tessalon [Benzonatate] Swelling    Throat swelling    Labs:  Results for orders placed or  performed during the hospital encounter of 04/24/21 (from the past 48 hour(s))  Glucose, capillary     Status: None   Collection Time: 04/27/21 12:25 PM  Result Value Ref Range   Glucose-Capillary 81 70 - 99 mg/dL    Comment: Glucose reference range applies only to samples taken after fasting for at least 8 hours.  Glucose, capillary     Status: None   Collection Time: 04/27/21  3:55 PM  Result Value Ref Range   Glucose-Capillary 91 70 - 99 mg/dL    Comment: Glucose reference range applies only to samples taken after fasting for at least 8 hours.  Glucose, capillary     Status: None   Collection Time: 04/27/21  8:49 PM  Result Value Ref Range   Glucose-Capillary 82 70 - 99 mg/dL    Comment: Glucose reference range applies only to samples taken after fasting for at least 8 hours.  Glucose, capillary     Status: None   Collection Time: 04/28/21 12:54 AM  Result Value Ref Range   Glucose-Capillary 74 70 - 99 mg/dL    Comment: Glucose reference range applies only to samples taken after fasting for at least 8 hours.  Basic metabolic panel     Status: Abnormal   Collection Time: 04/28/21  3:30 AM  Result Value Ref Range   Sodium 137 135 - 145 mmol/L   Potassium 3.8 3.5 - 5.1 mmol/L   Chloride 108 98 - 111 mmol/L   CO2 23 22 - 32 mmol/L   Glucose, Bld 74 70 - 99 mg/dL    Comment: Glucose reference range applies only to samples taken after fasting for at least 8 hours.   BUN <5 (L) 6 - 20 mg/dL   Creatinine, Ser 0.71 0.61 - 1.24 mg/dL   Calcium 8.2 (L) 8.9 - 10.3 mg/dL   GFR, Estimated >60 >60 mL/min    Comment: (NOTE) Calculated using the CKD-EPI Creatinine Equation (2021)    Anion gap 6 5 - 15    Comment: Performed at [facility] [address], [place], [place] [address]  CBC     Status: Abnormal   Collection Time: 04/28/21  3:30 AM  Result Value Ref Range   WBC 7.2 4.0 - 10.5 K/uL   RBC 3.19 (L) 4.22 - 5.81 MIL/uL   Hemoglobin 7.7 (L) 13.0 - 17.0 g/dL   HCT 26.0 (L)  39.0 - 52.0 %   MCV 81.5 80.0 -  100.0 fL   MCH 24.1 (L) 26.0 - 34.0 pg   MCHC 29.6 (L) 30.0 - 36.0 g/dL   RDW 18.3 (H) 11.5 - 15.5 %   Platelets 254 150 - 400 K/uL   nRBC 0.0 0.0 - 0.2 %    Comment: Performed at [facility] [address] [place], [place] [address]  Glucose, capillary     Status: Abnormal   Collection Time: 04/28/21  4:18 AM  Result Value Ref Range   Glucose-Capillary 65 (L) 70 - 99 mg/dL    Comment: Glucose reference range applies only to samples taken after fasting for at least 8 hours.  Glucose, capillary     Status: None   Collection Time: 04/28/21  4:35 AM  Result Value Ref Range   Glucose-Capillary 82 70 - 99 mg/dL    Comment: Glucose reference range applies only to samples taken after fasting for at least 8 hours.  Glucose, capillary     Status: None   Collection Time: 04/28/21  7:49 AM  Result Value Ref Range   Glucose-Capillary 85 70 - 99 mg/dL    Comment: Glucose reference range applies only to samples taken after fasting for at least 8 hours.  Cortisol-am, blood     Status: None   Collection Time: 04/28/21  8:03 AM  Result Value Ref Range   Cortisol - AM 14.5 6.7 - 22.6 ug/dL    Comment: Performed at [facility], [place] [address], [place], [place] [address]  Glucose, capillary     Status: None   Collection Time: 04/28/21 12:57 PM  Result Value Ref Range   Glucose-Capillary 96 70 - 99 mg/dL    Comment: Glucose reference range applies only to samples taken after fasting for at least 8 hours.  Glucose, capillary     Status: Abnormal   Collection Time: 04/28/21  4:32 PM  Result Value Ref Range   Glucose-Capillary 64 (L) 70 - 99 mg/dL    Comment: Glucose reference range applies only to samples taken after fasting for at least 8 hours.  Glucose, capillary     Status: Abnormal   Collection Time: 04/28/21  6:17 PM  Result Value Ref Range   Glucose-Capillary 103 (H) 70 - 99 mg/dL    Comment: Glucose reference range applies only to samples  taken after fasting for at least 8 hours.  Glucose, capillary     Status: Abnormal   Collection Time: 04/28/21  9:15 PM  Result Value Ref Range   Glucose-Capillary 63 (L) 70 - 99 mg/dL    Comment: Glucose reference range applies only to samples taken after fasting for at least 8 hours.   Comment 1 Notify RN    Comment 2 Document in Chart   Glucose, capillary     Status: None   Collection Time: 04/28/21  9:41 PM  Result Value Ref Range   Glucose-Capillary 79 70 - 99 mg/dL    Comment: Glucose reference range applies only to samples taken after fasting for at least 8 hours.  Glucose, capillary     Status: Abnormal   Collection Time: 04/28/21 11:30 PM  Result Value Ref Range   Glucose-Capillary 146 (H) 70 - 99 mg/dL    Comment: Glucose reference range applies only to samples taken after fasting for at least 8 hours.   Comment 1 Notify RN    Comment 2 Document in Chart   Glucose, capillary     Status: Abnormal   Collection Time: 04/29/21  3:48 AM  Result Value Ref Range   Glucose-Capillary 104 (H) 70 - 99 mg/dL    Comment: Glucose reference range applies only to samples taken after fasting for at least 8 hours.  Glucose, capillary     Status: None   Collection Time: 04/29/21  6:59 AM  Result Value Ref Range   Glucose-Capillary 85 70 - 99 mg/dL    Comment: Glucose reference range applies only to samples taken after fasting for at least 8 hours.    Current Facility-Administered Medications  Medication Dose Route Frequency Provider Last Rate Last Admin  . 0.9 %  sodium chloride infusion (Manually program via Guardrails IV Fluids)   Intravenous Once Jennelle Human B, NP      . acetaminophen (TYLENOL) tablet 650 mg  650 mg Oral Q6H PRN Aslam, Sadia, MD      . cefTRIAXone (ROCEPHIN) 2 g in sodium chloride 0.9 % 100 mL IVPB  2 g Intravenous Q24H Audria Nine, DO 200 mL/hr at 04/28/21 1113 2 g at 04/28/21 1113  . chlorhexidine gluconate (MEDLINE KIT) (PERIDEX) 0.12 % solution 15 mL  15  mL Mouth Rinse BID Spero Geralds, MD   15 mL at 04/29/21 0927  . diazepam (VALIUM) tablet 5 mg  5 mg Oral BID Damita Dunnings B, MD   5 mg at 04/29/21 7622  . docusate sodium (COLACE) capsule 100 mg  100 mg Oral BID PRN Spero Geralds, MD      . enoxaparin (LOVENOX) injection 40 mg  40 mg Subcutaneous QHS Audria Nine, DO   40 mg at 04/28/21 2137  . feeding supplement (ENSURE ENLIVE / ENSURE PLUS) liquid 237 mL  237 mL Oral TID BM Kc, Ramesh, MD   237 mL at 04/29/21 1102  . ferrous sulfate tablet 325 mg  325 mg Oral Q breakfast Kc, Ramesh, MD   325 mg at 04/28/21 0758  . folic acid (FOLVITE) tablet 1 mg  1 mg Oral Daily Pham, Minh Q, RPH-CPP   1 mg at 04/29/21 1059  . HYDROcodone-acetaminophen (NORCO/VICODIN) 5-325 MG per tablet 1 tablet  1 tablet Oral Q6H PRN Vincent Pert, MD   1 tablet at 04/29/21 0214  . MEDLINE mouth rinse  15 mL Mouth Rinse 10 times per day Spero Geralds, MD   15 mL at 04/29/21 1000  . methadone (DOLOPHINE) tablet 50 mg  50 mg Oral Daily Kc, Ramesh, MD   50 mg at 04/29/21 1100  . multivitamin with minerals tablet 1 tablet  1 tablet Oral Daily Audria Nine, DO   1 tablet at 04/29/21 1059  . perampanel (FYCOMPA) tablet 4 mg  4 mg Oral QHS Audria Nine, DO   4 mg at 04/28/21 2137  . polyethylene glycol (MIRALAX / GLYCOLAX) packet 17 g  17 g Oral Daily PRN Spero Geralds, MD      . prazosin (MINIPRESS) capsule 1 mg  1 mg Oral QHS Damita Dunnings B, MD   1 mg at 04/28/21 2300  . pregabalin (LYRICA) capsule 200 mg  200 mg Oral TID Audria Nine, DO   200 mg at 04/29/21 1059  . promethazine (PHENERGAN) 6.25 mg in sodium chloride 0.9 % 50 mL IVPB  6.25 mg Intravenous Q6H PRN Kc, Ramesh, MD      . promethazine (PHENERGAN) tablet 12.5 mg  12.5 mg Oral Q6H PRN Aslam, Sadia, MD   12.5 mg at 04/28/21 1411  . thiamine tablet 100 mg  100 mg Oral Daily [place], [place]  Q, RPH-CPP   100 mg at 04/29/21 1059  . valproic acid (DEPAKENE) 250 MG/5ML solution 250 mg  250 mg Oral QAC  breakfast Damita Dunnings B, MD   250 mg at 04/29/21 0635  . valproic acid (DEPAKENE) 250 MG/5ML solution 750 mg  750 mg Oral Q2000 Damita Dunnings B, MD   750 mg at 04/28/21 2100     Psychiatric Specialty Exam:  Presentation  General Appearance: Bizarre  Eye Contact:Fair  Speech:Slow  Speech Volume:Decreased  Handedness:No data recorded  Mood and Affect  Mood:-- ("terrible")  Affect:Congruent   Thought Process  Thought Processes:Linear  Descriptions of Associations:Circumstantial  Orientation:Full (Time, Place and Person)  Thought Content:Logical  History of Schizophrenia/Schizoaffective disorder:No data recorded Duration of Psychotic Symptoms:No data recorded Hallucinations:Hallucinations: None  Ideas of Reference:None  Suicidal Thoughts:Suicidal Thoughts: No  Homicidal Thoughts:Homicidal Thoughts: No   Sensorium  Memory:Immediate Good; Recent Good; Remote Good  Judgment:Impaired  Insight:Shallow   Executive Functions  Concentration:Fair  Attention Span:Fair  Recall:No data recorded Fund of Knowledge:Good  Language:Good   Psychomotor Activity  Psychomotor Activity:Psychomotor Activity: Decreased   Assets  Assets:Communication Skills; Resilience; Social Support; Housing   Sleep  Sleep:Sleep: Good   Physical Exam: Physical Exam HENT:     Head: Normocephalic and atraumatic.  Pulmonary:     Effort: Pulmonary effort is normal.  Skin:    General: Skin is dry.  Neurological:     Mental Status: He is alert and oriented to person, place, and time.   Review of Systems  Constitutional:        Edematous  Gastrointestinal:  Positive for vomiting.  Musculoskeletal:  Positive for back pain and myalgias.  Psychiatric/Behavioral:  Negative for hallucinations and suicidal ideas.   Blood pressure (!) 99/53, pulse 88, temperature (!) 97.5 F (36.4 C), temperature source Oral, resp. rate 16, height 5' 10"  (1.778 m), weight 75.5 kg, SpO2 98 %. Body  mass index is 23.88 kg/m.   PGY-2 Freida Busman, MD 04/29/2021 11:25 AM

## 2021-04-29 NOTE — Progress Notes (Signed)
Pt vomited three times while taking morning medications, with sips of water and Ensure between. Clear emesis. MD notified.

## 2021-04-29 NOTE — Progress Notes (Signed)
Physical Therapy Treatment Patient Details Name: Vincent Green MRN: [id number] DOB: 15-Oct-1995 Today's Date: 04/29/2021   History of Present Illness Vincent Green, 25 y.o. male with PMH of PTSD, depression/anxiety, seizure disorder, microcytic anemia, and polysubstance use admitted for acute metabolic toxic encephalopathy with breakthrough seizure in setting of polysubstance use at [facility] ED where he was intubated for acute hypoxic respiratory failure initially with concerns of aspiration pneumonitis, transferred to [facility], ICU.  Extubated 04/26/21.    PT Comments    Patient progressing with less episodes of L LE buckling and when focused and cued sequencing better with RW. He is constantly distracted needing mod cues for focus on task.  Still appropriate for inpatient rehab, however, if he decides on home would benefit from outpatient PT.  PT to continue to follow acutely.    Recommendations for follow up therapy are one component of a multi-disciplinary discharge planning process, led by the attending physician.  Recommendations may be updated based on patient status, additional functional criteria and insurance authorization.  Follow Up Recommendations  Acute inpatient rehab (3hours/day)     Assistance Recommended at Discharge Intermittent Supervision/Assistance  Equipment Recommendations  BSC/3in1;Rolling walker (2 wheels)    Recommendations for Other Services       Precautions / Restrictions Precautions Precautions: Fall Precaution Comments: L LE weakness, seizure     Mobility  Bed Mobility   Bed Mobility: Supine to Sit     Supine to sit: Supervision;HOB elevated     General bed mobility comments: assist for lines, mod cues for task completion due to distraction    Transfers Overall transfer level: Needs assistance Equipment used: Rolling walker (2 wheels) Transfers: Sit to/from Stand Sit to Stand: Min guard;Min assist           General transfer comment:  cues for hand placement; initally pulling up on walker    Ambulation/Gait Ambulation/Gait assistance: Min assist;Mod assist Gait Distance (Feet): 120 Feet Assistive device: Rolling walker (2 wheels) Gait Pattern/deviations: Step-through pattern;Decreased stride length;Knees buckling;Wide base of support       General Gait Details: mod to max cues for sequencing with walker, for forward gaze, and focus on task,  only 1-2 episodes of L LE buckling, but pt with head down and flexed posture at times not moving walker forward enough and assist to prevent anterior LOB; greatly increased time needed for ambulation   Stairs             Wheelchair Mobility    Modified Rankin (Stroke Patients Only)       Balance Overall balance assessment: Needs assistance Sitting-balance support: Feet supported Sitting balance-Leahy Scale: Fair     Standing balance support: Bilateral upper extremity supported Standing balance-Leahy Scale: Poor Standing balance comment: UE support for balance                            Cognition Arousal/Alertness: Awake/alert Behavior During Therapy: WFL for tasks assessed/performed;Impulsive;Anxious Overall Cognitive Status: No family/caregiver present to determine baseline cognitive functioning                                 General Comments: hyperverbal, overly self diagnostic, multimodal cues for safety with mobility and for focusing on task        Exercises      General Comments General comments (skin integrity, edema, etc.): LE edema improved today compared to yesterday,  pt relates continued L LE numbness      Pertinent Vitals/Pain Faces Pain Scale: Hurts a little bit Pain Location: generalized Pain Descriptors / Indicators: Discomfort;Grimacing Pain Intervention(s): Monitored during session    Home Living                          Prior Function            PT Goals (current goals can now be found  in the care plan section) Progress towards PT goals: Progressing toward goals    Frequency    Min 3X/week      PT Plan Current plan remains appropriate    Co-evaluation              AM-PAC PT "6 Clicks" Mobility   Outcome Measure  Help needed turning from your back to your side while in a flat bed without using bedrails?: A Little Help needed moving from lying on your back to sitting on the side of a flat bed without using bedrails?: A Little Help needed moving to and from a bed to a chair (including a wheelchair)?: A Little Help needed standing up from a chair using your arms (e.g., wheelchair or bedside chair)?: A Little Help needed to walk in hospital room?: A Lot Help needed climbing 3-5 steps with a railing? : Total 6 Click Score: 15    End of Session Equipment Utilized During Treatment: Gait belt Activity Tolerance: Patient tolerated treatment well Patient left: in chair;with chair alarm set;with call bell/phone within reach   PT Visit Diagnosis: Muscle weakness (generalized) (M62.81);Other abnormalities of gait and mobility (R26.89)     Time: 5170-0174 PT Time Calculation (min) (ACUTE ONLY): 39 min  Charges:  $Gait Training: 8-22 mins $Therapeutic Activity: 8-22 mins $Self Care/Home Management: 8-22                     Sheran Lawless, PT Acute Rehabilitation Services Pager:[phone] [phone] 04/29/2021    Vincent Green 04/29/2021, 4:33 PM

## 2021-04-29 NOTE — Progress Notes (Addendum)
PROGRESS NOTE    Vincent Green  ZJQ:[id number] DOB: February 17, 1996 DOA: 04/24/2021 PCP: Vincent Douglas, MD   Chief Complaint  Patient presents with   Edema  Brief Narrative/Hospital Course:  Vincent Green, 25 y.o. male with PMH of PTSD, depression/anxiety, seizure disorder, microcytic anemia, and polysubstance use admitted for acute metabolic toxic encephalopathy with breakthrough seizure in setting of polysubstance use at [facility] ED where he was intubated for acute hypoxic respiratory failure initially with concerns of aspiration pneumonitis, transferred to [facility], ICU. Has been covered with vancomycin-switched to rocephin for this and also for bilateral lower extremity cellulitis he was extubated 11/8 to follow nasal cannula seen by neurology in the ED adjusted. Transferred to [facility] 11/9.  Patient is slow to respond but alert awake oriented x3.  Psychiatry service consulted suspecting possible bipolar, psych meds has been adjusted.  Patient is also on chronic methadone verified with his father and with methadone clinic by nursing staff 11/9 and placed back with plan to titrate to home dose of 90 mg It appears patient has been having poor appetite lost almost 20% weight past 2 months and following up with GI for possible esophageal dilatation. RD following and diet augmented. Has been having issues with hypoglycemia-needing D5 cortisol level adequate.  Liberalizing diet.  Remains deconditioned and weak and need inpatient rehab and waiting for placement.   Subjective:  Seen this morning.  Waking up from sleep no complaint.  Sugar remains stable and weaning off IV dextrose.   Encouraged to take orally.  Denies any other complaints   Assessment & Plan:  Acute hypoxemic respiratory failure Acute toxic encephalopathy in the setting of polypharmacy Seizure disorder: Patient was intubated and subsequently extubated 11/8.  Now on room air.  Mental status oriented x3 but somewhat slow to  respond.  Psych meds adjusted by psychiatry input is appreciated.Continue with current AEDs as per neurology.  Continue PT OT and planning for CIR once bed available.  Aspiration pneumonitis: Completed antibiotics x5 days.  Clinically improving.    Bilateral lower extremity cellulitis POA B/l Leg edema-appears chronic: Continue antibiotics as above. Edema much improved after Lasix.   Edema appears in relation to his malnutrition low albumin level.  TTE on 04/25/21:lvef 55-60%,No RWMA,his duplex on leg were negative in sept 2022.Checked duplex again and is neg for dvt.  Chronic back pain  Chronic methadone and lyrica Chronic pain:  Cont methadone at 50 mg and lyrica. Concern abt somnolence with all these meds. He claims he got seizure after getting narcan in ED at [facility]. Reprots he sees neurology for his chronic pain and had MRI abt 3 months ago.  Slowly uptitrate his methadone as tolerated  Possible Polysubstance abuse hx- uds + for benzo, amphetamine and thc. Denies substrance abuse to me. he reports he does do marijuawana and other meds are prescribed. Minimize polypharmacy.  PTSD Anxiety Depression-concern for BPD: Appreciate psychiatry input start Valium 5 mg twice daily, Depakote 250 daily /50 nightly for seizure and concern for possible bipolar disorder.  Social worker referral for outpatient psychology and outpatient psych.  Father is involved.  Thyroid function is stable.  Acute on chronic microcytic anemia:hemoglobin 6 gm on admission baseline 9 g, received 2 units transfusion, hemoglobin overall stable -although slightly downtrending 7.7 g repeat H&H today.FOBT was negative.transfusion threshold less than 7 g. Continue iron supplementation Recent Labs  Lab 04/25/21 0429 04/25/21 0442 04/25/21 1953 04/26/21 0639 04/28/21 0330  HGB 7.1* 6.5* 7.7* 8.1* 7.7*  HCT 23.7* 19.0* 25.9*  27.5* 26.0*   Dysphagia/nausea vomiting: Keep on Phenergan, PPI added. Is following up with GI as  outpatient at [facility] and mentions there was plan for EGD and esophageal dilation.  Patient again vomiting even with sips and meds this morning I will consult GI.  Diet is scaled down to DYS 3 diet. Keep on ivf . Hypoglycemia needing D10 while in ICU. Off D10, again noted hypoglycemic- needed D5 normal saline overnight .serum cortisol is adequate 14.5 ruling out Addison's disease. We will discontinue IV fluids  as tolerated. Recent Labs  Lab 04/28/21 2115 04/28/21 2141 04/28/21 2330 04/29/21 0348 04/29/21 0659  GLUCAP 63* 79 146* 104* 85     Painful penile lesion:GC/Chlamydia currently.  Has no complaints at this time.  Outpatient follow-up   Deconditioning/debility/generalized weakness continue PT OT-recommending inpatient rehab.   DVT prophylaxis: enoxaparin (LOVENOX) injection 40 mg Start: 04/26/21 2200 SCDs Start: 04/24/21 1831 Code Status:   Code Status: Full Code Family Communication: plan of care discussed with patient at bedside. Status is: Inpatient Remains inpatient appropriate because: Awaiting for CIR placement.  Remains hospitalized monitor blood sugar, ensure abel to take po.   Objective: Vitals last 24 hrs: Vitals:   04/28/21 2339 04/29/21 0308 04/29/21 0500 04/29/21 0841  BP: 120/71 107/71  (!) 99/53  Pulse: 98 82  88  Resp: _0 Temp: (!) 97.5 F (36.4 C) 98.7 F (37.1 C)  (!) 97.5 F (36.4 C)  TempSrc: Oral Oral  Oral  SpO2: 100% 99%  98%  Weight:   75.5 kg   Height:       Weight change: -0.2 kg  Intake/Output Summary (Last 24 hours) at 04/29/2021 1125 Last data filed at 04/28/2021 2206 Gross per 24 hour  Intake 398.72 ml  Output 2700 ml  Net -2301.28 ml    Net IO Since Admission: 1,422.71 mL [04/29/21 1125]   Physical Examination: General exam: AAOx 3, older than stated age, weak appearing. HEENT:Oral mucosa moist, Ear/Nose WNL grossly, dentition normal. Respiratory system: bilaterally clear, no use of accessory muscle Cardiovascular  system: S1 & S2 +, No JVD,. Gastrointestinal system: Abdomen soft, NT,ND, BS+ Nervous System:Alert, awake, moving extremities and grossly nonfocal Extremities: improved leg edema, distal peripheral pulses palpable.  Skin: No rashes,no icterus. MSK: Normal muscle bulk,tone, power   Medications reviewed:  Scheduled Meds:  sodium chloride   Intravenous Once   chlorhexidine gluconate (MEDLINE KIT)  15 mL Mouth Rinse BID   diazepam  5 mg Oral BID   enoxaparin (LOVENOX) injection  40 mg Subcutaneous QHS   feeding supplement  237 mL Oral TID BM   ferrous sulfate  325 mg Oral Q breakfast   folic acid  1 mg Oral Daily   mouth rinse  15 mL Mouth Rinse 10 times per day   methadone  50 mg Oral Daily   multivitamin with minerals  1 tablet Oral Daily   perampanel  4 mg Oral QHS   prazosin  1 mg Oral QHS   pregabalin  200 mg Oral TID   thiamine  100 mg Oral Daily   valproic acid  250 mg Oral QAC breakfast   valproic acid  750 mg Oral Q2000   Continuous Infusions:  cefTRIAXone (ROCEPHIN)  IV 2 g (04/28/21 1113)   promethazine (PHENERGAN) injection (IM or IVPB)      Diet Order             DIET DYS 3 Room service appropriate? Yes; Fluid consistency: Thin  Diet effective now                   Nutrition Problem: Moderate Malnutrition Etiology: chronic illness (COPD, polysubstance abuse, PTSD?) Signs/Symptoms: mild fat depletion, mild muscle depletion, moderate fat depletion, moderate muscle depletion, percent weight loss Percent weight loss: 20 % (within 2 months) Interventions: Ensure Enlive (each supplement provides 350kcal and 20 grams of protein), MVI, Refer to RD note for recommendations  Weight change: -0.2 kg  Wt Readings from Last 3 Encounters:  04/29/21 75.5 kg  03/01/21 95 kg  02/05/21 95.3 kg     Consultants:see note  Procedures:see note Antimicrobials: Anti-infectives (From admission, onward)    Start     Dose/Rate Route Frequency Ordered Stop   04/27/21 1000   cefTRIAXone (ROCEPHIN) 2 g in sodium chloride 0.9 % 100 mL IVPB        2 g 200 mL/hr over 30 Minutes Intravenous Every 24 hours 04/26/21 1244 04/30/21 0959   04/25/21 1530  cefTRIAXone (ROCEPHIN) 1 g in sodium chloride 0.9 % 100 mL IVPB  Status:  Discontinued        1 g 200 mL/hr over 30 Minutes Intravenous Every 24 hours 04/25/21 1436 04/26/21 1244   04/25/21 0100  vancomycin (VANCOREADY) IVPB 1500 mg/300 mL  Status:  Discontinued        1,500 mg 150 mL/hr over 120 Minutes Intravenous Every 12 hours 04/24/21 1509 04/25/21 1436   04/24/21 2300  cefTRIAXone (ROCEPHIN) 1 g in sodium chloride 0.9 % 100 mL IVPB  Status:  Discontinued        1 g 200 mL/hr over 30 Minutes Intravenous Every 24 hours 04/24/21 2209 04/24/21 2211   04/24/21 1500  Ampicillin-Sulbactam (UNASYN) 3 g in sodium chloride 0.9 % 100 mL IVPB  Status:  Discontinued        3 g 200 mL/hr over 30 Minutes Intravenous  Once 04/24/21 1445 04/24/21 1452   04/24/21 1500  ceFEPIme (MAXIPIME) 1 g in sodium chloride 0.9 % 100 mL IVPB  Status:  Discontinued        1 g 200 mL/hr over 30 Minutes Intravenous  Once 04/24/21 1453 04/24/21 1918   04/24/21 1500  azithromycin (ZITHROMAX) 500 mg in sodium chloride 0.9 % 250 mL IVPB        500 mg 250 mL/hr over 60 Minutes Intravenous  Once 04/24/21 1453 04/24/21 1808   04/24/21 1500  vancomycin (VANCOREADY) IVPB 1000 mg/200 mL        1,000 mg 200 mL/hr over 60 Minutes Intravenous  Once 04/24/21 1456 04/24/21 2109      Culture/Microbiology    Component Value Date/Time   SDES TRACHEAL ASPIRATE 04/24/2021 1919   SPECREQUEST NONE 04/24/2021 1919   CULT  04/24/2021 1919    RARE Normal respiratory flora-no Staph aureus or Pseudomonas seen Performed at [facility], [place] [address], [place], [place] [address]    REPTSTATUS 04/27/2021 FINAL 04/24/2021 1919    Other culture-see note  Unresulted Labs (From admission, onward)     Start     Ordered   04/25/21 2000  Drug Screen 10 W/Conf,  Serum  Once,   R        04/25/21 2000          Data Reviewed: I have personally reviewed following labs and imaging studies CBC: Recent Labs  Lab 04/24/21 1156 04/24/21 2015 04/24/21 2257 04/25/21 0429 04/25/21 0442 04/25/21 1953 04/26/21 0639 04/28/21 0330  WBC 6.3  --  13.0* 9.7  --   --  7.7 7.2  NEUTROABS 5.0  --   --   --   --   --   --   --   HGB 6.0*   < > 8.1* 7.1* 6.5* 7.7* 8.1* 7.7*  HCT 22.2*   < > 27.3* 23.7* 19.0* 25.9* 27.5* 26.0*  MCV 82.8  --  81.0 80.6  --   --  81.6 81.5  PLT 273  --  263 253  --   --  256 254   < > = values in this interval not displayed.    Basic Metabolic Panel: Recent Labs  Lab 04/24/21 1156 04/24/21 1949 04/24/21 2015 04/25/21 0429 04/25/21 0442 04/26/21 0639 04/28/21 0330  NA 137  --  139 137 139 135 137  K 3.6  --  3.1* 3.4* 3.3* 4.2 3.8  CL 104  --   --  110  --  107 108  CO2 26  --   --  22  --  22 23  GLUCOSE 104*  --   --  99  --  95 74  BUN 12  --   --  8  --  6 <5*  CREATININE 0.91  --   --  0.79  --  0.79 0.71  CALCIUM 8.6*  --   --  7.5*  --  8.0* 8.2*  MG  --  1.6*  --  1.9  --  1.8  --   PHOS  --  2.3*  --  3.0  --  2.9  --     GFR: Estimated Creatinine Clearance: 144.5 mL/min (by C-G formula based on SCr of 0.71 mg/dL). Liver Function Tests: Recent Labs  Lab 04/24/21 1156 04/26/21 0639  AST 44* 20  ALT 21 19  ALKPHOS 218* 130*  BILITOT 0.2* 0.5  PROT 5.8* 4.2*  ALBUMIN 2.5* 1.7*    Recent Labs  Lab 04/25/21 1419  LIPASE 23    No results for input(s): AMMONIA in the last 168 hours. Coagulation Profile: No results for input(s): INR, PROTIME in the last 168 hours. Cardiac Enzymes: No results for input(s): CKTOTAL, CKMB, CKMBINDEX, TROPONINI in the last 168 hours. BNP (last 3 results) No results for input(s): PROBNP in the last 8760 hours. HbA1C: No results for input(s): HGBA1C in the last 72 hours. CBG: Recent Labs  Lab 04/28/21 2115 04/28/21 2141 04/28/21 2330 04/29/21 0348  04/29/21 0659  GLUCAP 63* 79 146* 104* 85    Lipid Profile: No results for input(s): CHOL, HDL, LDLCALC, TRIG, CHOLHDL, LDLDIRECT in the last 72 hours. Thyroid Function Tests: Recent Labs    04/27/21 0639  TSH 2.905  FREET4 1.07  T3FREE 2.0    Anemia Panel: No results for input(s): VITAMINB12, FOLATE, FERRITIN, TIBC, IRON, RETICCTPCT in the last 72 hours.  Sepsis Labs: No results for input(s): PROCALCITON, LATICACIDVEN in the last 168 hours.  Recent Results (from the past 240 hour(s))  Culture, blood (single)     Status: None   Collection Time: 04/24/21  3:26 PM   Specimen: BLOOD RIGHT HAND  Result Value Ref Range Status   Specimen Description BLOOD RIGHT HAND  Final   Special Requests   Final    BOTTLES DRAWN AEROBIC ONLY Blood Culture adequate volume   Culture   Final    NO GROWTH 5 DAYS Performed at [facility], [address] [place], [place] [address]    Report Status 04/29/2021 FINAL  Final  Blood  culture (routine x 2)     Status: None   Collection Time: 04/24/21  3:26 PM   Specimen: BLOOD  Result Value Ref Range Status   Specimen Description BLOOD  Final   Special Requests NONE  Final   Culture   Final    NO GROWTH 5 DAYS Performed at [facility], [address], [place], [place] [address]    Report Status 04/29/2021 FINAL  Final  Blood culture (routine x 2)     Status: None   Collection Time: 04/24/21  3:26 PM   Specimen: BLOOD  Result Value Ref Range Status   Specimen Description BLOOD  Final   Special Requests NONE  Final   Culture   Final    NO GROWTH 5 DAYS Performed at [facility], [address] [place], [place] [address]    Report Status 04/29/2021 FINAL  Final  MRSA Next Gen by PCR, Nasal     Status: None   Collection Time: 04/24/21  7:18 PM   Specimen: Nasal Mucosa; Nasal Swab  Result Value Ref Range Status   MRSA by PCR Next Gen NOT DETECTED NOT DETECTED Final    Comment: (NOTE) The GeneXpert MRSA Assay (FDA approved for NASAL  specimens only), is one component of a comprehensive MRSA colonization surveillance program. It is not intended to diagnose MRSA infection nor to guide or monitor treatment for MRSA infections. Test performance is not FDA approved in patients less than 40 years old. Performed at [facility], [place] [address] [place], [place] [address]   Culture, Respiratory w Gram Stain     Status: None   Collection Time: 04/24/21  7:19 PM   Specimen: Tracheal Aspirate; Respiratory  Result Value Ref Range Status   Specimen Description TRACHEAL ASPIRATE  Final   Special Requests NONE  Final   Gram Stain   Final    RARE SQUAMOUS EPITHELIAL CELLS PRESENT ABUNDANT WBC PRESENT, PREDOMINANTLY PMN RARE GRAM POSITIVE COCCI    Culture   Final    RARE Normal respiratory flora-no Staph aureus or Pseudomonas seen Performed at [facility], [address], [place], [place] [address]    Report Status 04/27/2021 FINAL  Final  Resp Panel by RT-PCR (Flu A&B, Covid) Nasopharyngeal Swab     Status: None   Collection Time: 04/27/21  1:17 AM   Specimen: Nasopharyngeal Swab; Nasopharyngeal(NP) swabs in vial transport medium  Result Value Ref Range Status   SARS Coronavirus 2 by RT PCR NEGATIVE NEGATIVE Final    Comment: (NOTE) SARS-CoV-2 target nucleic acids are NOT DETECTED.  The SARS-CoV-2 RNA is generally detectable in upper respiratory specimens during the acute phase of infection. The lowest concentration of SARS-CoV-2 viral copies this assay can detect is 138 copies/mL. A negative result does not preclude SARS-Cov-2 infection and should not be used as the sole basis for treatment or other patient management decisions. A negative result may occur with  improper specimen collection/handling, submission of specimen other than nasopharyngeal swab, presence of viral mutation(s) within the areas targeted by this assay, and inadequate number of viral copies(<138 copies/mL). A negative result must be  combined with clinical observations, patient history, and epidemiological information. The expected result is Negative.  Fact Sheet for Patients:  EntrepreneurPulse.com.au  Fact Sheet for Healthcare Providers:  IncredibleEmployment.be  This test is no t yet approved or cleared by the [place] FDA and  has been authorized for detection and/or diagnosis of SARS-CoV-2 by FDA under an Emergency  Use Authorization (EUA). This EUA will remain  in effect (meaning this test can be used) for the duration of the COVID-19 declaration under Section 564(b)(1) of the Act, 21 U.S.C.section 360bbb-3(b)(1), unless the authorization is terminated  or revoked sooner.       Influenza A by PCR NEGATIVE NEGATIVE Final   Influenza B by PCR NEGATIVE NEGATIVE Final    Comment: (NOTE) The Xpert Xpress SARS-CoV-2/FLU/RSV plus assay is intended as an aid in the diagnosis of influenza from Nasopharyngeal swab specimens and should not be used as a sole basis for treatment. Nasal washings and aspirates are unacceptable for Xpert Xpress SARS-CoV-2/FLU/RSV testing.  Fact Sheet for Patients: EntrepreneurPulse.com.au  Fact Sheet for Healthcare Providers: IncredibleEmployment.be  This test is not yet approved or cleared by the [place] FDA and has been authorized for detection and/or diagnosis of SARS-CoV-2 by FDA under an Emergency Use Authorization (EUA). This EUA will remain in effect (meaning this test can be used) for the duration of the COVID-19 declaration under Section 564(b)(1) of the Act, 21 U.S.C. section 360bbb-3(b)(1), unless the authorization is terminated or revoked.  Performed at [facility], [place] [address] [place], [place] [address]       Radiology Studies: No results found.   LOS: 5 days   [name], MD [facility]  04/29/2021, 11:25 AM

## 2021-04-29 NOTE — Consult Note (Addendum)
Attending physician's note   I have taken an interval history, reviewed the chart and examined the patient. I agree with the Advanced Practitioner's note, impression, and recommendations as outlined.   25 year old male with medical history as outlined below, admitted on 11/6 with aspiration event requiring intubation.  Extubated on 11/8.  GI service consulted for evaluation of dysphagia along with anemia.  Labs on admission notable for the following:  - H/H 6/22 with MCV/RDW 83/20.  Transfused 2 unit PRBCs - Albumin 2.5, AST 44, ALT 21, ALP 218, T bili 0.2 - Normal renal function  Follows with [facility] and had evaluation underway for evaluation of dysphagia with pending EGD.  Also with history of IDA in 2020 with ferritin 5, iron 19, sat 5%.  Baseline Hgb ~9-10.  1) Dysphagia - EGD with esophageal dilation and biopsies tomorrow - N.p.o. at midnight - We will give Reglan 10 mg IV times 1 in the AM to promote gastric emptying (currently eating Ramen noodles and spinach as apparently the only foods that he will eat)  2) Iron deficiency anemia - Evaluate for UGI source to include gastric and duodenal biopsies - If EGD otherwise unrevealing, consideration for outpatient colonoscopy when able to tolerate prep and recovery from recent acute insults  Gerrit Heck, DO, FACG [phone] office        Consultation  Referring Provider: [facility]/ [facility] Primary Care Physician:  Leonie Douglas, MD Primary Gastroenterologist:   [facility] / Neil Crouch PA-C  Reason for Consultation:   Dysphagia, weight loss  HPI: Vincent Green is a 25 y.o. male, who was initially admitted on 04/24/2021 after he presented to the emergency room at [facility].  He had called EMS stating that he could not walk.  Per the notes while having evaluation in the emergency room he was felt to be intoxicated and given Narcan with little improvement in his symptoms, then had emesis and an aspiration event and  became hypoxic.  He required intubation and was cared for by CCM earlier this admission. He has history of anxiety, depression, PTSD, scoliosis, and polysubstance use/abuse.  Per notes he had been maintained on methadone prior to admission, and had also been taking other opioids..  There was concern for possible seizure event as well. He has undergone neuro evaluation here.  He is felt to have an acute toxic encephalopathy.  He was eventually extubated on 04/26/2021. He is also had psych consultation here, with suspicion of bipolar disorder, medications have been adjusted. And has been complaining of dysphagia and also apparently has had a weight loss of 18 to 20 pounds over the past 3 months He says he has difficulty swallowing anything hard or solid but does not have any difficulty with liquids.  He had been eating a lot of Ramen noodles at home.  He denies any episodes of dysphagia requiring regurgitation.  He says rarely he will have an episode of vomiting has no complaints of nausea.  No current complaints of abdominal pain, bowel movements have been normal no melena or hematochezia.  Denies much in the way of heartburn. He had been evaluated by [facility] earlier this year with complaints of dysphagia.  He was to be set up for EGD with dilation, he tells me today that he is on a wait list to have the procedure done. He was very concerned about why he has not been given a nicotine patch during this interview and also repeatedly asking for pain medication.  When I discussed EGD with dilation with him he asked if he would be able to have some pain medication after the procedure.  Also reviewing his labs he has had problems with a chronic anemia.  On this admission hemoglobin was 6 on admit, was transfused 2 units of packed RBCs with hemoglobin up to 7.7.  Albumin 1.7  Documented Hemoccult negative.  Hemoglobin has been in the 9-10 range earlier this year.  Reviewing back to 2020 he was documented to be  iron deficient with a ferritin of 5/serum iron 19/iron sat 5/TIBC 488  He says he has not been on any iron supplementation He has no family history of GI disease as far as he is aware  No abdominal imaging this admission, or recently.   Past Medical History:  Diagnosis Date  . ADHD   . Anxiety   . Anxiety   . Chronic dental pain   . COPD (chronic obstructive pulmonary disease) ([place])   . Insomnia   . PTSD (post-traumatic stress disorder)   . PTSD (post-traumatic stress disorder)   . Scoliosis   . Seizures (HCC)    benzo-seizure. last seizure 01/2019 per pt (documented 08/12/19)  . Tachycardia     Past Surgical History:  Procedure Laterality Date  . CHOANAL ATRESIA REPAIR    . NOSE SURGERY    . SKIN GRAFT      Prior to Admission medications   Medication Sig Start Date End Date Taking? Authorizing Provider  albuterol (PROVENTIL HFA;VENTOLIN HFA) 108 (90 Base) MCG/ACT inhaler Inhale 1-2 puffs into the lungs every 6 (six) hours as needed for wheezing or shortness of breath. 02/10/17  Yes Oxford, Orson Ape, FNP  diazepam (VALIUM) 5 MG tablet Take 5 mg by mouth every 12 (twelve) hours as needed for anxiety or muscle spasms.   Yes [provider]  divalproex (DEPAKOTE SPRINKLE) 125 MG capsule Take 250 mg by mouth 2 (two) times daily.   Yes [provider]  famotidine (PEPCID) 20 MG tablet Take 1 tablet (20 mg total) by mouth 2 (two) times daily. 10/25/20  Yes Carmin Muskrat, MD  hydrOXYzine (ATARAX/VISTARIL) 25 MG tablet Take 25 mg by mouth daily as needed for anxiety.   Yes [provider]  lisdexamfetamine (VYVANSE) 60 MG capsule Take 60 mg by mouth every morning.   Yes [provider]  methadone (DOLOPHINE) 10 MG tablet Take 90 mg by mouth daily. At [facility] clinic   Yes [provider]  metoCLOPramide (REGLAN) 5 MG tablet Take 5 mg by mouth 3 (three) times daily before meals.   Yes [provider]  naproxen (NAPROSYN) 500 MG  tablet Take 1 tablet (500 mg total) by mouth 2 (two) times daily. Take with food 02/24/21  Yes Triplett, Tammy, PA-C  perampanel (FYCOMPA) 2 MG tablet Take 4 mg by mouth daily.   Yes [provider]  polyethylene glycol powder (GLYCOLAX/MIRALAX) 17 GM/SCOOP powder See admin instructions. 07/29/19  Yes [provider]  pregabalin (LYRICA) 200 MG capsule Take 200 mg by mouth 3 (three) times daily.   Yes [provider]  promethazine (PHENERGAN) 25 MG tablet Take 1 tablet by mouth every 6 (six) hours as needed.   Yes [provider]  linaclotide (LINZESS) 290 MCG CAPS capsule Take 1 capsule (290 mcg total) by mouth daily before breakfast. Patient not taking: No sig reported 08/12/19   Mahala Menghini, PA-C  LORazepam (ATIVAN) 1 MG tablet lorazepam 1 mg tablet  Take 1 tablet  twice a day by oral route as needed. Patient not taking: No sig reported    [provider]  diphenhydrAMINE (BENADRYL) 25 mg capsule Take 25 mg by mouth every 6 (six) hours as needed.  10/14/19  [provider]  fluticasone (FLONASE) 50 MCG/ACT nasal spray Place 1 spray into both nostrils daily. Patient not taking: Reported on 03/04/2019 02/10/17 03/13/19  Caccavale, Sophia, PA-C  omeprazole (PRILOSEC) 20 MG capsule Take 1 capsule (20 mg total) by mouth 2 (two) times daily before a meal. Patient not taking: No sig reported 08/12/19 10/25/20  Mahala Menghini, PA-C    Current Facility-Administered Medications  Medication Dose Route Frequency Provider Last Rate Last Admin  . 0.9 %  sodium chloride infusion (Manually program via Guardrails IV Fluids)   Intravenous Once Jennelle Human B, NP      . acetaminophen (TYLENOL) tablet 650 mg  650 mg Oral Q6H PRN Aslam, Sadia, MD      . chlorhexidine gluconate (MEDLINE KIT) (PERIDEX) 0.12 % solution 15 mL  15 mL Mouth Rinse BID Spero Geralds, MD   15 mL at 04/29/21 0927  . dextrose 5 %-0.9 % sodium chloride infusion   Intravenous Continuous [name], MD 50 mL/hr at 04/29/21 1335 New Bag at 04/29/21 1335  . diazepam (VALIUM) tablet 5 mg  5 mg Oral BID Damita Dunnings B, MD   5 mg at 04/29/21 1062  . docusate sodium (COLACE) capsule 100 mg  100 mg Oral BID PRN Spero Geralds, MD      . Derrill Memo ON 05/01/2021] enoxaparin (LOVENOX) injection 40 mg  40 mg Subcutaneous QHS Esterwood, Amy S, PA-C      . feeding supplement (ENSURE ENLIVE / ENSURE PLUS) liquid 237 mL  237 mL Oral TID BM Kc, Ramesh, MD   237 mL at 04/29/21 1102  . ferrous sulfate tablet 325 mg  325 mg Oral Q breakfast Kc, Ramesh, MD   325 mg at 04/28/21 0758  . folic acid (FOLVITE) tablet 1 mg  1 mg Oral Daily Pham, Minh Q, RPH-CPP   1 mg at 04/29/21 1059  . HYDROcodone-acetaminophen (NORCO/VICODIN) 5-325 MG per tablet 1 tablet  1 tablet Oral Q6H PRN [name], MD   1 tablet at 04/29/21 0214  . MEDLINE mouth rinse  15 mL Mouth Rinse 10 times per day Spero Geralds, MD   15 mL at 04/29/21 1336  . methadone (DOLOPHINE) tablet 50 mg  50 mg Oral Daily Kc, Ramesh, MD   50 mg at 04/29/21 1100  . multivitamin with minerals tablet 1 tablet  1 tablet Oral Daily Audria Nine, DO   1 tablet at 04/29/21 1059  . pantoprazole (PROTONIX) injection 40 mg  40 mg Intravenous Q24H Kc, Ramesh, MD      . perampanel (FYCOMPA) tablet 4 mg  4 mg Oral QHS Audria Nine, DO   4 mg at 04/28/21 2137  . polyethylene glycol (MIRALAX / GLYCOLAX) packet 17 g  17 g Oral Daily PRN Spero Geralds, MD      . prazosin (MINIPRESS) capsule 1 mg  1 mg Oral QHS Damita Dunnings B, MD   1 mg at 04/28/21 2300  . pregabalin (LYRICA) capsule 200 mg  200 mg Oral TID Audria Nine, DO   200 mg at 04/29/21 1059  . promethazine (PHENERGAN) tablet 12.5 mg  12.5 mg Oral Q6H PRN Harvie Heck, MD   12.5 mg at 04/28/21 1411  . thiamine tablet 100 mg  100 mg Oral Daily Pham, [place] Q, RPH-CPP   100 mg at 04/29/21 1059  . valproic acid (DEPAKENE) 250 MG/5ML solution 250 mg  250 mg Oral QAC breakfast Damita Dunnings B, MD   250  mg at 04/29/21 0635  . valproic acid (DEPAKENE) 250 MG/5ML solution 750 mg  750 mg Oral Q2000 Damita Dunnings B, MD   750 mg at 04/28/21 2100    Allergies as of 04/24/2021 - Review Complete 04/24/2021  Allergen Reaction Noted  . Tessalon [benzonatate] Swelling 04/28/2016    Family History  Problem Relation Age of Onset  . Other Mother 26       04/2019 autopsy pending  . GER disease Paternal Grandmother   . Colon cancer Neg Hx     Social History   Socioeconomic History  . Marital status: Single    Spouse name: Not on file  . Number of children: Not on file  . Years of education: Not on file  . Highest education level: Not on file  Occupational History  . Not on file  Tobacco Use  . Smoking status: Every Day    Packs/day: 0.50    Types: Cigarettes  . Smokeless tobacco: Never  . Tobacco comments:    smokes 6 cigarettes daily  Vaping Use  . Vaping Use: Former  Substance and Sexual Activity  . Alcohol use: No  . Drug use: Not Currently    Types: Benzodiazepines, Marijuana  . Sexual activity: Not on file  Other Topics Concern  . Not on file  Social History Narrative  . Not on file   Social Determinants of Health   Financial Resource Strain: Not on file  Food Insecurity: Not on file  Transportation Needs: Not on file  Physical Activity: Not on file  Stress: Not on file  Social Connections: Not on file  Intimate Partner Violence: Not on file    Review of Systems: Pertinent positive and negative review of systems were noted in the above HPI section.  All other review of systems was otherwise negative.   Physical Exam: Vital signs in last 24 hours: Temp:  [97.5 F (36.4 C)-98.7 F (37.1 C)] 97.5 F (36.4 C) (11/11 0841) Pulse Rate:  [77-98] 88 (11/11 0841) Resp:  [13-20] 16 (11/11 0841) BP: (99-120)/(53-89) 99/53 (11/11 0841) SpO2:  [98 %-100 %] 98 % (11/11 0841) Weight:  [75.5 kg] 75.5 kg (11/11 0500) Last BM Date: 04/27/21 General:   Alert,   Well-developed, young white male, alert, speech slow but appropriate Head:  Normocephalic and atraumatic. Eyes:  Sclera clear, no icterus.   Conjunctiva pink. Ears:  Normal auditory acuity. Nose:  No deformity, discharge,  or lesions. Mouth:  No deformity or lesions.  Edentulous Neck:  Supple; no masses or thyromegaly. Lungs:  Clear throughout to auscultation.   No wheezes, crackles, or rhonchi. Heart:  Regular rate and rhythm; no murmurs, clicks, rubs,  or gallops. Abdomen:  Soft,nontender, BS active,nonpalp mass or hsm.   Rectal: Not done, documented heme negative this admission Msk:  Symmetrical without gross deformities. . Pulses:  Normal pulses noted. Extremities:  Without clubbing or edema. Neurologic:  Alert and  oriented x4;  . Skin:  Intact without significant lesions or rashes.. Psych:  Alert and cooperative.  Intake/Output from previous day: 11/10 0701 - 11/11 0700 In: 398.7 [P.O.:236; I.V.:62.7; IV Piggyback:100] Out: 2700 [Urine:2700] Intake/Output this shift: Total I/O In: 120 [P.O.:120] Out: 1200 [Urine:1200]  Lab Results: Recent Labs    04/28/21 0330 04/29/21 1221  WBC 7.2  --   HGB 7.7* 8.6*  HCT 26.0* 29.9*  PLT 254  --    BMET Recent Labs    04/28/21 0330  NA 137  K 3.8  CL 108  CO2 23  GLUCOSE 74  BUN <5*  CREATININE 0.71  CALCIUM 8.2*   LFT No results for input(s): PROT, ALBUMIN, AST, ALT, ALKPHOS, BILITOT, BILIDIR, IBILI in the last 72 hours. PT/INR No results for input(s): LABPROT, INR in the last 72 hours. Hepatitis Panel No results for input(s): HEPBSAG, HCVAB, HEPAIGM, HEPBIGM in the last 72 hours.    IMPRESSION:  #46 25 year old white male admitted with acute metabolic toxic encephalopathy, breakthrough seizure, and acute hypoxic respiratory event secondary to aspiration while in the ER on admission. #2 chronic polysubstance use, on multiple psychotropics and methadone #3 PTSD #4.  Anxiety/depression/possible bipolar  disorder #5.  History of seizure disorder #6 dysphagia-chronic-rule out esophageal stricture or dysmotility #7 weight loss 20+ pounds over the past 3 months likely multifactoral certainly in part secondary to dietary choices and dysphagia #8 malnutrition with hypoalbuminemia #9 chronic anemia previous documented iron deficiency anemia unclear etiology suspect malabsorption  PLAN: Keep n.p.o. after midnight Hold Lovenox Schedule for EGD with esophageal dilation if appropriate with Dr. Bryan Lemma for tomorrow 04/30/2021.  Procedure was discussed in detail with the patient including indications risks and benefits and he is agreeable to proceed  Iron studies TTG/IgA  Further work-up pending results of above.  On discharge patient can resume GI care with [place] PA-C 04/29/2021, 1:39 PM

## 2021-04-29 NOTE — H&P (View-Only) (Signed)
   Attending physician's note   I have taken an interval history, reviewed the chart and examined the patient. I agree with the Advanced Practitioner's note, impression, and recommendations as outlined.   25-year-old male with medical history as outlined below, admitted on 11/6 with aspiration event requiring intubation.  Extubated on 11/8.  GI service consulted for evaluation of dysphagia along with anemia.  Labs on admission notable for the following:  - H/H 6/22 with MCV/RDW 83/20.  Transfused 2 unit PRBCs - Albumin 2.5, AST 44, ALT 21, ALP 218, T bili 0.2 - Normal renal function  Follows with [facility] and had evaluation underway for evaluation of dysphagia with pending EGD.  Also with history of IDA in 2020 with ferritin 5, iron 19, sat 5%.  Baseline Hgb ~9-10.  1) Dysphagia - EGD with esophageal dilation and biopsies tomorrow - N.p.o. at midnight - We will give Reglan 10 mg IV times 1 in the AM to promote gastric emptying (currently eating Ramen noodles and spinach as apparently the only foods that he will eat)  2) Iron deficiency anemia - Evaluate for UGI source to include gastric and duodenal biopsies - If EGD otherwise unrevealing, consideration for outpatient colonoscopy when able to tolerate prep and recovery from recent acute insults  Aldean Suddeth, DO, FACG [phone] office        Consultation  Referring Provider: [facility]/ [facility] Primary Care Physician:  Browning, Douglas, MD Primary Gastroenterologist:   [facility] / Leslie lewis PA-C  Reason for Consultation:   Dysphagia, weight loss  HPI: Vincent Green is a 25 y.o. male, who was initially admitted on 04/24/2021 after he presented to the emergency room at [facility].  He had called EMS stating that he could not walk.  Per the notes while having evaluation in the emergency room he was felt to be intoxicated and given Narcan with little improvement in his symptoms, then had emesis and an aspiration event and  became hypoxic.  He required intubation and was cared for by CCM earlier this admission. He has history of anxiety, depression, PTSD, scoliosis, and polysubstance use/abuse.  Per notes he had been maintained on methadone prior to admission, and had also been taking other opioids..  There was concern for possible seizure event as well. He has undergone neuro evaluation here.  He is felt to have an acute toxic encephalopathy.  He was eventually extubated on 04/26/2021. He is also had psych consultation here, with suspicion of bipolar disorder, medications have been adjusted. And has been complaining of dysphagia and also apparently has had a weight loss of 18 to 20 pounds over the past 3 months He says he has difficulty swallowing anything hard or solid but does not have any difficulty with liquids.  He had been eating a lot of Ramen noodles at home.  He denies any episodes of dysphagia requiring regurgitation.  He says rarely he will have an episode of vomiting has no complaints of nausea.  No current complaints of abdominal pain, bowel movements have been normal no melena or hematochezia.  Denies much in the way of heartburn. He had been evaluated by [facility] earlier this year with complaints of dysphagia.  He was to be set up for EGD with dilation, he tells me today that he is on a wait list to have the procedure done. He was very concerned about why he has not been given a nicotine patch during this interview and also repeatedly asking for pain medication.    When I discussed EGD with dilation with him he asked if he would be able to have some pain medication after the procedure.  Also reviewing his labs he has had problems with a chronic anemia.  On this admission hemoglobin was 6 on admit, was transfused 2 units of packed RBCs with hemoglobin up to 7.7.  Albumin 1.7  Documented Hemoccult negative.  Hemoglobin has been in the 9-10 range earlier this year.  Reviewing back to 2020 he was documented to be  iron deficient with a ferritin of 5/serum iron 19/iron sat 5/TIBC 488  He says he has not been on any iron supplementation He has no family history of GI disease as far as he is aware  No abdominal imaging this admission, or recently.   Past Medical History:  Diagnosis Date  . ADHD   . Anxiety   . Anxiety   . Chronic dental pain   . COPD (chronic obstructive pulmonary disease) (HCC)   . Insomnia   . PTSD (post-traumatic stress disorder)   . PTSD (post-traumatic stress disorder)   . Scoliosis   . Seizures (HCC)    benzo-seizure. last seizure 01/2019 per pt (documented 08/12/19)  . Tachycardia     Past Surgical History:  Procedure Laterality Date  . CHOANAL ATRESIA REPAIR    . NOSE SURGERY    . SKIN GRAFT      Prior to Admission medications   Medication Sig Start Date End Date Taking? Authorizing Provider  albuterol (PROVENTIL HFA;VENTOLIN HFA) 108 (90 Base) MCG/ACT inhaler Inhale 1-2 puffs into the lungs every 6 (six) hours as needed for wheezing or shortness of breath. 02/10/17  Yes Oxford, William J, FNP  diazepam (VALIUM) 5 MG tablet Take 5 mg by mouth every 12 (twelve) hours as needed for anxiety or muscle spasms.   Yes [provider]  divalproex (DEPAKOTE SPRINKLE) 125 MG capsule Take 250 mg by mouth 2 (two) times daily.   Yes [provider]  famotidine (PEPCID) 20 MG tablet Take 1 tablet (20 mg total) by mouth 2 (two) times daily. 10/25/20  Yes Lockwood, Robert, MD  hydrOXYzine (ATARAX/VISTARIL) 25 MG tablet Take 25 mg by mouth daily as needed for anxiety.   Yes [provider]  lisdexamfetamine (VYVANSE) 60 MG capsule Take 60 mg by mouth every morning.   Yes [provider]  methadone (DOLOPHINE) 10 MG tablet Take 90 mg by mouth daily. At [facility]   Yes [provider]  metoCLOPramide (REGLAN) 5 MG tablet Take 5 mg by mouth 3 (three) times daily before meals.   Yes [provider]  naproxen (NAPROSYN) 500 MG  tablet Take 1 tablet (500 mg total) by mouth 2 (two) times daily. Take with food 02/24/21  Yes Triplett, Tammy, PA-C  perampanel (FYCOMPA) 2 MG tablet Take 4 mg by mouth daily.   Yes [provider]  polyethylene glycol powder (GLYCOLAX/MIRALAX) 17 GM/SCOOP powder See admin instructions. 07/29/19  Yes [provider]  pregabalin (LYRICA) 200 MG capsule Take 200 mg by mouth 3 (three) times daily.   Yes [provider]  promethazine (PHENERGAN) 25 MG tablet Take 1 tablet by mouth every 6 (six) hours as needed.   Yes [provider]  linaclotide (LINZESS) 290 MCG CAPS capsule Take 1 capsule (290 mcg total) by mouth daily before breakfast. Patient not taking: No sig reported 08/12/19   Lewis, Leslie S, PA-C  LORazepam (ATIVAN) 1 MG tablet lorazepam 1 mg tablet  Take 1 tablet   twice a day by oral route as needed. Patient not taking: No sig reported    [provider]  diphenhydrAMINE (BENADRYL) 25 mg capsule Take 25 mg by mouth every 6 (six) hours as needed.  10/14/19  [provider]  fluticasone (FLONASE) 50 MCG/ACT nasal spray Place 1 spray into both nostrils daily. Patient not taking: Reported on 03/04/2019 02/10/17 03/13/19  Caccavale, Sophia, PA-C  omeprazole (PRILOSEC) 20 MG capsule Take 1 capsule (20 mg total) by mouth 2 (two) times daily before a meal. Patient not taking: No sig reported 08/12/19 10/25/20  Lewis, Leslie S, PA-C    Current Facility-Administered Medications  Medication Dose Route Frequency Provider Last Rate Last Admin  . 0.9 %  sodium chloride infusion (Manually program via Guardrails IV Fluids)   Intravenous Once Simpson, Paula B, NP      . acetaminophen (TYLENOL) tablet 650 mg  650 mg Oral Q6H PRN Aslam, Sadia, MD      . chlorhexidine gluconate (MEDLINE KIT) (PERIDEX) 0.12 % solution 15 mL  15 mL Mouth Rinse BID Desai, Nikita S, MD   15 mL at 04/29/21 0927  . dextrose 5 %-0.9 % sodium chloride infusion   Intravenous Continuous Kc,  Ramesh, MD 50 mL/hr at 04/29/21 1335 New Bag at 04/29/21 1335  . diazepam (VALIUM) tablet 5 mg  5 mg Oral BID McQuilla, Jai B, MD   5 mg at 04/29/21 0927  . docusate sodium (COLACE) capsule 100 mg  100 mg Oral BID PRN Desai, Nikita S, MD      . [START ON 05/01/2021] enoxaparin (LOVENOX) injection 40 mg  40 mg Subcutaneous QHS Esterwood, Amy S, PA-C      . feeding supplement (ENSURE ENLIVE / ENSURE PLUS) liquid 237 mL  237 mL Oral TID BM Kc, Ramesh, MD   237 mL at 04/29/21 1102  . ferrous sulfate tablet 325 mg  325 mg Oral Q breakfast Kc, Ramesh, MD   325 mg at 04/28/21 0758  . folic acid (FOLVITE) tablet 1 mg  1 mg Oral Daily Pham, Minh Q, RPH-CPP   1 mg at 04/29/21 1059  . HYDROcodone-acetaminophen (NORCO/VICODIN) 5-325 MG per tablet 1 tablet  1 tablet Oral Q6H PRN Kc, Ramesh, MD   1 tablet at 04/29/21 0214  . MEDLINE mouth rinse  15 mL Mouth Rinse 10 times per day Desai, Nikita S, MD   15 mL at 04/29/21 1336  . methadone (DOLOPHINE) tablet 50 mg  50 mg Oral Daily Kc, Ramesh, MD   50 mg at 04/29/21 1100  . multivitamin with minerals tablet 1 tablet  1 tablet Oral Daily Marshall, Jessica, DO   1 tablet at 04/29/21 1059  . pantoprazole (PROTONIX) injection 40 mg  40 mg Intravenous Q24H Kc, Ramesh, MD      . perampanel (FYCOMPA) tablet 4 mg  4 mg Oral QHS Marshall, Jessica, DO   4 mg at 04/28/21 2137  . polyethylene glycol (MIRALAX / GLYCOLAX) packet 17 g  17 g Oral Daily PRN Desai, Nikita S, MD      . prazosin (MINIPRESS) capsule 1 mg  1 mg Oral QHS McQuilla, Jai B, MD   1 mg at 04/28/21 2300  . pregabalin (LYRICA) capsule 200 mg  200 mg Oral TID Marshall, Jessica, DO   200 mg at 04/29/21 1059  . promethazine (PHENERGAN) tablet 12.5 mg  12.5 mg Oral Q6H PRN Aslam, Sadia, MD   12.5 mg at 04/28/21 1411  . thiamine tablet 100 mg    100 mg Oral Daily Pham, Minh Q, RPH-CPP   100 mg at 04/29/21 1059  . valproic acid (DEPAKENE) 250 MG/5ML solution 250 mg  250 mg Oral QAC breakfast McQuilla, Jai B, MD   250  mg at 04/29/21 0635  . valproic acid (DEPAKENE) 250 MG/5ML solution 750 mg  750 mg Oral Q2000 McQuilla, Jai B, MD   750 mg at 04/28/21 2100    Allergies as of 04/24/2021 - Review Complete 04/24/2021  Allergen Reaction Noted  . Tessalon [benzonatate] Swelling 04/28/2016    Family History  Problem Relation Age of Onset  . Other Mother 46       04/2019 autopsy pending  . GER disease Paternal Grandmother   . Colon cancer Neg Hx     Social History   Socioeconomic History  . Marital status: Single    Spouse name: Not on file  . Number of children: Not on file  . Years of education: Not on file  . Highest education level: Not on file  Occupational History  . Not on file  Tobacco Use  . Smoking status: Every Day    Packs/day: 0.50    Types: Cigarettes  . Smokeless tobacco: Never  . Tobacco comments:    smokes 6 cigarettes daily  Vaping Use  . Vaping Use: Former  Substance and Sexual Activity  . Alcohol use: No  . Drug use: Not Currently    Types: Benzodiazepines, Marijuana  . Sexual activity: Not on file  Other Topics Concern  . Not on file  Social History Narrative  . Not on file   Social Determinants of Health   Financial Resource Strain: Not on file  Food Insecurity: Not on file  Transportation Needs: Not on file  Physical Activity: Not on file  Stress: Not on file  Social Connections: Not on file  Intimate Partner Violence: Not on file    Review of Systems: Pertinent positive and negative review of systems were noted in the above HPI section.  All other review of systems was otherwise negative.   Physical Exam: Vital signs in last 24 hours: Temp:  [97.5 F (36.4 C)-98.7 F (37.1 C)] 97.5 F (36.4 C) (11/11 0841) Pulse Rate:  [77-98] 88 (11/11 0841) Resp:  [13-20] 16 (11/11 0841) BP: (99-120)/(53-89) 99/53 (11/11 0841) SpO2:  [98 %-100 %] 98 % (11/11 0841) Weight:  [75.5 kg] 75.5 kg (11/11 0500) Last BM Date: 04/27/21 General:   Alert,   Well-developed, young white male, alert, speech slow but appropriate Head:  Normocephalic and atraumatic. Eyes:  Sclera clear, no icterus.   Conjunctiva pink. Ears:  Normal auditory acuity. Nose:  No deformity, discharge,  or lesions. Mouth:  No deformity or lesions.  Edentulous Neck:  Supple; no masses or thyromegaly. Lungs:  Clear throughout to auscultation.   No wheezes, crackles, or rhonchi. Heart:  Regular rate and rhythm; no murmurs, clicks, rubs,  or gallops. Abdomen:  Soft,nontender, BS active,nonpalp mass or hsm.   Rectal: Not done, documented heme negative this admission Msk:  Symmetrical without gross deformities. . Pulses:  Normal pulses noted. Extremities:  Without clubbing or edema. Neurologic:  Alert and  oriented x4;  . Skin:  Intact without significant lesions or rashes.. Psych:  Alert and cooperative.  Intake/Output from previous day: 11/10 0701 - 11/11 0700 In: 398.7 [P.O.:236; I.V.:62.7; IV Piggyback:100] Out: 2700 [Urine:2700] Intake/Output this shift: Total I/O In: 120 [P.O.:120] Out: 1200 [Urine:1200]  Lab Results: Recent Labs    04/28/21 0330 04/29/21 1221    WBC 7.2  --   HGB 7.7* 8.6*  HCT 26.0* 29.9*  PLT 254  --    BMET Recent Labs    04/28/21 0330  NA 137  K 3.8  CL 108  CO2 23  GLUCOSE 74  BUN <5*  CREATININE 0.71  CALCIUM 8.2*   LFT No results for input(s): PROT, ALBUMIN, AST, ALT, ALKPHOS, BILITOT, BILIDIR, IBILI in the last 72 hours. PT/INR No results for input(s): LABPROT, INR in the last 72 hours. Hepatitis Panel No results for input(s): HEPBSAG, HCVAB, HEPAIGM, HEPBIGM in the last 72 hours.    IMPRESSION:  #16 25 year old white male admitted with acute metabolic toxic encephalopathy, breakthrough seizure, and acute hypoxic respiratory event secondary to aspiration while in the ER on admission. #2 chronic polysubstance use, on multiple psychotropics and methadone #3 PTSD #4.  Anxiety/depression/possible bipolar  disorder #5.  History of seizure disorder #6 dysphagia-chronic-rule out esophageal stricture or dysmotility #7 weight loss 20+ pounds over the past 3 months likely multifactoral certainly in part secondary to dietary choices and dysphagia #8 malnutrition with hypoalbuminemia #9 chronic anemia previous documented iron deficiency anemia unclear etiology suspect malabsorption  PLAN: Keep n.p.o. after midnight Hold Lovenox Schedule for EGD with esophageal dilation if appropriate with Dr. Bryan Lemma for tomorrow 04/30/2021.  Procedure was discussed in detail with the patient including indications risks and benefits and he is agreeable to proceed  Iron studies TTG/IgA  Further work-up pending results of above.  On discharge patient can resume GI care with [place] PA-C 04/29/2021, 1:39 PM

## 2021-04-30 ENCOUNTER — Encounter (HOSPITAL_COMMUNITY)
Admission: EM | Disposition: A | Discharge status: Home / Self Care | Payer: Self-pay | Source: Home / Self Care | Attending: Internal Medicine

## 2021-04-30 ENCOUNTER — Inpatient Hospital Stay (HOSPITAL_COMMUNITY): Payer: Medicaid Other | Admitting: Anesthesiology

## 2021-04-30 ENCOUNTER — Encounter (HOSPITAL_COMMUNITY): Payer: Self-pay | Admitting: Internal Medicine

## 2021-04-30 DIAGNOSIS — K2970 Gastritis, unspecified, without bleeding: Secondary | ICD-10-CM

## 2021-04-30 DIAGNOSIS — K209 Esophagitis, unspecified without bleeding: Secondary | ICD-10-CM

## 2021-04-30 DIAGNOSIS — K222 Esophageal obstruction: Secondary | ICD-10-CM

## 2021-04-30 DIAGNOSIS — R1319 Other dysphagia: Secondary | ICD-10-CM

## 2021-04-30 HISTORY — PX: BIOPSY: SHX5522

## 2021-04-30 HISTORY — PX: BALLOON DILATION: SHX5330

## 2021-04-30 HISTORY — PX: ESOPHAGOGASTRODUODENOSCOPY (EGD) WITH PROPOFOL: SHX5813

## 2021-04-30 LAB — BASIC METABOLIC PANEL
Anion gap: 7 (ref 5–15)
BUN: 5 mg/dL — ABNORMAL LOW (ref 6–20)
CO2: 25 mmol/L (ref 22–32)
Calcium: 8.1 mg/dL — ABNORMAL LOW (ref 8.9–10.3)
Chloride: 104 mmol/L (ref 98–111)
Creatinine, Ser: 0.75 mg/dL (ref 0.61–1.24)
GFR, Estimated: >60 mL/min (ref >60)
Glucose, Bld: 102 mg/dL — ABNORMAL HIGH (ref 70–99)
Potassium: 3.9 mmol/L (ref 3.5–5.1)
Sodium: 136 mmol/L (ref 135–145)

## 2021-04-30 LAB — FERRITIN: Ferritin: 14 ng/mL — ABNORMAL LOW (ref 24–336)

## 2021-04-30 LAB — GLUCOSE, CAPILLARY
Glucose-Capillary: 109 mg/dL — ABNORMAL HIGH (ref 70–99)
Glucose-Capillary: 91 mg/dL (ref 70–99)
Glucose-Capillary: 106 mg/dL — ABNORMAL HIGH (ref 70–99)
Glucose-Capillary: 81 mg/dL (ref 70–99)
Glucose-Capillary: 94 mg/dL (ref 70–99)

## 2021-04-30 LAB — HEMOGLOBIN AND HEMATOCRIT, BLOOD
HCT: 32.5 % — ABNORMAL LOW (ref 39.0–52.0)
Hemoglobin: 9.5 g/dL — ABNORMAL LOW (ref 13.0–17.0)

## 2021-04-30 LAB — CBC
HCT: 25.3 % — ABNORMAL LOW (ref 39.0–52.0)
Hemoglobin: 7.4 g/dL — ABNORMAL LOW (ref 13.0–17.0)
MCH: 23.9 pg — ABNORMAL LOW (ref 26.0–34.0)
MCHC: 29.2 g/dL — ABNORMAL LOW (ref 30.0–36.0)
MCV: 81.6 fL (ref 80.0–100.0)
Platelets: 267 10*3/uL (ref 150–400)
RBC: 3.1 MIL/uL — ABNORMAL LOW (ref 4.22–5.81)
RDW: 18.6 % — ABNORMAL HIGH (ref 11.5–15.5)
WBC: 7 10*3/uL (ref 4.0–10.5)
nRBC: 0 % (ref 0.0–0.2)

## 2021-04-30 LAB — SURGICAL PCR SCREEN
MRSA, PCR: NEGATIVE
Staphylococcus aureus: NEGATIVE

## 2021-04-30 SURGERY — ESOPHAGOGASTRODUODENOSCOPY (EGD) WITH PROPOFOL
Anesthesia: Monitor Anesthesia Care

## 2021-04-30 MED ORDER — PROPOFOL 500 MG/50ML IV EMUL
INTRAVENOUS | Status: DC | PRN
  Administered 2021-04-30: 125 ug/kg/min via INTRAVENOUS

## 2021-04-30 MED ORDER — PANTOPRAZOLE SODIUM 40 MG PO TBEC
40.0000 mg | DELAYED_RELEASE_TABLET | Freq: Two times a day (BID) | ORAL | Status: DC
  Administered 2021-04-30 – 2021-05-04 (×9): 40 mg via ORAL
  Filled 2021-04-30 (×9): qty 1

## 2021-04-30 MED ORDER — SODIUM CHLORIDE 0.9 % IV SOLN: INTRAVENOUS | Status: DC | PRN

## 2021-04-30 MED ORDER — SODIUM CHLORIDE 0.9 % IV SOLN
250.0000 mg | Freq: Every day | INTRAVENOUS | Status: AC
  Administered 2021-04-30 – 2021-05-03 (×4): 250 mg via INTRAVENOUS
  Filled 2021-04-30: qty 20
  Filled 2021-04-30: qty 15
  Filled 2021-04-30 (×2): qty 20

## 2021-04-30 SURGICAL SUPPLY — 15 items

## 2021-04-30 NOTE — Progress Notes (Signed)
PROGRESS NOTE    Vincent Green  XJO:[id number] DOB: 1996-01-29 DOA: 04/24/2021 PCP: Leonie Douglas, MD   Chief Complaint  Patient presents with   Edema  Brief Narrative/Hospital Course:  Vincent Green, 25 y.o. male with PMH of PTSD, depression/anxiety, seizure disorder, microcytic anemia, and polysubstance use admitted for acute metabolic toxic encephalopathy with breakthrough seizure in setting of polysubstance use at [facility] ED where he was intubated for acute hypoxic respiratory failure initially with concerns of aspiration pneumonitis, transferred to [facility], ICU. Has been covered with vancomycin-switched to rocephin for this and also for bilateral lower extremity cellulitis he was extubated 11/8 to follow nasal cannula seen by neurology in the ED adjusted. Transferred to [facility] 11/9.  Patient is slow to respond but alert awake oriented x3.  Psychiatry service consulted suspecting possible bipolar, psych meds has been adjusted.  Patient is also on chronic methadone verified with his father and with methadone clinic by nursing staff 11/9 and placed back with plan to titrate to home dose of 90 mg It appears patient has been having poor appetite lost almost 20% weight past 2 months and following up with GI for possible esophageal dilatation. RD following and diet augmented. Has been having issues with hypoglycemia-needing D5 cortisol level adequate.  Liberalizing diet.  Remains deconditioned and weak and need inpatient rehab and waiting for placement. Patient continued to have GI symptoms with nausea vomiting -GI was consulted 11/11.  Subjective: Sleepy this am but woke up and followed commands Afebrile overnight.  Hb drifting Nursing holding meds due to his drowsiness as he is going for EGD today He has no complaints  Assessment & Plan:  Acute hypoxemic respiratory failure Acute toxic encephalopathy in the setting of polypharmacy Seizure disorder: Patient was intubated and  subsequently extubated 11/8.  Now on room air.  Mental status oriented x3 but somewhat slow to respond.  Psych meds adjusted by psychiatry input is appreciated.Continue with current AEDs as per neurology.  Continue PT OT and planning for CIR once bed available.  Minimize psychotropic medication if there is somnolence.  Aspiration pneumonitis: Completed antibiotics x5 days.  Doing well on room air and no leukocytosis.  Bilateral lower extremity cellulitis POA B/l Leg edema-appears chronic: Resolved.  Completed antibiotics.  Edema much better after Lasix.Edema appears in relation to his malnutrition low albumin level.TTE on 04/25/21:lvef 55-60%,No RWMA,his duplex on leg were negative in sept 2022.Checked duplex again and is neg for dvt.  Chronic back pain  Chronic methadone and lyrica Chronic pain:  Cont methadone at 50 mg and lyrica. Hold lyrica for sedation, concern abt somnolence with all these meds. He claims he got seizure after getting narcan in ED at [facility]. Reprots he sees neurology for his chronic pain and had MRI abt 3 months ago.  Slowly uptitrate his methadone as tolerated ( takes 90 mg)  Possible Polysubstance abuse hx- uds + for benzo, amphetamine and thc. Denies substrance abuse to me. he reports he does do marijuawana and other meds are prescribed. Minimize polypharmacy.  PTSD Anxiety Depression-concern for BPD: Appreciate psychiatry input- cont Valium 5 mg bid-May need to adjust dose if continues to have somnolence, appreciate psychiatry input were managing.  Continue Depakote 250 daily /750 nightly for seizure and concern for possible bipolar disorder.  Social worker referral for outpatient psychology and outpatient psych.  Father is involved.  Thyroid function is stable.  Acute on chronic microcytic anemia:hemoglobin 6 gm on admission baseline 9 g, received 2 units transfusion, hemoglobin is drifting may  need blood transfusion.  Pending GI work-up today.  Recent Labs  Lab  04/25/21 1953 04/26/21 0639 04/28/21 0330 04/29/21 1221 04/30/21 0221  HGB 7.7* 8.1* 7.7* 8.6* 7.4*  HCT 25.9* 27.5* 26.0* 29.9* 25.3*   Dysphagia/nausea vomiting/20% weight loss in past 2 months: Patient has been mostly eating Ramen at home has been having persistent nausea vomiting even with pills.  GI consulted going for EGD with esophageal dilation and biopsies.  Reglan PPI per gi  Hypoglycemia needing D10 while in ICU. Off D10,.  Continue on D5 NS monitor CBG.serum cortisol is adequate 14.5 ruling out Addison's disease. We will discontinue IV fluids  as tolerated. Recent Labs  Lab 04/29/21 1148 04/29/21 1710 04/29/21 1959 04/29/21 2305 04/30/21 0317  GLUCAP 95 115* 106* 89 109*     Painful penile lesion:GC/Chlamydia currently.  Has no complaints at this time.  Outpatient follow-up   Deconditioning/debility/generalized weakness continue PT OT-recommending inpatient rehab.   DVT prophylaxis: enoxaparin (LOVENOX) injection 40 mg Start: 05/01/21 2200 SCDs Start: 04/24/21 1831 Code Status:   Code Status: Full Code Family Communication: plan of care discussed with patient at bedside. Status is: Inpatient Remains inpatient appropriate because: Awaiting for CIR placement.  Remains hospitalized monitor blood sugar, ensure abel to take po.   Objective: Vitals last 24 hrs: Vitals:   04/29/21 2312 04/30/21 0319 04/30/21 0500 04/30/21 0819  BP: 128/80 113/70  104/68  Pulse: (!) 106 100  88  Resp: 20 18  14   Temp: 97.8 F (36.6 C) 98 F (36.7 C)  (!) 97.5 F (36.4 C)  TempSrc: Oral Oral  Oral  SpO2: 100% 97%  97%  Weight:   75.7 kg   Height:       Weight change: 0.2 kg  Intake/Output Summary (Last 24 hours) at 04/30/2021 0849 Last data filed at 04/30/2021 0700 Gross per 24 hour  Intake 120 ml  Output 1800 ml  Net -1680 ml    Net IO Since Admission: -257.29 mL [04/30/21 0849]   Physical Examination: General exam: Somnolent but able to wake up alert awake oriented  x3 interactive follows commands appropriately.   HEENT:Oral mucosa moist, Ear/Nose WNL grossly, dentition normal. Respiratory system: bilaterally clear, no use of accessory muscle Cardiovascular system: S1 & S2 +, No JVD,. Gastrointestinal system: Abdomen soft, NT,ND, BS+ Nervous System:Alert, awake, moving extremities and grossly nonfocal Extremities: No edema, distal peripheral pulses palpable.  Skin: No rashes,no icterus. MSK: Normal muscle bulk,tone, power   Medications reviewed:  Scheduled Meds:  sodium chloride   Intravenous Once   chlorhexidine gluconate (MEDLINE KIT)  15 mL Mouth Rinse BID   diazepam  5 mg Oral BID   [START ON 05/01/2021] enoxaparin (LOVENOX) injection  40 mg Subcutaneous QHS   feeding supplement  237 mL Oral TID BM   ferrous sulfate  325 mg Oral Q breakfast   folic acid  1 mg Oral Daily   mouth rinse  15 mL Mouth Rinse 10 times per day   methadone  50 mg Oral Daily   multivitamin with minerals  1 tablet Oral Daily   nicotine  14 mg Transdermal Daily   pantoprazole (PROTONIX) IV  40 mg Intravenous Q24H   perampanel  4 mg Oral QHS   prazosin  1 mg Oral QHS   pregabalin  200 mg Oral TID   thiamine  100 mg Oral Daily   valproic acid  250 mg Oral QAC breakfast   valproic acid  750 mg Oral Q2000  Continuous Infusions:  dextrose 5 % and 0.9% NaCl 50 mL/hr at 04/30/21 5997    Diet Order             Diet NPO time specified  Diet effective midnight                   Nutrition Problem: Moderate Malnutrition Etiology: chronic illness (COPD, polysubstance abuse, PTSD?) Signs/Symptoms: mild fat depletion, mild muscle depletion, moderate fat depletion, moderate muscle depletion, percent weight loss Percent weight loss: 20 % (within 2 months) Interventions: Ensure Enlive (each supplement provides 350kcal and 20 grams of protein), MVI, Refer to RD note for recommendations  Weight change: 0.2 kg  Wt Readings from Last 3 Encounters:  04/30/21 75.7 kg   03/01/21 95 kg  02/05/21 95.3 kg     Consultants:see note  Procedures:see note Antimicrobials: Anti-infectives (From admission, onward)    Start     Dose/Rate Route Frequency Ordered Stop   04/27/21 1000  cefTRIAXone (ROCEPHIN) 2 g in sodium chloride 0.9 % 100 mL IVPB        2 g 200 mL/hr over 30 Minutes Intravenous Every 24 hours 04/26/21 1244 04/29/21 1143   04/25/21 1530  cefTRIAXone (ROCEPHIN) 1 g in sodium chloride 0.9 % 100 mL IVPB  Status:  Discontinued        1 g 200 mL/hr over 30 Minutes Intravenous Every 24 hours 04/25/21 1436 04/26/21 1244   04/25/21 0100  vancomycin (VANCOREADY) IVPB 1500 mg/300 mL  Status:  Discontinued        1,500 mg 150 mL/hr over 120 Minutes Intravenous Every 12 hours 04/24/21 1509 04/25/21 1436   04/24/21 2300  cefTRIAXone (ROCEPHIN) 1 g in sodium chloride 0.9 % 100 mL IVPB  Status:  Discontinued        1 g 200 mL/hr over 30 Minutes Intravenous Every 24 hours 04/24/21 2209 04/24/21 2211   04/24/21 1500  Ampicillin-Sulbactam (UNASYN) 3 g in sodium chloride 0.9 % 100 mL IVPB  Status:  Discontinued        3 g 200 mL/hr over 30 Minutes Intravenous  Once 04/24/21 1445 04/24/21 1452   04/24/21 1500  ceFEPIme (MAXIPIME) 1 g in sodium chloride 0.9 % 100 mL IVPB  Status:  Discontinued        1 g 200 mL/hr over 30 Minutes Intravenous  Once 04/24/21 1453 04/24/21 1918   04/24/21 1500  azithromycin (ZITHROMAX) 500 mg in sodium chloride 0.9 % 250 mL IVPB        500 mg 250 mL/hr over 60 Minutes Intravenous  Once 04/24/21 1453 04/24/21 1808   04/24/21 1500  vancomycin (VANCOREADY) IVPB 1000 mg/200 mL        1,000 mg 200 mL/hr over 60 Minutes Intravenous  Once 04/24/21 1456 04/24/21 2109      Culture/Microbiology    Component Value Date/Time   SDES TRACHEAL ASPIRATE 04/24/2021 1919   SPECREQUEST NONE 04/24/2021 1919   CULT  04/24/2021 1919    RARE Normal respiratory flora-no Staph aureus or Pseudomonas seen Performed at [facility], [place]  [address], [place], [place] [address]    REPTSTATUS 04/27/2021 FINAL 04/24/2021 1919    Other culture-see note  Unresulted Labs (From admission, onward)     Start     Ordered   04/30/21 3953  Basic metabolic panel  Daily,   R     Question:  Specimen collection method  Answer:  Lab=Lab collect   04/29/21 1146   04/30/21  0500  CBC  Daily,   R     Question:  Specimen collection method  Answer:  Lab=Lab collect   04/29/21 1146   04/25/21 2000  Drug Screen 10 W/Conf, Serum  Once,   R        04/25/21 2000          Data Reviewed: I have personally reviewed following labs and imaging studies CBC: Recent Labs  Lab 04/24/21 1156 04/24/21 2015 04/24/21 2257 04/25/21 0429 04/25/21 0442 04/25/21 1953 04/26/21 0639 04/28/21 0330 04/29/21 1221 04/30/21 0221  WBC 6.3  --  13.0* 9.7  --   --  7.7 7.2  --  7.0  NEUTROABS 5.0  --   --   --   --   --   --   --   --   --   HGB 6.0*   < > 8.1* 7.1*   < > 7.7* 8.1* 7.7* 8.6* 7.4*  HCT 22.2*   < > 27.3* 23.7*   < > 25.9* 27.5* 26.0* 29.9* 25.3*  MCV 82.8  --  81.0 80.6  --   --  81.6 81.5  --  81.6  PLT 273  --  263 253  --   --  256 254  --  267   < > = values in this interval not displayed.    Basic Metabolic Panel: Recent Labs  Lab 04/24/21 1156 04/24/21 1949 04/24/21 2015 04/25/21 0429 04/25/21 0442 04/26/21 0639 04/28/21 0330 04/30/21 0221  NA 137  --    < > 137 139 135 137 136  K 3.6  --    < > 3.4* 3.3* 4.2 3.8 3.9  CL 104  --   --  110  --  107 108 104  CO2 26  --   --  22  --  22 23 25   GLUCOSE 104*  --   --  99  --  95 74 102*  BUN 12  --   --  8  --  6 <5* 5*  CREATININE 0.91  --   --  0.79  --  0.79 0.71 0.75  CALCIUM 8.6*  --   --  7.5*  --  8.0* 8.2* 8.1*  MG  --  1.6*  --  1.9  --  1.8  --   --   PHOS  --  2.3*  --  3.0  --  2.9  --   --    < > = values in this interval not displayed.    GFR: Estimated Creatinine Clearance: 144.5 mL/min (by C-G formula based on SCr of 0.75 mg/dL). Liver Function Tests: Recent  Labs  Lab 04/24/21 1156 04/26/21 0639  AST 44* 20  ALT 21 19  ALKPHOS 218* 130*  BILITOT 0.2* 0.5  PROT 5.8* 4.2*  ALBUMIN 2.5* 1.7*    Recent Labs  Lab 04/25/21 1419  LIPASE 23    No results for input(s): AMMONIA in the last 168 hours. Coagulation Profile: No results for input(s): INR, PROTIME in the last 168 hours. Cardiac Enzymes: No results for input(s): CKTOTAL, CKMB, CKMBINDEX, TROPONINI in the last 168 hours. BNP (last 3 results) No results for input(s): PROBNP in the last 8760 hours. HbA1C: No results for input(s): HGBA1C in the last 72 hours. CBG: Recent Labs  Lab 04/29/21 1148 04/29/21 1710 04/29/21 1959 04/29/21 2305 04/30/21 0317  GLUCAP 95 115* 106* 89 109*    Lipid Profile: No results for input(s): CHOL, HDL,  LDLCALC, TRIG, CHOLHDL, LDLDIRECT in the last 72 hours. Thyroid Function Tests: No results for input(s): TSH, T4TOTAL, FREET4, T3FREE, THYROIDAB in the last 72 hours.  Anemia Panel: Recent Labs    04/29/21 1431 04/30/21 0221  FERRITIN  --  14*  IRON 23*  --     Sepsis Labs: No results for input(s): PROCALCITON, LATICACIDVEN in the last 168 hours.  Recent Results (from the past 240 hour(s))  Culture, blood (single)     Status: None   Collection Time: 04/24/21  3:26 PM   Specimen: BLOOD RIGHT HAND  Result Value Ref Range Status   Specimen Description BLOOD RIGHT HAND  Final   Special Requests   Final    BOTTLES DRAWN AEROBIC ONLY Blood Culture adequate volume   Culture   Final    NO GROWTH 5 DAYS Performed at [facility], [address], [place], [place] [address]    Report Status 04/29/2021 FINAL  Final  Blood culture (routine x 2)     Status: None   Collection Time: 04/24/21  3:26 PM   Specimen: BLOOD  Result Value Ref Range Status   Specimen Description BLOOD  Final   Special Requests NONE  Final   Culture   Final    NO GROWTH 5 DAYS Performed at [facility], [address] [place], [place] [address]    Report Status  04/29/2021 FINAL  Final  Blood culture (routine x 2)     Status: None   Collection Time: 04/24/21  3:26 PM   Specimen: BLOOD  Result Value Ref Range Status   Specimen Description BLOOD  Final   Special Requests NONE  Final   Culture   Final    NO GROWTH 5 DAYS Performed at [facility], [address] [place], [place] [address]    Report Status 04/29/2021 FINAL  Final  MRSA Next Gen by PCR, Nasal     Status: None   Collection Time: 04/24/21  7:18 PM   Specimen: Nasal Mucosa; Nasal Swab  Result Value Ref Range Status   MRSA by PCR Next Gen NOT DETECTED NOT DETECTED Final    Comment: (NOTE) The GeneXpert MRSA Assay (FDA approved for NASAL specimens only), is one component of a comprehensive MRSA colonization surveillance program. It is not intended to diagnose MRSA infection nor to guide or monitor treatment for MRSA infections. Test performance is not FDA approved in patients less than 5 years old. Performed at [facility], [place] [address] [place], [place] [address]   Culture, Respiratory w Gram Stain     Status: None   Collection Time: 04/24/21  7:19 PM   Specimen: Tracheal Aspirate; Respiratory  Result Value Ref Range Status   Specimen Description TRACHEAL ASPIRATE  Final   Special Requests NONE  Final   Gram Stain   Final    RARE SQUAMOUS EPITHELIAL CELLS PRESENT ABUNDANT WBC PRESENT, PREDOMINANTLY PMN RARE GRAM POSITIVE COCCI    Culture   Final    RARE Normal respiratory flora-no Staph aureus or Pseudomonas seen Performed at [facility], [address] [place],  [address]    Report Status 04/27/2021 FINAL  Final  Resp Panel by RT-PCR (Flu A&B, Covid) Nasopharyngeal Swab     Status: None   Collection Time: 04/27/21  1:17 AM   Specimen: Nasopharyngeal Swab; Nasopharyngeal(NP) swabs in vial transport medium  Result Value Ref Range Status   SARS Coronavirus 2 by RT PCR NEGATIVE NEGATIVE Final  Comment: (NOTE) SARS-CoV-2 target nucleic acids are  NOT DETECTED.  The SARS-CoV-2 RNA is generally detectable in upper respiratory specimens during the acute phase of infection. The lowest concentration of SARS-CoV-2 viral copies this assay can detect is 138 copies/mL. A negative result does not preclude SARS-Cov-2 infection and should not be used as the sole basis for treatment or other patient management decisions. A negative result may occur with  improper specimen collection/handling, submission of specimen other than nasopharyngeal swab, presence of viral mutation(s) within the areas targeted by this assay, and inadequate number of viral copies(<138 copies/mL). A negative result must be combined with clinical observations, patient history, and epidemiological information. The expected result is Negative.  Fact Sheet for Patients:  EntrepreneurPulse.com.au  Fact Sheet for Healthcare Providers:  IncredibleEmployment.be  This test is no t yet approved or cleared by the [place] FDA and  has been authorized for detection and/or diagnosis of SARS-CoV-2 by FDA under an Emergency Use Authorization (EUA). This EUA will remain  in effect (meaning this test can be used) for the duration of the COVID-19 declaration under Section 564(b)(1) of the Act, 21 U.S.C.section 360bbb-3(b)(1), unless the authorization is terminated  or revoked sooner.       Influenza A by PCR NEGATIVE NEGATIVE Final   Influenza B by PCR NEGATIVE NEGATIVE Final    Comment: (NOTE) The Xpert Xpress SARS-CoV-2/FLU/RSV plus assay is intended as an aid in the diagnosis of influenza from Nasopharyngeal swab specimens and should not be used as a sole basis for treatment. Nasal washings and aspirates are unacceptable for Xpert Xpress SARS-CoV-2/FLU/RSV testing.  Fact Sheet for Patients: EntrepreneurPulse.com.au  Fact Sheet for Healthcare Providers: IncredibleEmployment.be  This test is not  yet approved or cleared by the [place] FDA and has been authorized for detection and/or diagnosis of SARS-CoV-2 by FDA under an Emergency Use Authorization (EUA). This EUA will remain in effect (meaning this test can be used) for the duration of the COVID-19 declaration under Section 564(b)(1) of the Act, 21 U.S.C. section 360bbb-3(b)(1), unless the authorization is terminated or revoked.  Performed at [facility], [place] [address], [place], [place] [address]   Surgical pcr screen     Status: None   Collection Time: 04/29/21 10:26 PM   Specimen: Nasal Mucosa; Nasal Swab  Result Value Ref Range Status   MRSA, PCR NEGATIVE NEGATIVE Final   Staphylococcus aureus NEGATIVE NEGATIVE Final    Comment: (NOTE) The Xpert SA Assay (FDA approved for NASAL specimens in patients 6 years of age and older), is one component of a comprehensive surveillance program. It is not intended to diagnose infection nor to guide or monitor treatment. Performed at [facility], [place] [address], [place], [place] [address]       Radiology Studies: No results found.   LOS: 6 days   [name], MD [facility]  04/30/2021, 8:49 AM

## 2021-04-30 NOTE — Anesthesia Procedure Notes (Signed)
Procedure Name: MAC Date/Time: 04/30/2021 12:04 PM Performed by: Kyung Rudd, CRNA Pre-anesthesia Checklist: Patient identified, Emergency Drugs available and Suction available Patient Re-evaluated:Patient Re-evaluated prior to induction Oxygen Delivery Method: Nasal cannula Preoxygenation: Pre-oxygenation with 100% oxygen Induction Type: IV induction Placement Confirmation: positive ETCO2 Dental Injury: Teeth and Oropharynx as per pre-operative assessment

## 2021-04-30 NOTE — Transfer of Care (Signed)
Immediate Anesthesia Transfer of Care Note  Patient: [name]  Procedure(s) Performed: ESOPHAGOGASTRODUODENOSCOPY (EGD) WITH PROPOFOL BALLOON DILATION BIOPSY  Patient Location: PACU  Anesthesia Type:MAC  Level of Consciousness: awake, alert  and oriented  Airway & Oxygen Therapy: Patient Spontanous Breathing  Post-op Assessment: Report given to RN, Post -op Vital signs reviewed and stable and Patient moving all extremities X 4  Post vital signs: Reviewed and stable  Last Vitals:  Vitals Value Taken Time  BP 113/69 04/30/21 1236  Temp 36.7 C 04/30/21 1236  Pulse 86 04/30/21 1241  Resp 16 04/30/21 1241  SpO2 98 % 04/30/21 1241  Vitals shown include unvalidated device data.  Last Pain:  Vitals:   04/30/21 1236  TempSrc: Tympanic  PainSc: 0-No pain      Patients Stated Pain Goal: 3 (00/63/49 4944)  Complications: No notable events documented.

## 2021-04-30 NOTE — Anesthesia Preprocedure Evaluation (Signed)
Anesthesia Evaluation  Patient identified by MRN, date of birth, ID band Patient awake    Reviewed: Allergy & Precautions, NPO status , Patient's Chart, lab work & pertinent test results  History of Anesthesia Complications Negative for: history of anesthetic complications  Airway Mallampati: III  TM Distance: >3 FB Neck ROM: Full    Dental  (+) Dental Advisory Given   Pulmonary Current Smoker,    breath sounds clear to auscultation       Cardiovascular  Rhythm:Regular  1. Left ventricular ejection fraction, by estimation, is 55 to 60%. The  left ventricle has normal function. The left ventricle has no regional  wall motion abnormalities. Left ventricular diastolic parameters were  normal.  2. Right ventricular systolic function is normal. The right ventricular  size is normal. There is normal pulmonary artery systolic pressure.  3. The mitral valve is normal in structure. Trivial mitral valve  regurgitation. No evidence of mitral stenosis.  4. The aortic valve is tricuspid. Aortic valve regurgitation is not  visualized. No aortic stenosis is present.  5. The inferior vena cava is normal in size with greater than 50%  respiratory variability, suggesting right atrial pressure of 3 mmHg.    Neuro/Psych Seizures -,  PSYCHIATRIC DISORDERS Anxiety    GI/Hepatic GERD  ,(+)     substance abuse  ,   Endo/Other  negative endocrine ROS  Renal/GU Lab Results      Component                Value               Date                      CREATININE               0.75                04/30/2021                Musculoskeletal   Abdominal   Peds  Hematology  (+) Blood dyscrasia, anemia , Lab Results      Component                Value               Date                      WBC                      7.0                 04/30/2021                HGB                      7.4 (L)             04/30/2021                HCT                       25.3 (L)            04/30/2021                MCV  81.6                04/30/2021                PLT                      267                 04/30/2021              Anesthesia Other Findings   Reproductive/Obstetrics                             Anesthesia Physical Anesthesia Plan  ASA: 3  Anesthesia Plan: MAC   Post-op Pain Management:    Induction: Intravenous  PONV Risk Score and Plan: 0 and Propofol infusion and Treatment may vary due to age or medical condition  Airway Management Planned: Nasal Cannula  Additional Equipment: None  Intra-op Plan:   Post-operative Plan:   Informed Consent: I have reviewed the patients History and Physical, chart, labs and discussed the procedure including the risks, benefits and alternatives for the proposed anesthesia with the patient or authorized representative who has indicated his/her understanding and acceptance.     Dental advisory given  Plan Discussed with: CRNA and Anesthesiologist  Anesthesia Plan Comments:         Anesthesia Quick Evaluation

## 2021-04-30 NOTE — Anesthesia Postprocedure Evaluation (Signed)
Anesthesia Post Note  Patient: Vincent Green  Procedure(s) Performed: ESOPHAGOGASTRODUODENOSCOPY (EGD) WITH PROPOFOL BALLOON DILATION BIOPSY     Patient location during evaluation: Endoscopy Anesthesia Type: MAC Level of consciousness: awake and alert Pain management: pain level controlled Vital Signs Assessment: post-procedure vital signs reviewed and stable Respiratory status: spontaneous breathing, nonlabored ventilation, respiratory function stable and patient connected to nasal cannula oxygen Cardiovascular status: stable and blood pressure returned to baseline Postop Assessment: no apparent nausea or vomiting Anesthetic complications: no   No notable events documented.  Last Vitals:  Vitals:   04/30/21 1246 04/30/21 1522  BP: 123/78 117/62  Pulse: 85 98  Resp: 15 14  Temp:  36.8 C  SpO2: 100% 100%    Last Pain:  Vitals:   04/30/21 1522  TempSrc: Oral  PainSc:                  [name]

## 2021-04-30 NOTE — Plan of Care (Signed)

## 2021-04-30 NOTE — Op Note (Signed)
[facility] Patient Name: Vincent Green Procedure Date : 04/30/2021 MRN: [id number] Attending MD: Doristine Locks , MD Date of Birth: 12-Jan-1996 CSN: [id number] Age: 25 Admit Type: Inpatient Procedure:                Upper GI endoscopy Indications:              Iron deficiency anemia, Dysphagia Providers:                Doristine Locks, MD, Janae Sauce. Steele Berg, RN, Kandice Robinsons, Technician Referring MD:              Medicines:                Monitored Anesthesia Care Complications:            No immediate complications. Estimated Blood Loss:     Estimated blood loss was minimal. Procedure:                Pre-Anesthesia Assessment:                           - Prior to the procedure, a History and Physical                            was performed, and patient medications and                            allergies were reviewed. The patient's tolerance of                            previous anesthesia was also reviewed. The risks                            and benefits of the procedure and the sedation                            options and risks were discussed with the patient.                            All questions were answered, and informed consent                            was obtained. Prior Anticoagulants: The patient has                            taken no previous anticoagulant or antiplatelet                            agents. ASA Grade Assessment: III - A patient with                            severe systemic disease. After reviewing the risks  and benefits, the patient was deemed in                            satisfactory condition to undergo the procedure.                           After obtaining informed consent, the endoscope was                            passed under direct vision. Throughout the                            procedure, the patient's blood pressure, pulse, and                             oxygen saturations were monitored continuously. The                            GIF-H190 ([id number]) Olympus endoscope was introduced                            through the mouth, and advanced to the second part                            of duodenum. The upper GI endoscopy was                            accomplished without difficulty. The patient                            tolerated the procedure well. Findings:      One severe stenosis was found 34 cm from the incisors. This stenosis       measured 6 mm (inner diameter) x 1 cm (in length). The stenosis was       traversed after dilation. A TTS dilator was passed through the scope.       Dilation with a 6-7-8 mm balloon without mucosal rent and unabel to       advance endoscoe. This was then dilated further with an 01-25-09 mm       balloon dilator was performed to maximum 10 mm. The dilation site was       examined and showed mild mucosal disruption and moderate improvement in       luminal narrowing. Estimated blood loss was minimal. The endoscope could       then traverse the area.      LA Grade D (one or more mucosal breaks involving at least 75% of       esophageal circumference) esophagitis with no bleeding was found in the       lower and middle third of the esophagus. Biopsies were taken with a cold       forceps for histology. Estimated blood loss was minimal.      Mild inflammation characterized by congestion (edema) and erythema was       found in the gastric body and in the gastric antrum. There was mild       luminal deformity of the prepyloric  antrum, but no ulcers noted on this       study. Biopsies were taken with a cold forceps for Helicobacter pylori       testing. Estimated blood loss was minimal.      Localized moderately erythematous mucosa without active bleeding was       found in the duodenal bulb.      The second portion of the duodenum was normal. Biopsies for histology       were taken with a cold forceps for  evaluation of celiac disease.       Estimated blood loss was minimal. Impression:               - Esophageal stenosis. Dilated.                           - LA Grade D esophagitis with no bleeding. Biopsied.                           - Gastritis. Biopsied.                           - Erythematous duodenopathy.                           - Normal second portion of the duodenum. Biopsied. Recommendation:           - Return patient to hospital ward for ongoing care.                           - Full liquid diet today then slowly advance as                            tolerated to soft foods tomorrow. Do not advance                            beyond soft foods until follow-up with his                            outpatient GI.                           - Use Protonix (pantoprazole) 40 mg PO BID for 8                            weeks, then reduce to 40 mg daily.                           - Await pathology results.                           - Repeat upper endoscopy in 4 weeks for retreatment.                           - Follow-up with [facility] at appointment to be  scheduled.                           - Will give IV iron today.                           - Repeat CBC 7-10 days after hospital discharge,                            then CBC and iron panel 2 months after discharge.                           - Start multivitamin.                           - Please do not hesitate to contact the inpatient                            GI team with additional questions or concerns. Procedure Code(s):        --- Professional ---                           [phone], Esophagogastroduodenoscopy, flexible,                            transoral; with transendoscopic balloon dilation of                            esophagus (less than 30 mm diameter)                           43239, 59, Esophagogastroduodenoscopy, flexible,                            transoral; with biopsy, single or  multiple Diagnosis Code(s):        --- Professional ---                           K22.2, Esophageal obstruction                           K20.90, Esophagitis, unspecified without bleeding                           K29.70, Gastritis, unspecified, without bleeding                           K31.89, Other diseases of stomach and duodenum                           D50.9, Iron deficiency anemia, unspecified                           R13.10, Dysphagia, unspecified CPT copyright 2019 [employer]. All rights reserved. The codes documented in this report are preliminary and upon coder review may  be revised to meet current compliance requirements. [employer],  MD 04/30/2021 12:37:38 PM Number of Addenda: 0

## 2021-04-30 NOTE — Interval H&P Note (Signed)
History and Physical Interval Note:  04/30/2021 11:36 AM  Vincent Green  has presented today for surgery, with the diagnosis of dysphagia.  The various methods of treatment have been discussed with the patient and family. After consideration of risks, benefits and other options for treatment, the patient has consented to  Procedure(s): ESOPHAGOGASTRODUODENOSCOPY (EGD) WITH PROPOFOL (N/A) as a surgical intervention.  The patient's history has been reviewed, patient examined, no change in status, stable for surgery.  I have reviewed the patient's chart and labs.  Questions were answered to the patient's satisfaction.     Verlin Dike Romney Compean

## 2021-05-01 ENCOUNTER — Inpatient Hospital Stay (HOSPITAL_COMMUNITY): Payer: Medicaid Other

## 2021-05-01 DIAGNOSIS — K2970 Gastritis, unspecified, without bleeding: Secondary | ICD-10-CM

## 2021-05-01 DIAGNOSIS — R1319 Other dysphagia: Secondary | ICD-10-CM

## 2021-05-01 DIAGNOSIS — K209 Esophagitis, unspecified without bleeding: Secondary | ICD-10-CM

## 2021-05-01 DIAGNOSIS — R092 Respiratory arrest: Secondary | ICD-10-CM

## 2021-05-01 DIAGNOSIS — K2990 Gastroduodenitis, unspecified, without bleeding: Secondary | ICD-10-CM

## 2021-05-01 DIAGNOSIS — K222 Esophageal obstruction: Secondary | ICD-10-CM

## 2021-05-01 LAB — CBC
HCT: 27.7 % — ABNORMAL LOW (ref 39.0–52.0)
Hemoglobin: 8 g/dL — ABNORMAL LOW (ref 13.0–17.0)
MCH: 23.7 pg — ABNORMAL LOW (ref 26.0–34.0)
MCHC: 28.9 g/dL — ABNORMAL LOW (ref 30.0–36.0)
MCV: 82 fL (ref 80.0–100.0)
Platelets: 269 10*3/uL (ref 150–400)
RBC: 3.38 MIL/uL — ABNORMAL LOW (ref 4.22–5.81)
RDW: 18.1 % — ABNORMAL HIGH (ref 11.5–15.5)
WBC: 6.2 10*3/uL (ref 4.0–10.5)
nRBC: 0 % (ref 0.0–0.2)

## 2021-05-01 LAB — GLUCOSE, CAPILLARY
Glucose-Capillary: 87 mg/dL (ref 70–99)
Glucose-Capillary: 123 mg/dL — ABNORMAL HIGH (ref 70–99)
Glucose-Capillary: 83 mg/dL (ref 70–99)
Glucose-Capillary: 80 mg/dL (ref 70–99)
Glucose-Capillary: 67 mg/dL — ABNORMAL LOW (ref 70–99)
Glucose-Capillary: 74 mg/dL (ref 70–99)
Glucose-Capillary: 70 mg/dL (ref 70–99)

## 2021-05-01 LAB — BASIC METABOLIC PANEL
Anion gap: 6 (ref 5–15)
BUN: 5 mg/dL — ABNORMAL LOW (ref 6–20)
CO2: 27 mmol/L (ref 22–32)
Calcium: 8.4 mg/dL — ABNORMAL LOW (ref 8.9–10.3)
Chloride: 106 mmol/L (ref 98–111)
Creatinine, Ser: 0.77 mg/dL (ref 0.61–1.24)
GFR, Estimated: >60 mL/min (ref >60)
Glucose, Bld: 101 mg/dL — ABNORMAL HIGH (ref 70–99)
Potassium: 4.2 mmol/L (ref 3.5–5.1)
Sodium: 139 mmol/L (ref 135–145)

## 2021-05-01 MED ORDER — METOCLOPRAMIDE HCL 5 MG/ML IJ SOLN
10.0000 mg | Freq: Four times a day (QID) | INTRAMUSCULAR | Status: DC | PRN
  Administered 2021-05-01 – 2021-05-03 (×2): 10 mg via INTRAVENOUS
  Filled 2021-05-01 (×2): qty 2

## 2021-05-01 MED ORDER — ONDANSETRON HCL 4 MG/2ML IJ SOLN
4.0000 mg | Freq: Four times a day (QID) | INTRAMUSCULAR | Status: DC | PRN
  Administered 2021-05-01: 4 mg via INTRAVENOUS
  Filled 2021-05-01: qty 2

## 2021-05-01 NOTE — Plan of Care (Signed)

## 2021-05-01 NOTE — Significant Event (Addendum)
Rapid Response Event Note   Reason for Call :  Patient fell  Initial Focused Assessment:  I was called by the charge RN because this patient had an unwitnessed fall. The patient is not currently on any blood thinners. The patient states that he was reaching for his urinal that had fallen to the floor. He stated that he thought he could reach it but wasn't able to get it. He fell and states that he hit the back of his head. All the necessary safety precautions were in place eg bed alarm and call bell.   The patient appears to be okay after his fall. He complains of 10/10 pain at the back of his head where he hit his head. Full neuro exam performed on this patient, which was WNL. See flow sheet for documentation of neuro exam.  Vital signs WNL  His mental status is good. He is awake and on the telephone when I visited him. He is oriented to person, place, time. His motor status is normal. He has equal bilateral grip strength. Left lower extremity strength is 3/5. He reports tingling in left foot. He wiggles his toes appropriately. Bilateral upper extremity movement without vertical drift. He has normal sensation on all extremities. No ataxia noted on exam.      Interventions:  Neuro exam performed  Plan of Care:  Patient will get head CT   Event Summary:   MD Notified: Dr. Tyson Babinski Call Time: 1427 Arrival Time: 1729 End Time:1500  Andrey Spearman, RN

## 2021-05-01 NOTE — Progress Notes (Signed)
PROGRESS NOTE    Vincent Green  VZC:[id number] DOB: 04/25/1996 DOA: 04/24/2021 PCP: Vincent Douglas, MD   Brief Narrative/Hospital Course:  Vincent Green, 25 y.o. male with past medical history of posttraumatic stress disorder, depression/anxiety, seizure disorder, microcytic anemia, polysubstance abuse presented to the hospital with breakthrough seizures.  Patient was intubated for concerns of aspiration pneumonia from seizures.  Patient was extubated on 04/26/2021.  Patient was also seen by psychiatry for possible bipolar disorder.  Medications have been adjusted.  Patient is on chronic methadone as per the patient's father from outpatient but had been losing weight for several months.  GI was consulted on 04/29/2021 for nausea vomiting.  Assessment & Plan:  Acute hypoxemic respiratory failure with toxic encephalopathy in the setting of polypharmacy.   Patient was intubated and subsequently extubated on 04/26/2021.  Currently on room air.  Patient was seen by psychiatry and medications have been adjusted.  Seizure disorder: Patient was intubated and subsequently extubated 04/26/21.  At this time, mentation has improved.  Continue AED.  Continue PT OT.  Try to minimize psychotropic medications if there is somnolence.    Aspiration pneumonitis:  Completed antibiotics for 5 days.    Bilateral lower extremity cellulitis with B/l Leg edema- Patient has completed antibiotic.  Transthoracic echocardiogram on 04/25/2021 showed LV ejection fraction of 55 to 60%.  Lower extremity ultrasound negative for DVT.  Chronic back pain, on methadone and Lyrica as outpatient. Plan is to uptitrate his methadone as tolerated to 90 mg daily.     Polysubstance abuse urine drug screen was positive for benzo, amphetamine and THC.   PTSD/Anxiety/Depression-concern for bipolar disorder. Patient is currently on Valium 5 mg twice daily.  Continue Depakote and psychiatry input.  Continue Valium.   Social worker  referral for outpatient psychology and outpatient psych.  Father is involved.    Acute on chronic microcytic anemia: Patient presented with hemoglobin of 6 on presentation.  Status post 2 units of packed RBC.  Patient underwent endoscopic evaluation with esophageal dilation.  We will continue to monitor closely.  Hemoglobin of 8.0.    Dysphagia/nausea vomiting/20% weight loss in past 2 months:  Was mostly eating noodles at home.  GI was consulted for vomiting, nausea and underwent esophageal dilation and biopsy.  Continue Reglan, PPI.  Hypoglycemia  Encouraged oral intake.  Was on D10 initially.  We will continue to monitor.   Painful penile lesion: Will need outpatient follow-up.  Deconditioning/debility/generalized weakness  Physical therapy has recommended CIR.  DVT prophylaxis: enoxaparin (LOVENOX) injection 40 mg Start: 05/01/21 2200 SCDs Start: 04/24/21 1831    Code Status: Full Code  Family Communication:  Spoke with the patient at bedside.    Status is: Inpatient  Remains inpatient appropriate because: Awaiting for CIR placement.    Subjective:  Today, patient was seen and examined at bedside.  Patient continues to feel nauseated.  Patient request for pain medication   Objective:  Vitals:   05/01/21 0351 05/01/21 0500 05/01/21 0845 05/01/21 1230  BP: 112/83  101/75 127/86  Pulse: (!) 102  95 100  Resp: _0 Temp: 98.1 F (36.7 C)  98.4 F (36.9 C) 97.8 F (36.6 C)  TempSrc: Oral  Oral Oral  SpO2: 99%  99% 100%  Weight:  74.5 kg    Height:       Weight change: 0 kg  Intake/Output Summary (Last 24 hours) at 05/01/2021 1317 Last data filed at 04/30/2021 2036 Gross per 24 hour  Intake  1341.73 ml  Output --  Net 1341.73 ml    Net IO Since Admission: 1,384.44 mL [05/01/21 1317]   Physical Examination: General:  Average built, not in obvious distress, alert awake communicative, mildly somnolent HENT:   No scleral pallor or icterus noted. Oral mucosa  is moist.  Chest:  Clear breath sounds.  Diminished breath sounds bilaterally. No crackles or wheezes.  CVS: S1 &S2 heard. No murmur.  Regular rate and rhythm. Abdomen: Soft, nontender, nondistended.  Bowel sounds are heard.   Extremities: No cyanosis, clubbing or edema.  Peripheral pulses are palpable. Psych: Alert, awake and mildly sleepy but oriented when on conversation, normal mood CNS:  No cranial nerve deficits.  Power equal in all extremities.   Skin: Warm and dry.  No rashes noted.   Medications reviewed:  Scheduled Meds:  sodium chloride   Intravenous Once   chlorhexidine gluconate (MEDLINE KIT)  15 mL Mouth Rinse BID   diazepam  5 mg Oral BID   enoxaparin (LOVENOX) injection  40 mg Subcutaneous QHS   feeding supplement  237 mL Oral TID BM   ferrous sulfate  325 mg Oral Q breakfast   folic acid  1 mg Oral Daily   mouth rinse  15 mL Mouth Rinse 10 times per day   methadone  50 mg Oral Daily   multivitamin with minerals  1 tablet Oral Daily   nicotine  14 mg Transdermal Daily   pantoprazole  40 mg Oral BID   perampanel  4 mg Oral QHS   prazosin  1 mg Oral QHS   pregabalin  200 mg Oral TID   thiamine  100 mg Oral Daily   valproic acid  250 mg Oral QAC breakfast   valproic acid  750 mg Oral Q2000   Continuous Infusions:  dextrose 5 % and 0.9% NaCl 50 mL/hr at 04/30/21 0615   ferric gluconate (FERRLECIT) IVPB 250 mg (05/01/21 1123)   Moderate protein calorie malnutrition.  Continue nutritional supplements. Continue nutritional supplements.  Nutrition Problem: Moderate Malnutrition Etiology: chronic illness (COPD, polysubstance abuse, PTSD?) Signs/Symptoms: mild fat depletion, mild muscle depletion, moderate fat depletion, moderate muscle depletion, percent weight loss Percent weight loss: 20 % (within 2 months) Interventions: Ensure Enlive (each supplement provides 350kcal and 20 grams of protein), MVI, Refer to RD note for  recommendations  Consultants: Psychiatry  Procedures: None  Antimicrobials: Rocephin IV- completed  Anti-infectives (From admission, onward)    Start     Dose/Rate Route Frequency Ordered Stop   04/27/21 1000  cefTRIAXone (ROCEPHIN) 2 g in sodium chloride 0.9 % 100 mL IVPB        2 g 200 mL/hr over 30 Minutes Intravenous Every 24 hours 04/26/21 1244 04/29/21 1143   04/25/21 1530  cefTRIAXone (ROCEPHIN) 1 g in sodium chloride 0.9 % 100 mL IVPB  Status:  Discontinued        1 g 200 mL/hr over 30 Minutes Intravenous Every 24 hours 04/25/21 1436 04/26/21 1244   04/25/21 0100  vancomycin (VANCOREADY) IVPB 1500 mg/300 mL  Status:  Discontinued        1,500 mg 150 mL/hr over 120 Minutes Intravenous Every 12 hours 04/24/21 1509 04/25/21 1436   04/24/21 2300  cefTRIAXone (ROCEPHIN) 1 g in sodium chloride 0.9 % 100 mL IVPB  Status:  Discontinued        1 g 200 mL/hr over 30 Minutes Intravenous Every 24 hours 04/24/21 2209 04/24/21 2211   04/24/21 1500  Ampicillin-Sulbactam (UNASYN) 3  g in sodium chloride 0.9 % 100 mL IVPB  Status:  Discontinued        3 g 200 mL/hr over 30 Minutes Intravenous  Once 04/24/21 1445 04/24/21 1452   04/24/21 1500  ceFEPIme (MAXIPIME) 1 g in sodium chloride 0.9 % 100 mL IVPB  Status:  Discontinued        1 g 200 mL/hr over 30 Minutes Intravenous  Once 04/24/21 1453 04/24/21 1918   04/24/21 1500  azithromycin (ZITHROMAX) 500 mg in sodium chloride 0.9 % 250 mL IVPB        500 mg 250 mL/hr over 60 Minutes Intravenous  Once 04/24/21 1453 04/24/21 1808   04/24/21 1500  vancomycin (VANCOREADY) IVPB 1000 mg/200 mL        1,000 mg 200 mL/hr over 60 Minutes Intravenous  Once 04/24/21 1456 04/24/21 2109      Culture/Microbiology    Component Value Date/Time   SDES TRACHEAL ASPIRATE 04/24/2021 1919   SPECREQUEST NONE 04/24/2021 1919   CULT  04/24/2021 1919    RARE Normal respiratory flora-no Staph aureus or Pseudomonas seen Performed at [facility],  [place] [address], [place], [place] [address]    REPTSTATUS 04/27/2021 FINAL 04/24/2021 1919    Data Reviewed: I have personally reviewed the following labs and imaging studies.    CBC: Recent Labs  Lab 04/25/21 0429 04/25/21 0442 04/26/21 0639 04/28/21 0330 04/29/21 1221 04/30/21 0221 04/30/21 1425 05/01/21 0251  WBC 9.7  --  7.7 7.2  --  7.0  --  6.2  HGB 7.1*   < > 8.1* 7.7* 8.6* 7.4* 9.5* 8.0*  HCT 23.7*   < > 27.5* 26.0* 29.9* 25.3* 32.5* 27.7*  MCV 80.6  --  81.6 81.5  --  81.6  --  82.0  PLT 253  --  256 254  --  267  --  269   < > = values in this interval not displayed.    Basic Metabolic Panel: Recent Labs  Lab 04/24/21 1949 04/24/21 2015 04/25/21 0429 04/25/21 0442 04/26/21 0639 04/28/21 0330 04/30/21 0221 05/01/21 0251  NA  --    < > 137 139 135 137 136 139  K  --    < > 3.4* 3.3* 4.2 3.8 3.9 4.2  CL  --   --  110  --  107 108 104 106  CO2  --   --  22  --  _0 GLUCOSE  --   --  99  --  95 74 102* 101*  BUN  --   --  8  --  6 <5* 5* 5*  CREATININE  --   --  0.79  --  0.79 0.71 0.75 0.77  CALCIUM  --   --  7.5*  --  8.0* 8.2* 8.1* 8.4*  MG 1.6*  --  1.9  --  1.8  --   --   --   PHOS 2.3*  --  3.0  --  2.9  --   --   --    < > = values in this interval not displayed.    GFR: Estimated Creatinine Clearance: 144.5 mL/min (by C-G formula based on SCr of 0.77 mg/dL). Liver Function Tests: Recent Labs  Lab 04/26/21 0639  AST 20  ALT 19  ALKPHOS 130*  BILITOT 0.5  PROT 4.2*  ALBUMIN 1.7*    Recent Labs  Lab 04/25/21 1419  LIPASE 23    No results  for input(s): AMMONIA in the last 168 hours. Coagulation Profile: No results for input(s): INR, PROTIME in the last 168 hours. Cardiac Enzymes: No results for input(s): CKTOTAL, CKMB, CKMBINDEX, TROPONINI in the last 168 hours. BNP (last 3 results) No results for input(s): PROBNP in the last 8760 hours. HbA1C: No results for input(s): HGBA1C in the last 72 hours. CBG: Recent Labs  Lab  04/30/21 2003 04/30/21 2327 05/01/21 0355 05/01/21 0738 05/01/21 1112  GLUCAP 81 94 87 123* 83    Lipid Profile: No results for input(s): CHOL, HDL, LDLCALC, TRIG, CHOLHDL, LDLDIRECT in the last 72 hours. Thyroid Function Tests: No results for input(s): TSH, T4TOTAL, FREET4, T3FREE, THYROIDAB in the last 72 hours.  Anemia Panel: Recent Labs    04/29/21 1431 04/30/21 0221  FERRITIN  --  14*  IRON 23*  --     Sepsis Labs: No results for input(s): PROCALCITON, LATICACIDVEN in the last 168 hours.  Recent Results (from the past 240 hour(s))  Culture, blood (single)     Status: None   Collection Time: 04/24/21  3:26 PM   Specimen: BLOOD RIGHT HAND  Result Value Ref Range Status   Specimen Description BLOOD RIGHT HAND  Final   Special Requests   Final    BOTTLES DRAWN AEROBIC ONLY Blood Culture adequate volume   Culture   Final    NO GROWTH 5 DAYS Performed at [facility], [address], [place], [place] [address]    Report Status 04/29/2021 FINAL  Final  Blood culture (routine x 2)     Status: None   Collection Time: 04/24/21  3:26 PM   Specimen: BLOOD  Result Value Ref Range Status   Specimen Description BLOOD  Final   Special Requests NONE  Final   Culture   Final    NO GROWTH 5 DAYS Performed at [facility], [address] [place], [place] [address]    Report Status 04/29/2021 FINAL  Final  Blood culture (routine x 2)     Status: None   Collection Time: 04/24/21  3:26 PM   Specimen: BLOOD  Result Value Ref Range Status   Specimen Description BLOOD  Final   Special Requests NONE  Final   Culture   Final    NO GROWTH 5 DAYS Performed at [facility], [address], [place], [place] [address]    Report Status 04/29/2021 FINAL  Final  MRSA Next Gen by PCR, Nasal     Status: None   Collection Time: 04/24/21  7:18 PM   Specimen: Nasal Mucosa; Nasal Swab  Result Value Ref Range Status   MRSA by PCR Next Gen NOT DETECTED NOT DETECTED Final    Comment:  (NOTE) The GeneXpert MRSA Assay (FDA approved for NASAL specimens only), is one component of a comprehensive MRSA colonization surveillance program. It is not intended to diagnose MRSA infection nor to guide or monitor treatment for MRSA infections. Test performance is not FDA approved in patients less than 69 years old. Performed at [facility], [place] [address], [place], [place] [address]   Culture, Respiratory w Gram Stain     Status: None   Collection Time: 04/24/21  7:19 PM   Specimen: Tracheal Aspirate; Respiratory  Result Value Ref Range Status   Specimen Description TRACHEAL ASPIRATE  Final   Special Requests NONE  Final   Gram Stain   Final    RARE SQUAMOUS EPITHELIAL CELLS PRESENT ABUNDANT WBC PRESENT, PREDOMINANTLY PMN RARE GRAM POSITIVE COCCI  Culture   Final    RARE Normal respiratory flora-no Staph aureus or Pseudomonas seen Performed at [place] [address], [place], [place] [address]    Report Status 04/27/2021 FINAL  Final  Resp Panel by RT-PCR (Flu A&B, Covid) Nasopharyngeal Swab     Status: None   Collection Time: 04/27/21  1:17 AM   Specimen: Nasopharyngeal Swab; Nasopharyngeal(NP) swabs in vial transport medium  Result Value Ref Range Status   SARS Coronavirus 2 by RT PCR NEGATIVE NEGATIVE Final    Comment: (NOTE) SARS-CoV-2 target nucleic acids are NOT DETECTED.  The SARS-CoV-2 RNA is generally detectable in upper respiratory specimens during the acute phase of infection. The lowest concentration of SARS-CoV-2 viral copies this assay can detect is 138 copies/mL. A negative result does not preclude SARS-Cov-2 infection and should not be used as the sole basis for treatment or other patient management decisions. A negative result may occur with  improper specimen collection/handling, submission of specimen other than nasopharyngeal swab, presence of viral mutation(s) within the areas targeted by this assay, and inadequate number of  viral copies(<138 copies/mL). A negative result must be combined with clinical observations, patient history, and epidemiological information. The expected result is Negative.  Fact Sheet for Patients:  EntrepreneurPulse.com.au  Fact Sheet for Healthcare Providers:  IncredibleEmployment.be  This test is no t yet approved or cleared by the [place] FDA and  has been authorized for detection and/or diagnosis of SARS-CoV-2 by FDA under an Emergency Use Authorization (EUA). This EUA will remain  in effect (meaning this test can be used) for the duration of the COVID-19 declaration under Section 564(b)(1) of the Act, 21 U.S.C.section 360bbb-3(b)(1), unless the authorization is terminated  or revoked sooner.       Influenza A by PCR NEGATIVE NEGATIVE Final   Influenza B by PCR NEGATIVE NEGATIVE Final    Comment: (NOTE) The Xpert Xpress SARS-CoV-2/FLU/RSV plus assay is intended as an aid in the diagnosis of influenza from Nasopharyngeal swab specimens and should not be used as a sole basis for treatment. Nasal washings and aspirates are unacceptable for Xpert Xpress SARS-CoV-2/FLU/RSV testing.  Fact Sheet for Patients: EntrepreneurPulse.com.au  Fact Sheet for Healthcare Providers: IncredibleEmployment.be  This test is not yet approved or cleared by the [place] FDA and has been authorized for detection and/or diagnosis of SARS-CoV-2 by FDA under an Emergency Use Authorization (EUA). This EUA will remain in effect (meaning this test can be used) for the duration of the COVID-19 declaration under Section 564(b)(1) of the Act, 21 U.S.C. section 360bbb-3(b)(1), unless the authorization is terminated or revoked.  Performed at [facility], [place] [address], [place], [place] [address]   Surgical pcr screen     Status: None   Collection Time: 04/29/21 10:26 PM   Specimen: Nasal Mucosa; Nasal Swab   Result Value Ref Range Status   MRSA, PCR NEGATIVE NEGATIVE Final   Staphylococcus aureus NEGATIVE NEGATIVE Final    Comment: (NOTE) The Xpert SA Assay (FDA approved for NASAL specimens in patients 7 years of age and older), is one component of a comprehensive surveillance program. It is not intended to diagnose infection nor to guide or monitor treatment. Performed at [facility], [place] [address] [place], [place] [address]      Radiology Studies: No results found.   LOS: 7 days   Flora Lipps, MD [facility] 05/01/2021, 1:17 PM

## 2021-05-01 NOTE — Progress Notes (Signed)
Post Fall   Patient had an unwitnessed fall in room, states he was reaching for his urinal that had falled on the floor and fell out of bed before the NT or RN could assist pt. Bed alarm was on, call bell was in bed. Pt did not follow instructions to wait for assistance.   05/01/21 1352  Vitals  BP 124/81  MAP (mmHg) 93  BP Location Left Arm  BP Method Automatic  Patient Position (if appropriate) Sitting  Pulse Rate (!) 120  Pulse Rate Source Dinamap  Resp 20  Level of Consciousness  Level of Consciousness Alert  MEWS COLOR  MEWS Score Color Yellow  Oxygen Therapy  SpO2 100 %  O2 Device Room Air  Pain Assessment  Pain Scale 0-10  Pain Score 5  Pain Type Acute pain  Pain Location Head  Pain Orientation Lower;Left;Right  Pain Descriptors / Indicators Headache  Pain Frequency Constant  Pain Intervention(s) Medication (See eMAR)  MEWS Score  MEWS Temp 0  MEWS Systolic 0  MEWS Pulse 2  MEWS RR 0  MEWS LOC 0  MEWS Score 2    Pt is tachycardic at 120, pt complains of pain at the base of head, no visible injuries. MD made aware, see new orders.   Pt assisted back to bed and re-educated to use the call bell in bed to request assistance.

## 2021-05-02 ENCOUNTER — Encounter (HOSPITAL_COMMUNITY): Payer: Self-pay | Admitting: Gastroenterology

## 2021-05-02 LAB — BASIC METABOLIC PANEL
Anion gap: 5 (ref 5–15)
BUN: 6 mg/dL (ref 6–20)
CO2: 29 mmol/L (ref 22–32)
Calcium: 8.4 mg/dL — ABNORMAL LOW (ref 8.9–10.3)
Chloride: 102 mmol/L (ref 98–111)
Creatinine, Ser: 0.75 mg/dL (ref 0.61–1.24)
GFR, Estimated: >60 mL/min (ref >60)
Glucose, Bld: 102 mg/dL — ABNORMAL HIGH (ref 70–99)
Potassium: 4.3 mmol/L (ref 3.5–5.1)
Sodium: 136 mmol/L (ref 135–145)

## 2021-05-02 LAB — CBC
HCT: 29.5 % — ABNORMAL LOW (ref 39.0–52.0)
Hemoglobin: 8.6 g/dL — ABNORMAL LOW (ref 13.0–17.0)
MCH: 24.2 pg — ABNORMAL LOW (ref 26.0–34.0)
MCHC: 29.2 g/dL — ABNORMAL LOW (ref 30.0–36.0)
MCV: 82.9 fL (ref 80.0–100.0)
Platelets: 264 10*3/uL (ref 150–400)
RBC: 3.56 MIL/uL — ABNORMAL LOW (ref 4.22–5.81)
RDW: 18.5 % — ABNORMAL HIGH (ref 11.5–15.5)
WBC: 6.1 10*3/uL (ref 4.0–10.5)
nRBC: 0 % (ref 0.0–0.2)

## 2021-05-02 LAB — GLUCOSE, CAPILLARY
Glucose-Capillary: 100 mg/dL — ABNORMAL HIGH (ref 70–99)
Glucose-Capillary: 72 mg/dL (ref 70–99)
Glucose-Capillary: 119 mg/dL — ABNORMAL HIGH (ref 70–99)
Glucose-Capillary: 103 mg/dL — ABNORMAL HIGH (ref 70–99)
Glucose-Capillary: 100 mg/dL — ABNORMAL HIGH (ref 70–99)

## 2021-05-02 LAB — MAGNESIUM: Magnesium: 1.8 mg/dL (ref 1.7–2.4)

## 2021-05-02 MED ORDER — ACETAMINOPHEN 325 MG PO TABS: 650.0000 mg | ORAL_TABLET | ORAL | Status: DC | PRN

## 2021-05-02 MED ORDER — DIAZEPAM 5 MG PO TABS: 10.0000 mg | ORAL_TABLET | Freq: Two times a day (BID) | ORAL | Status: DC

## 2021-05-02 MED ORDER — NICOTINE 21 MG/24HR TD PT24
21.0000 mg | MEDICATED_PATCH | Freq: Every day | TRANSDERMAL | Status: DC
  Administered 2021-05-02 – 2021-05-04 (×3): 21 mg via TRANSDERMAL
  Filled 2021-05-02 (×3): qty 1

## 2021-05-02 MED ORDER — LIDOCAINE 5 % EX PTCH
1.0000 | MEDICATED_PATCH | CUTANEOUS | Status: DC
  Administered 2021-05-02 – 2021-05-03 (×2): 1 via TRANSDERMAL
  Filled 2021-05-02 (×2): qty 1

## 2021-05-02 MED ORDER — METHADONE HCL 10 MG PO TABS
10.0000 mg | ORAL_TABLET | Freq: Once | ORAL | Status: AC
  Administered 2021-05-02: 10 mg via ORAL
  Filled 2021-05-02: qty 1

## 2021-05-02 MED ORDER — METHADONE HCL 10 MG PO TABS
60.0000 mg | ORAL_TABLET | Freq: Every day | ORAL | Status: DC
  Administered 2021-05-03 – 2021-05-04 (×2): 60 mg via ORAL
  Filled 2021-05-02 (×2): qty 6

## 2021-05-02 MED ORDER — ZOLPIDEM TARTRATE 5 MG PO TABS: 10.0000 mg | ORAL_TABLET | Freq: Every evening | ORAL | Status: DC | PRN

## 2021-05-02 MED ORDER — DIAZEPAM 5 MG PO TABS
5.0000 mg | ORAL_TABLET | Freq: Two times a day (BID) | ORAL | Status: DC
  Administered 2021-05-02 – 2021-05-03 (×2): 5 mg via ORAL
  Filled 2021-05-02 (×2): qty 1

## 2021-05-02 MED ORDER — ORAL CARE MOUTH RINSE: 15.0000 mL | Freq: Three times a day (TID) | OROMUCOSAL | Status: DC | PRN

## 2021-05-02 NOTE — Progress Notes (Signed)
Inpatient Rehab Admissions Coordinator:   Discussed case with Dr. Riley Kill this morning.  Given complex psychiatric history, and apparent active psych behaviors, we cannot manage this patient on CIR.  No return call from pt's father.  I spoke to Chase Picket and Fabio Neighbors with [facility] team who will f/u with patient.  CIR will sign off at this time.    Estill Dooms, PT, DPT Admissions Coordinator [phone] 05/02/21  11:41 AM

## 2021-05-02 NOTE — Consult Note (Addendum)
[facility] Consult   Reason for Consult:   Anxiety and depression "on multiple psych meds but seems to not be taking them at home." Referring Physician:  [name], MD Patient Identification: Vincent Green MRN:  [id number] Principal Diagnosis: <principal problem not specified> Diagnosis:  Active Problems:   Microcytic anemia   Respiratory arrest ([place])   Acute hypoxemic respiratory failure ([place])   Malnutrition of moderate degree   Iron deficiency anemia   Esophageal dysphagia   Esophageal stricture   Gastritis and gastroduodenitis   Acute esophagitis  Assessment  Vincent Green is a 25 y.o. male admitted medically  04/24/2021 11:31 AM for cellulitis and seizure.Patient carries the psychiatric diagnoses of ADHD, PTSD, insomnia and anxiety  and has a past medical history of   Seizure disorder and scoliosis .Psychiatry was consulted for concern for patient medication regimen.   He meets criteria for PTSD and anxiety based on hx per father and EMR.  Outpatient psychotropic medications include Valium for Anxiety and Ambien for Insomnia  and historically he has had a good response to these medications. He was mostly compliant with medications prior to admission as evidenced by dad endorsing not being very concerned, but also endorsing that dad still managed patient's adherence to his Depakote for seizures. On initial examination, patient appeared oversedated and very drowsy. We plan to recommend psychotropic interventions to assist with patient's anxiety and reported mood dysregulation noted by dad .    On assessment today patient appears more drowsy than when seen on Friday; however, patient is oriented and able to participate in assessment.  Patient continues to be preoccupied with pain medication regimen and endorsing that he is in constant pain.  Despite improvements regarding patient's edema in his extremities noted on physical exam, patient continues to endorse significant pain  in the same extremities.  Patient is also endorsing new pain and able to endorse that it is "neuropathic" in his feet.  Patient continues to use medical terminology and recite his previous controlled substance medication dosages and request increase to his reported previous doses.  Patient's behavior remains concerning for med seeking behavior.  Patient has not noted to have any repeat behaviors concerning for malingering or fictitious behavior over the weekend; however on physical exam patient was noted to have a possible callus on his third knuckle indicating a history of self-induced emesis.  Her main concern that this may be malingering versus fictitious disorder versus ARFID.  Patient does appear to be complying with dietary arrangements.  Regarding patient's PTSD he continues to endorse hyperarousal and intrusive thoughts although endorses that his prazosin has significantly decreased his nightmares.  However, due to patient's extreme sensitivity regarding medication changes will continue as is.  Would consider a reconsult to neurology to assist with the possibility of returning to 1 primary antiepileptic, specifically Depakote.  Patient appears to be doing well with Depakote and has not been endorsing migraines.  Patient does endorse "seizures" however as this has not been noted in the medical record, again this remains concerning for patient attempting to malinger a possible factitious disorder.  Would also be interested in neurology's perspective regarding authenticity of organic seizure disorder versus seizure disorder secondary to long-term benzodiazepine use with known history of seizure during downward titration of benzodiazepines in 2020.   Labs Reviewed: TSH- 2.905, T4-1.07, T3- pending, RPR- neg, Lipase- WNL, CBC- Hgb 7.7 CMP- Albumin 1.7, T protein 4.2, ALP 130 UDS + (THC, Benzos[prescribed], Amphetamines  [prescribed])   Plan Seizure disorder PTSD  Anxiety Concern for malingering vs  Factitious disorder vs restrictive eating disorder - Would recommend Depakote 247m daily and 750QHS, seizures and concern for possible Bipolar disorder. Prefer to have larger dose at night due to sedation side effect - Continue Valium 550mBID, concerned that patient may have been post-ictal this AM, patient has a hx of seizures on downward titration of Benzos - Continue Prazosin 43m143m Recommend consult to SW for referrals to OP Psych   Malnutrition -Per primary team - Recommend vitamin C level - Recommend vitamin C supplementation   Safety  At this time patient appears to be of minimal risk of harm to himself. This is based off of patient's presentation and collateral from father. However, patient's possibility of inducing illness symptoms is concerning and suggest he need closer observation. -Recommend 1:1   Dispo - Per primary   Guardian - Per father, patient is legally his own guardian Thank you for this consult. We will continue to follow.   Total Time spent with patient: 20 minutes  Subjective:   Vincent Green a [age]o. male patient admitted with  PMHx of PTSD, depression/anxiety, seizure disorder, microcytic anemia, and polysubstance use with ?murmur who initially [facility]6 with worsening lower extremity swelling and pain for two days and [facility] after became altered with pinpoint pupils for which given Narcan with improvement. However, noted to have witnessed seizure - becoming hypoxic, suspect aspiration requiring intubation for airway protection.    HPI:   On assessment today patient is alert and oriented.  Patient discusses falling, having a seizure yesterday.  Patient is able to recall how he fell - trying to get to his urinal.  Patient reports that he was attempting to have to keep nurses from cleaning up after him.  Per EMR patient received a head CT after a fall for precautionary measures.  On assessment today patient reports he  "knows" that he had a seizure during his head CT because he woke up with vomit on him, and was told he had a seizure (this is not documented in EMR).On assessment today his feet are "tingling and swollen" and complains of "neuropathy" and discussed a finger prick test.  When provider assess patient for mood he reports he is "very stressed out and anxous" because of his mother's death 4 months ago.  However, patient appears overall similar to previous presentations.  Patient reports that he is drinking well s/p dilation of his esophagus over the weekend.  Patient reports that his nightmares have improved with prazosin; however he believes he is sleeping "poorly" because he is going to bed "around 3:45 AM."  Patient reports that despite the nightmares having significantly improved he is still having flashbacks and hyperarousal symptoms.  Patient overall says that his mood today is "tired and hurting."  Patient denies SI, HI and AVH on assessment today.    Overall focused on somatic complaints through interview.  Patient also reports he was around 16 5en he was diagnosed with seizures. Doesn't remember what was going on when he had his first seizure, patient reports "everything was normal" reports he was living with his father in apartment complex at the time.  Patient reports he thinks he had a seizure d/t diazepam d/c.                       Past Medical History:  Past Medical History:  Diagnosis Date  . ADHD   .  Anxiety   . Anxiety   . Chronic dental pain   . COPD (chronic obstructive pulmonary disease) ([place])   . Insomnia   . PTSD (post-traumatic stress disorder)   . PTSD (post-traumatic stress disorder)   . Scoliosis   . Seizures ([facility])    benzo-seizure. last seizure 01/2019 per pt (documented 08/12/19)  . Tachycardia     Past Surgical History:  Procedure Laterality Date  . CHOANAL ATRESIA REPAIR    . NOSE SURGERY    . SKIN GRAFT     Family History:  Family History  Problem  Relation Age of Onset  . Other Mother 15       04/2019 autopsy pending  . GER disease Paternal Grandmother   . Colon cancer Neg Hx    Social History:  Social History   Substance and Sexual Activity  Alcohol Use No     Social History   Substance and Sexual Activity  Drug Use Not Currently  . Types: Benzodiazepines, Marijuana    Social History   Socioeconomic History  . Marital status: Single    Spouse name: Not on file  . Number of children: Not on file  . Years of education: Not on file  . Highest education level: Not on file  Occupational History  . Not on file  Tobacco Use  . Smoking status: Every Day    Packs/day: 0.50    Types: Cigarettes  . Smokeless tobacco: Never  . Tobacco comments:    smokes 6 cigarettes daily  Vaping Use  . Vaping Use: Former  Substance and Sexual Activity  . Alcohol use: No  . Drug use: Not Currently    Types: Benzodiazepines, Marijuana  . Sexual activity: Not on file  Other Topics Concern  . Not on file  Social History Narrative  . Not on file   Social Determinants of Health   Financial Resource Strain: Not on file  Food Insecurity: Not on file  Transportation Needs: Not on file  Physical Activity: Not on file  Stress: Not on file  Social Connections: Not on file   Additional Social History:    Allergies:   Allergies  Allergen Reactions  . Tessalon [Benzonatate] Swelling    Throat swelling    Labs:  Results for orders placed or performed during the hospital encounter of 04/24/21 (from the past 48 hour(s))  Hemoglobin and hematocrit, blood     Status: Abnormal   Collection Time: 04/30/21  2:25 PM  Result Value Ref Range   Hemoglobin 9.5 (L) 13.0 - 17.0 g/dL    Comment: REPEATED TO VERIFY   HCT 32.5 (L) 39.0 - 52.0 %    Comment: Performed at [facility] [address] [place], [place] [address]  Glucose, capillary     Status: Abnormal   Collection Time: 04/30/21  3:55 PM  Result Value Ref Range    Glucose-Capillary 106 (H) 70 - 99 mg/dL    Comment: Glucose reference range applies only to samples taken after fasting for at least 8 hours.  Glucose, capillary     Status: None   Collection Time: 04/30/21  8:03 PM  Result Value Ref Range   Glucose-Capillary 81 70 - 99 mg/dL    Comment: Glucose reference range applies only to samples taken after fasting for at least 8 hours.  Glucose, capillary     Status: None   Collection Time: 04/30/21 11:27 PM  Result Value Ref Range   Glucose-Capillary 94 70 - 99  mg/dL    Comment: Glucose reference range applies only to samples taken after fasting for at least 8 hours.  Basic metabolic panel     Status: Abnormal   Collection Time: 05/01/21  2:51 AM  Result Value Ref Range   Sodium 139 135 - 145 mmol/L   Potassium 4.2 3.5 - 5.1 mmol/L   Chloride 106 98 - 111 mmol/L   CO2 27 22 - 32 mmol/L   Glucose, Bld 101 (H) 70 - 99 mg/dL    Comment: Glucose reference range applies only to samples taken after fasting for at least 8 hours.   BUN 5 (L) 6 - 20 mg/dL   Creatinine, Ser 0.77 0.61 - 1.24 mg/dL   Calcium 8.4 (L) 8.9 - 10.3 mg/dL   GFR, Estimated >60 >60 mL/min    Comment: (NOTE) Calculated using the CKD-EPI Creatinine Equation (2021)    Anion gap 6 5 - 15    Comment: Performed at [facility] [address], [place], [place] [address]  CBC     Status: Abnormal   Collection Time: 05/01/21  2:51 AM  Result Value Ref Range   WBC 6.2 4.0 - 10.5 K/uL   RBC 3.38 (L) 4.22 - 5.81 MIL/uL   Hemoglobin 8.0 (L) 13.0 - 17.0 g/dL   HCT 27.7 (L) 39.0 - 52.0 %   MCV 82.0 80.0 - 100.0 fL   MCH 23.7 (L) 26.0 - 34.0 pg   MCHC 28.9 (L) 30.0 - 36.0 g/dL   RDW 18.1 (H) 11.5 - 15.5 %   Platelets 269 150 - 400 K/uL   nRBC 0.0 0.0 - 0.2 %    Comment: Performed at [facility], [place] [address], [place], [place] [address]  Glucose, capillary     Status: None   Collection Time: 05/01/21  3:55 AM  Result Value Ref Range   Glucose-Capillary 87 70 - 99  mg/dL    Comment: Glucose reference range applies only to samples taken after fasting for at least 8 hours.  Glucose, capillary     Status: Abnormal   Collection Time: 05/01/21  7:38 AM  Result Value Ref Range   Glucose-Capillary 123 (H) 70 - 99 mg/dL    Comment: Glucose reference range applies only to samples taken after fasting for at least 8 hours.  Glucose, capillary     Status: None   Collection Time: 05/01/21 11:12 AM  Result Value Ref Range   Glucose-Capillary 83 70 - 99 mg/dL    Comment: Glucose reference range applies only to samples taken after fasting for at least 8 hours.  Glucose, capillary     Status: None   Collection Time: 05/01/21  2:25 PM  Result Value Ref Range   Glucose-Capillary 80 70 - 99 mg/dL    Comment: Glucose reference range applies only to samples taken after fasting for at least 8 hours.  Glucose, capillary     Status: Abnormal   Collection Time: 05/01/21  4:37 PM  Result Value Ref Range   Glucose-Capillary 67 (L) 70 - 99 mg/dL    Comment: Glucose reference range applies only to samples taken after fasting for at least 8 hours.  Glucose, capillary     Status: None   Collection Time: 05/01/21  7:17 PM  Result Value Ref Range   Glucose-Capillary 74 70 - 99 mg/dL    Comment: Glucose reference range applies only to samples taken after fasting for at least 8 hours.  Glucose, capillary  Status: None   Collection Time: 05/01/21 11:18 PM  Result Value Ref Range   Glucose-Capillary 70 70 - 99 mg/dL    Comment: Glucose reference range applies only to samples taken after fasting for at least 8 hours.  Glucose, capillary     Status: Abnormal   Collection Time: 05/02/21  3:08 AM  Result Value Ref Range   Glucose-Capillary 100 (H) 70 - 99 mg/dL    Comment: Glucose reference range applies only to samples taken after fasting for at least 8 hours.  Basic metabolic panel     Status: Abnormal   Collection Time: 05/02/21  3:45 AM  Result Value Ref Range   Sodium  136 135 - 145 mmol/L   Potassium 4.3 3.5 - 5.1 mmol/L   Chloride 102 98 - 111 mmol/L   CO2 29 22 - 32 mmol/L   Glucose, Bld 102 (H) 70 - 99 mg/dL    Comment: Glucose reference range applies only to samples taken after fasting for at least 8 hours.   BUN 6 6 - 20 mg/dL   Creatinine, Ser 0.75 0.61 - 1.24 mg/dL   Calcium 8.4 (L) 8.9 - 10.3 mg/dL   GFR, Estimated >60 >60 mL/min    Comment: (NOTE) Calculated using the CKD-EPI Creatinine Equation (2021)    Anion gap 5 5 - 15    Comment: Performed at [facility] [address] [place], [place] [address]  CBC     Status: Abnormal   Collection Time: 05/02/21  3:45 AM  Result Value Ref Range   WBC 6.1 4.0 - 10.5 K/uL   RBC 3.56 (L) 4.22 - 5.81 MIL/uL   Hemoglobin 8.6 (L) 13.0 - 17.0 g/dL   HCT 29.5 (L) 39.0 - 52.0 %   MCV 82.9 80.0 - 100.0 fL   MCH 24.2 (L) 26.0 - 34.0 pg   MCHC 29.2 (L) 30.0 - 36.0 g/dL   RDW 18.5 (H) 11.5 - 15.5 %   Platelets 264 150 - 400 K/uL   nRBC 0.0 0.0 - 0.2 %    Comment: Performed at [facility], [place] [address], [place], [place] [address]  Magnesium     Status: None   Collection Time: 05/02/21  3:45 AM  Result Value Ref Range   Magnesium 1.8 1.7 - 2.4 mg/dL    Comment: Performed at [facility] [address] [place], [place] [address]  Glucose, capillary     Status: None   Collection Time: 05/02/21  8:02 AM  Result Value Ref Range   Glucose-Capillary 72 70 - 99 mg/dL    Comment: Glucose reference range applies only to samples taken after fasting for at least 8 hours.   Comment 1 Notify RN    Comment 2 Document in Chart     Current Facility-Administered Medications  Medication Dose Route Frequency Provider Last Rate Last Admin  . 0.9 %  sodium chloride infusion (Manually program via Guardrails IV Fluids)   Intravenous Once Cirigliano, Vito V, DO      . acetaminophen (TYLENOL) tablet 650 mg  650 mg Oral Q6H PRN Cirigliano, Vito V, DO   650 mg at 05/01/21 1412  . chlorhexidine  gluconate (MEDLINE KIT) (PERIDEX) 0.12 % solution 15 mL  15 mL Mouth Rinse BID Cirigliano, Vito V, DO   15 mL at 05/02/21 0821  . dextrose 5 %-0.9 % sodium chloride infusion   Intravenous Continuous Cirigliano, Vito V, DO 50 mL/hr at 05/01/21 1940 New  Bag at 05/01/21 1940  . diazepam (VALIUM) tablet 5 mg  5 mg Oral BID Cirigliano, Vito V, DO   5 mg at 05/02/21 0821  . docusate sodium (COLACE) capsule 100 mg  100 mg Oral BID PRN Cirigliano, Vito V, DO   100 mg at 04/30/21 1750  . enoxaparin (LOVENOX) injection 40 mg  40 mg Subcutaneous QHS Cirigliano, Vito V, DO   40 mg at 05/01/21 2115  . feeding supplement (ENSURE ENLIVE / ENSURE PLUS) liquid 237 mL  237 mL Oral TID BM Cirigliano, Vito V, DO   237 mL at 05/02/21 0822  . ferric gluconate (FERRLECIT) 250 mg in sodium chloride 0.9 % 250 mL IVPB  250 mg Intravenous Daily Cirigliano, Vito V, DO 135 mL/hr at 05/02/21 0950 250 mg at 05/02/21 0950  . ferrous sulfate tablet 325 mg  325 mg Oral Q breakfast Cirigliano, Vito V, DO   325 mg at 05/02/21 7116  . folic acid (FOLVITE) tablet 1 mg  1 mg Oral Daily Cirigliano, Vito V, DO   1 mg at 05/02/21 0821  . MEDLINE mouth rinse  15 mL Mouth Rinse TID PRN Pokhrel, Laxman, MD      . Derrill Memo ON 05/03/2021] methadone (DOLOPHINE) tablet 60 mg  60 mg Oral Daily Pokhrel, Laxman, MD      . metoCLOPramide (REGLAN) injection 10 mg  10 mg Intravenous Q6H PRN Pokhrel, Laxman, MD   10 mg at 05/01/21 1758  . multivitamin with minerals tablet 1 tablet  1 tablet Oral Daily Cirigliano, Vito V, DO   1 tablet at 05/02/21 0821  . nicotine (NICODERM CQ - dosed in mg/24 hours) patch 14 mg  14 mg Transdermal Daily Cirigliano, Vito V, DO   14 mg at 05/02/21 0820  . ondansetron (ZOFRAN) injection 4 mg  4 mg Intravenous Q6H PRN Pokhrel, Laxman, MD   4 mg at 05/01/21 2116  . pantoprazole (PROTONIX) EC tablet 40 mg  40 mg Oral BID Cirigliano, Vito V, DO   40 mg at 05/02/21 0827  . perampanel (FYCOMPA) tablet 4 mg  4 mg Oral QHS Cirigliano,  Vito V, DO   4 mg at 05/01/21 2114  . polyethylene glycol (MIRALAX / GLYCOLAX) packet 17 g  17 g Oral Daily PRN Cirigliano, Vito V, DO      . prazosin (MINIPRESS) capsule 1 mg  1 mg Oral QHS Cirigliano, Vito V, DO   1 mg at 05/01/21 2114  . pregabalin (LYRICA) capsule 200 mg  200 mg Oral TID Cirigliano, Vito V, DO   200 mg at 05/02/21 0820  . thiamine tablet 100 mg  100 mg Oral Daily Cirigliano, Vito V, DO   100 mg at 05/02/21 0821  . valproic acid (DEPAKENE) 250 MG/5ML solution 250 mg  250 mg Oral QAC breakfast Cirigliano, Vito V, DO   250 mg at 05/02/21 0827  . valproic acid (DEPAKENE) 250 MG/5ML solution 750 mg  750 mg Oral Q2000 Cirigliano, Vito V, DO   750 mg at 05/01/21 2113    Psychiatric Specialty Exam:  Presentation  General Appearance: Appropriate for Environment (appears abit more drowsy, but covered his lower half with a blanket today)  Eye Contact:Fair  Speech:Slow  Speech Volume:Decreased  Handedness:No data recorded  Mood and Affect  Mood:Dysphoric; Anxious  Affect:Flat   Thought Process  Thought Processes:Linear  Descriptions of Associations:Intact  Orientation:-- (AO to person, place, month, year and situation not to day)  Thought Content:Logical  History of Schizophrenia/Schizoaffective disorder:No data  recorded Duration of Psychotic Symptoms:No data recorded Hallucinations:Hallucinations: None  Ideas of Reference:None  Suicidal Thoughts:Suicidal Thoughts: No  Homicidal Thoughts:Homicidal Thoughts: No   Sensorium  Memory:Immediate Good; Recent Good; Remote Good  Judgment:-- (Improving)  Insight:Shallow   Executive Functions  Concentration:Fair  Attention Span:Fair  Recall:No data recorded Fund of Knowledge:Fair  Language:Fair   Psychomotor Activity  Psychomotor Activity:Psychomotor Activity: Decreased   Assets  Assets:Communication Skills; Resilience   Sleep  Sleep:Sleep: Poor   Physical Exam: Physical Exam HENT:      Head: Normocephalic.  Eyes:     Extraocular Movements: Extraocular movements intact.  Pulmonary:     Effort: Pulmonary effort is normal.  Skin:    General: Skin is dry.     Comments: Callous of the 3rd knuckle on the R hand  Rash on most extensor surfaces. Notable redness/scarring on R 3rd knuckle. Some wavy hairs on arms.    Nails thickened.   Neurological:     Mental Status: He is alert and oriented to person, place, and time.   Review of Systems  Musculoskeletal:  Positive for myalgias.  Skin:  Negative for rash.  Neurological:  Positive for tingling.  Psychiatric/Behavioral:  Negative for suicidal ideas. The patient is nervous/anxious.   Blood pressure 104/75, pulse 89, temperature 98.5 F (36.9 C), temperature source Oral, resp. rate 18, height 5' 10" (1.778 m), weight 75.2 kg, SpO2 100 %. Body mass index is 23.79 kg/m.   PGY-2 Freida Busman, MD 05/02/2021 12:32 PM

## 2021-05-02 NOTE — TOC Initial Note (Signed)
Transition of Care [facility]) - Initial/Assessment Note    Patient Details  Name: Vincent Green MRN: [id number] Date of Birth: 08-Jul-1995  Transition of Care [facility] CM/SW Contact:    Geralynn Ochs, LCSW Phone Number: 05/02/2021, 1:55 PM  Clinical Narrative:       CSW alerted by [facility] Admissions that they are unable to offer a bed for patient. CSW met with patient to discuss other options, either home or other inpatient rehab, and patient in agreement with looking at other rehab options. Patient also asked to speak with doctor, expressed concern about some of his medications. CSW sent MD a message to speak to patient, and sent referral to [facility] for review. CSW to follow.            Expected Discharge Plan: IP Rehab Facility Barriers to Discharge: Continued Medical Work up   Patient Goals and CMS Choice Patient states their goals for this hospitalization and ongoing recovery are:: to get better CMS Medicare.gov Compare Post Acute Care list provided to:: Patient Choice offered to / list presented to : Patient  Expected Discharge Plan and Services Expected Discharge Plan: [place]     Post Acute Care Choice: IP Rehab Living arrangements for the past 2 months: Single Family Home                                      Prior Living Arrangements/Services Living arrangements for the past 2 months: Single Family Home Lives with:: Parents Patient language and need for interpreter reviewed:: No Do you feel safe going back to the place where you live?: Yes      Need for Family Participation in Patient Care: No (Comment) Care giver support system in place?: Yes (comment)   Criminal Activity/Legal Involvement Pertinent to Current Situation/Hospitalization: No - Comment as needed  Activities of Daily Living      Permission Sought/Granted Permission sought to share information with : Facility Sport and exercise psychologist, Family Supports Permission granted to share  information with : Yes, Verbal Permission Granted  Share Information with NAME: Annie Main  Permission granted to share info w AGENCY: [facility]  Permission granted to share info w Relationship: Father     Emotional Assessment Appearance:: Appears stated age Attitude/Demeanor/Rapport: Engaged Affect (typically observed): Appropriate Orientation: : Oriented to Self, Oriented to Place, Oriented to  Time, Oriented to Situation Alcohol / Substance Use: Illicit Drugs Psych Involvement: Yes (comment)  Admission diagnosis:  Respiratory arrest ([place]) [R09.2] Acute respiratory failure, unspecified whether with hypoxia or hypercapnia ([place]) [J96.00] Acute hypoxemic respiratory failure ([place]) [J96.01] Patient Active Problem List   Diagnosis Date Noted   Esophageal stricture    Gastritis and gastroduodenitis    Acute esophagitis    Malnutrition of moderate degree 04/29/2021   Iron deficiency anemia    Esophageal dysphagia    Respiratory arrest ([place]) 04/24/2021   Acute hypoxemic respiratory failure ([place]) 04/24/2021   GERD (gastroesophageal reflux disease) 08/12/2019   Constipation 08/12/2019   Rectal bleeding 08/12/2019   Microcytic anemia 08/12/2019   RUQ pain 08/12/2019   PCP:  Leonie Douglas, MD Pharmacy:   [facility], [place] [address] Phone: [phone] Fax: [phone]     Social Determinants of Health (SDOH) Interventions    Readmission Risk Interventions No flowsheet data found.

## 2021-05-02 NOTE — Progress Notes (Signed)
Physical Therapy Treatment Patient Details Name: Vincent Green MRN: [id number] DOB: 1996-01-29 Today's Date: 05/02/2021   History of Present Illness Vincent Green, 25 y.o. male with PMH of PTSD, depression/anxiety, seizure disorder, microcytic anemia, and polysubstance use admitted for acute metabolic toxic encephalopathy with breakthrough seizure in setting of polysubstance use at [facility] where he was intubated for acute hypoxic respiratory failure initially with concerns of aspiration pneumonitis, transferred to [facility], ICU.  Extubated 04/26/21.    PT Comments    Patient progressing with fluidity of ambulation this session able to walk and push walker without stopping, but more episodes of L knee buckling than last session with mod A for safety and LOB prevention.  Patient limited due to HR 138 with ambulation and 120's at rest.  RN aware.  Feel he could still benefit from acute inpatient rehab due to level of deconditioning from bedrest during time he had LE edema (?cellulitis) prior to seizure.  Patient could go home with father assist, HHPT and w/c for home.  PT will continue to follow.acutely.  Recommendations for follow up therapy are one component of a multi-disciplinary discharge planning process, led by the attending physician.  Recommendations may be updated based on patient status, additional functional criteria and insurance authorization.  Follow Up Recommendations  Acute inpatient rehab (3hours/day)     Assistance Recommended at Discharge Intermittent Supervision/Assistance  Equipment Recommendations  BSC/3in1;Rolling walker (2 wheels)    Recommendations for Other Services       Precautions / Restrictions Precautions Precautions: Fall Precaution Comments: L LE weakness, seizure, watch HR Restrictions Weight Bearing Restrictions: No     Mobility  Bed Mobility Overal bed mobility: Needs Assistance Bed Mobility: Supine to Sit     Supine to sit: Supervision;HOB  elevated     General bed mobility comments: increased time    Transfers Overall transfer level: Needs assistance Equipment used: Rolling walker (2 wheels) Transfers: Sit to/from Stand Sit to Stand: Min assist           General transfer comment: questioning cues for hand placement    Ambulation/Gait Ambulation/Gait assistance: Mod assist Gait Distance (Feet): 50 Feet (x 2) Assistive device: Rolling walker (2 wheels) Gait Pattern/deviations: Step-through pattern;Knees buckling;Decreased stride length;Wide base of support;Knee flexed in stance - left       General Gait Details: took off initially with 2 episodes of L knee buckling needing mod A for safety, noted heart monitor with leads not attached so pt stopped to sit in hallway and once leads attached noted pt tachy with HR 138.  Sitting down to 122 so pt ambulated back to room and sustained in 120's at rest so RN called as pt c/o uncontrolled pain, reports no orders but for Tylenol and Lidoderm patch.   Stairs             Wheelchair Mobility    Modified Rankin (Stroke Patients Only)       Balance Overall balance assessment: Needs assistance Sitting-balance support: Feet supported Sitting balance-Leahy Scale: Fair Sitting balance - Comments: seated to comb his hair unsupported   Standing balance support: Bilateral upper extremity supported;Reliant on assistive device for balance Standing balance-Leahy Scale: Poor Standing balance comment: UE support for balance                            Cognition Arousal/Alertness: Awake/alert Behavior During Therapy: WFL for tasks assessed/performed Overall Cognitive Status: No family/caregiver present to determine baseline  cognitive functioning                                 General Comments: hyperverbal, overly self diagnostic, multimodal cues for safety with mobility and for focusing on task        Exercises      General Comments  General comments (skin integrity, edema, etc.): HR max 138, RN aware      Pertinent Vitals/Pain Pain Assessment: Faces Pain Score: 8  Faces Pain Scale: Hurts a little bit Pain Location: R hip, back, neck, feet Pain Descriptors / Indicators: Aching;Sharp;Throbbing Pain Intervention(s): Monitored during session;Repositioned;Heat applied    Home Living                          Prior Function            PT Goals (current goals can now be found in the care plan section) Progress towards PT goals: Progressing toward goals    Frequency    Min 3X/week      PT Plan Current plan remains appropriate    Co-evaluation              AM-PAC PT "6 Clicks" Mobility   Outcome Measure  Help needed turning from your back to your side while in a flat bed without using bedrails?: A Little Help needed moving from lying on your back to sitting on the side of a flat bed without using bedrails?: A Little Help needed moving to and from a bed to a chair (including a wheelchair)?: A Little Help needed standing up from a chair using your arms (e.g., wheelchair or bedside chair)?: A Little Help needed to walk in hospital room?: A Lot Help needed climbing 3-5 steps with a railing? : Total 6 Click Score: 15    End of Session Equipment Utilized During Treatment: Gait belt Activity Tolerance: Treatment limited secondary to medical complications (Comment) (tachycardia) Patient left: in bed;with call bell/phone within reach;with bed alarm set Nurse Communication: Other (comment) (HR sustained 120's) PT Visit Diagnosis: Muscle weakness (generalized) (M62.81);Other abnormalities of gait and mobility (R26.89)     Time: 1520-1550 PT Time Calculation (min) (ACUTE ONLY): 30 min  Charges:  $Gait Training: 8-22 mins $Therapeutic Activity: 8-22 mins                     Sheran Lawless, PT Acute Rehabilitation Services Pager:[phone] [phone] 05/02/2021    Vincent Green 05/02/2021, 5:24 PM

## 2021-05-02 NOTE — Progress Notes (Addendum)
Occupational Therapy Treatment Patient Details Name: Vincent Green MRN: [id number] DOB: May 17, 1996 Today's Date: 05/02/2021   History of present illness Vincent Green, 25 y.o. male with PMH of PTSD, depression/anxiety, seizure disorder, microcytic anemia, and polysubstance use admitted for acute metabolic toxic encephalopathy with breakthrough seizure in setting of polysubstance use at [facility] ED where he was intubated for acute hypoxic respiratory failure initially with concerns of aspiration pneumonitis, transferred to [facility], ICU.  Extubated 04/26/21.   OT comments  Vincent Green is incrementally progressing, he continues to be limited by generalized weakness, poor insight into safety and distraction/poor attention. He requires frequent cueing for all functional tasks to sequence, problem solve and for safety. Pt required min A for transfers and functional ambulation this session, and is able to complete LB dressing with min A for steadying in standing. Pt was tangential on a fall that he experienced while sitting EOB and reaching to pick up his urinal from the floor, and his medications. Pt educated on safety awareness and and use of a reacher to obtain items from the floor to prevent further falls. He continues to benefit from OT acutely. D/c recommendation remains appropriate, [facility] CIR signed off. TOC attempting [facility] admission   Recommendations for follow up therapy are one component of a multi-disciplinary discharge planning process, led by the attending physician.  Recommendations may be updated based on patient status, additional functional criteria and insurance authorization.    Follow Up Recommendations  Acute inpatient rehab (3hours/day) (CIR declined pt.)    Assistance Recommended at Discharge Other (comment)  Equipment Recommendations  None recommended by OT       Precautions / Restrictions Precautions Precautions: Fall Precaution Comments: L LE weakness, seizure, watch  HR Restrictions Weight Bearing Restrictions: No       Mobility Bed Mobility Overal bed mobility: Needs Assistance Bed Mobility: Supine to Sit     Supine to sit: Supervision;HOB elevated     General bed mobility comments: increased time    Transfers Overall transfer level: Needs assistance Equipment used: Rolling walker (2 wheels) Transfers: Sit to/from Stand Sit to Stand: Min assist           General transfer comment: questioning cues for hand placement     Balance Overall balance assessment: Needs assistance Sitting-balance support: Feet supported Sitting balance-Leahy Scale: Fair Sitting balance - Comments: seated to comb his hair unsupported   Standing balance support: Bilateral upper extremity supported;Reliant on assistive device for balance Standing balance-Leahy Scale: Poor Standing balance comment: UE support for balance         ADL either performed or assessed with clinical judgement   ADL Overall ADL's : Needs assistance/impaired                     Lower Body Dressing: Minimal assistance Lower Body Dressing Details (indicate cue type and reason): pt able to reach bilat feet in long sitting   Toilet Transfer Details (indicate cue type and reason): pt reported that he fell out of bed attempting to reach his urinal, hitting the back of his head. educated on safe transfers and use of urinal at EOB. also educated on the use of a grabber/reacher to obtain items from the floor to prevent extreme forward flexion         Functional mobility during ADLs: Minimal assistance;Rolling walker (2 wheels);Cueing for safety;Cueing for sequencing General ADL Comments: incrased time for all tasks, required verbal cues for sequencing and problem solving. Pt hyperverbal throughout and distracts  himself and causes safety concern, especially with transfers    Extremity/Trunk Assessment Upper Extremity Assessment Upper Extremity Assessment: Generalized  weakness RUE Deficits / Details: poor coordination, globally 3+/5 MMT RUE Coordination: decreased fine motor LUE Deficits / Details: poor coordination, 3+/5 MMT globally LUE Coordination: decreased fine motor   Lower Extremity Assessment Lower Extremity Assessment: Defer to PT evaluation        Vision   Vision Assessment?: No apparent visual deficits   Perception Perception Perception: Within Functional Limits   Praxis      Cognition Arousal/Alertness: Awake/alert Behavior During Therapy: WFL for tasks assessed/performed Overall Cognitive Status: No family/caregiver present to determine baseline cognitive functioning                                 General Comments: hyperverbal, overly self diagnostic, multimodal cues for safety with mobility and for focusing on task          Exercises     Shoulder Instructions       General Comments HR max 138, RN aware    Pertinent Vitals/ Pain       Pain Assessment: Faces Pain Score: 8  Faces Pain Scale: Hurts a little bit Pain Location: R hip, back, neck, feet Pain Descriptors / Indicators: Aching;Sharp;Throbbing Pain Intervention(s): Monitored during session;Repositioned;Heat applied   Frequency  Min 2X/week        Progress Toward Goals  OT Goals(current goals can now be found in the care plan section)  Progress towards OT goals: Progressing toward goals  Acute Rehab OT Goals Patient Stated Goal: get home OT Goal Formulation: With patient Time For Goal Achievement: 05/11/21 Potential to Achieve Goals: Fair ADL Goals Pt Will Perform Lower Body Bathing: with modified independence;sit to/from stand Pt Will Perform Lower Body Dressing: with modified independence;sit to/from stand Pt Will Transfer to Toilet: with modified independence;ambulating Pt Will Perform Tub/Shower Transfer: with modified independence;ambulating;shower seat Pt/caregiver will Perform Home Exercise Program: Increased  strength;Both right and left upper extremity;With written HEP provided  Plan Discharge plan remains appropriate       AM-PAC OT "6 Clicks" Daily Activity     Outcome Measure   Help from another person eating meals?: A Little Help from another person taking care of personal grooming?: A Little Help from another person toileting, which includes using toliet, bedpan, or urinal?: A Lot Help from another person bathing (including washing, rinsing, drying)?: A Lot Help from another person to put on and taking off regular upper body clothing?: A Little Help from another person to put on and taking off regular lower body clothing?: A Lot 6 Click Score: 15    End of Session Equipment Utilized During Treatment: Rolling walker (2 wheels)  OT Visit Diagnosis: Unsteadiness on feet (R26.81);Other abnormalities of gait and mobility (R26.89);Repeated falls (R29.6);Muscle weakness (generalized) (M62.81);Pain   Activity Tolerance Patient limited by pain   Patient Left in chair;with call bell/phone within reach;with chair alarm set   Nurse Communication Mobility status        Time: 5009-3818 OT Time Calculation (min): 18 min  Charges: OT General Charges $OT Visit: 1 Visit OT Treatments $Self Care/Home Management : 8-22 mins   Aryianna Earwood A Shahd Occhipinti 05/02/2021, 5:27 PM

## 2021-05-02 NOTE — Progress Notes (Addendum)
PROGRESS NOTE    Vincent Green  ZMO:[id number] DOB: [birth year] DOA: 04/24/2021 PCP: Leonie Douglas, MD   Brief Narrative/Hospital Course:  Vincent Green, 25 y.o. male with past medical history of posttraumatic stress disorder, depression/anxiety, seizure disorder, microcytic anemia, polysubstance abuse presented to the hospital with breakthrough seizures.  Patient was intubated for concerns of aspiration pneumonia from seizures.  Patient was extubated on 04/26/2021.  Patient was also seen by psychiatry for possible bipolar disorder.  Medications have been adjusted.  Patient is on chronic methadone as per the patient's father from outpatient but had been losing weight for several months.  GI was consulted on 04/29/2021 for nausea vomiting and underwent endoscopic evaluation..  Assessment & Plan:  Acute hypoxemic respiratory failure with toxic encephalopathy in the setting of polypharmacy.   Patient was intubated and subsequently extubated on 04/26/2021.  Currently on room air.  Patient was seen by psychiatry and medications have been adjusted.  Seizure disorder: Patient was intubated and subsequently extubated 04/26/21.  At this time, mentation has improved.  Continue antiepileptic agents.  Currently on diazepam, valproic acid, perampanel. Try to minimize psychotropic medications if there is somnolence.    Aspiration pneumonitis:  Completed antibiotics with IV Rocephin for 5 days.    Bilateral lower extremity cellulitis with B/l Leg edema- Patient has completed antibiotic.  Transthoracic echocardiogram on 04/25/2021 showed LV ejection fraction of 55 to 60%.  Lower extremity ultrasound negative for DVT.  Chronic back pain, on methadone and Lyrica as outpatient. Plan is to uptitrate his methadone as tolerated to 90 mg daily.  We will change from 50 mg to 60 mg today.   Polysubstance abuse urine drug screen was positive for benzo, amphetamine and THC.   PTSD/Anxiety/Depression-concern for  bipolar disorder. Patient is currently on Valium 5 mg twice daily.  Continue Depakote and perampanel.  Patient was seen by psychiatry    Social worker referral for outpatient psychology and outpatient psych.    Acute on chronic microcytic anemia: Patient presented with hemoglobin of 6.0 on presentation.  Status post 2 units of packed RBC.  Patient underwent endoscopic evaluation with esophageal dilation.  We will continue to monitor closely.  Hemoglobin of 8.6 today.  Dysphagia/nausea vomiting/20% weight loss in past 2 months:  Was mostly eating noodles at home.  GI was consulted for vomiting, nausea and underwent esophageal dilation and biopsy.  Continue Reglan, PPI.  Hypoglycemia  Encouraged oral intake.  Was on D10 initially.  We will continue to monitor.   Painful penile lesion: Will need outpatient follow-up.  Deconditioning/debility/generalized weakness  Physical therapy has recommended CIR.  Unwitnessed fall yesterday.  Patient did have a bump in his head.  We will continue ice compression.  CT head scan was negative for acute findings.  Patient is alert awake oriented with no focal neurological signs.  Disposition.  At this time patient is awaiting for CIR placement.  Patient appears to be medically stable at this time.  DVT prophylaxis: enoxaparin (LOVENOX) injection 40 mg Start: 05/01/21 2200 SCDs Start: 04/24/21 1831    Code Status: Full Code  Family Communication:  Spoke with the patient at bedside.  I also spoke with the patient's father on the phone and updated him about the clinical condition of the patient. Patient lives with his father.   Status is: Inpatient  Remains inpatient appropriate because: Awaiting for CIR placement.   Subjective:  Today, patient was seen and examined at bedside.  Patient denies overt nausea, vomiting has tolerated oral diet.  Continues to complain of pain and wishes to have hydrocodone.  Patient did have unwitnessed fall  yesterday.  Objective:  Vitals:   05/01/21 2313 05/02/21 0311 05/02/21 0500 05/02/21 0724  BP: 119/66 109/67  104/75  Pulse: 98 95  89  Resp: 16 16  18   Temp: 97.9 F (36.6 C) 98 F (36.7 C)  98.5 F (36.9 C)  TempSrc: Oral Oral  Oral  SpO2: 100% 97%  100%  Weight:   75.2 kg   Height:       Weight change: -0.5 kg  Intake/Output Summary (Last 24 hours) at 05/02/2021 1015 Last data filed at 05/01/2021 2123 Gross per 24 hour  Intake 400 ml  Output 1050 ml  Net -650 ml    Net IO Since Admission: 734.44 mL [05/02/21 1015]   Physical Examination: General:  Average built, not in obvious distress, alert awake communicative,  HENT:   No scleral pallor or icterus noted. Oral mucosa is moist. Mild bump on the back of head. Chest:  Clear breath sounds.  Diminished breath sounds bilaterally. No crackles or wheezes.  CVS: S1 &S2 heard. No murmur.  Regular rate and rhythm. Abdomen: Soft, nontender, nondistended.  Bowel sounds are heard.   Extremities: No cyanosis, clubbing with trace edema.  Peripheral pulses are palpable. Psych: Alert, awake and communicative, oriented when on conversation,  CNS:  No cranial nerve deficits.  Moves all extremities. Skin: Warm and dry.  No rashes noted.  Medications reviewed:  Scheduled Meds:  sodium chloride   Intravenous Once   chlorhexidine gluconate (MEDLINE KIT)  15 mL Mouth Rinse BID   diazepam  5 mg Oral BID   enoxaparin (LOVENOX) injection  40 mg Subcutaneous QHS   feeding supplement  237 mL Oral TID BM   ferrous sulfate  325 mg Oral Q breakfast   folic acid  1 mg Oral Daily   methadone  60 mg Oral Daily   multivitamin with minerals  1 tablet Oral Daily   nicotine  14 mg Transdermal Daily   pantoprazole  40 mg Oral BID   perampanel  4 mg Oral QHS   prazosin  1 mg Oral QHS   pregabalin  200 mg Oral TID   thiamine  100 mg Oral Daily   valproic acid  250 mg Oral QAC breakfast   valproic acid  750 mg Oral Q2000   Continuous  Infusions:  dextrose 5 % and 0.9% NaCl 50 mL/hr at 05/01/21 1940   ferric gluconate (FERRLECIT) IVPB 250 mg (05/02/21 0950)   Moderate protein calorie malnutrition.  Continue nutritional supplements. Continue nutritional supplements.  Nutrition Problem: Moderate Malnutrition Etiology: chronic illness (COPD, polysubstance abuse, PTSD?) Signs/Symptoms: mild fat depletion, mild muscle depletion, moderate fat depletion, moderate muscle depletion, percent weight loss Percent weight loss: 20 % (within 2 months) Interventions: Ensure Enlive (each supplement provides 350kcal and 20 grams of protein), MVI, Refer to RD note for recommendations  Consultants: Psychiatry  Procedures: None  Antimicrobials: Rocephin IV- completed  Culture/Microbiology    Component Value Date/Time   SDES TRACHEAL ASPIRATE 04/24/2021 1919   SPECREQUEST NONE 04/24/2021 1919   CULT  04/24/2021 1919    RARE Normal respiratory flora-no Staph aureus or Pseudomonas seen Performed at [facility], [place] [address] [place],  [address]    REPTSTATUS 04/27/2021 FINAL 04/24/2021 1919    Data Reviewed: I have personally reviewed the following labs and imaging studies.    CBC: Recent Labs  Lab 04/26/21 0639 04/28/21  0330 04/29/21 1221 04/30/21 0221 04/30/21 1425 05/01/21 0251 05/02/21 0345  WBC 7.7 7.2  --  7.0  --  6.2 6.1  HGB 8.1* 7.7* 8.6* 7.4* 9.5* 8.0* 8.6*  HCT 27.5* 26.0* 29.9* 25.3* 32.5* 27.7* 29.5*  MCV 81.6 81.5  --  81.6  --  82.0 82.9  PLT 256 254  --  267  --  269 161    Basic Metabolic Panel: Recent Labs  Lab 04/26/21 0639 04/28/21 0330 04/30/21 0221 05/01/21 0251 05/02/21 0345  NA 135 137 136 139 136  K 4.2 3.8 3.9 4.2 4.3  CL 107 108 104 106 102  CO2 22 23 25 27 29   GLUCOSE 95 74 102* 101* 102*  BUN 6 <5* 5* 5* 6  CREATININE 0.79 0.71 0.75 0.77 0.75  CALCIUM 8.0* 8.2* 8.1* 8.4* 8.4*  MG 1.8  --   --   --  1.8  PHOS 2.9  --   --   --   --     GFR: Estimated  Creatinine Clearance: 144.5 mL/min (by C-G formula based on SCr of 0.75 mg/dL). Liver Function Tests: Recent Labs  Lab 04/26/21 0639  AST 20  ALT 19  ALKPHOS 130*  BILITOT 0.5  PROT 4.2*  ALBUMIN 1.7*    Recent Labs  Lab 04/25/21 1419  LIPASE 23    No results for input(s): AMMONIA in the last 168 hours. Coagulation Profile: No results for input(s): INR, PROTIME in the last 168 hours. Cardiac Enzymes: No results for input(s): CKTOTAL, CKMB, CKMBINDEX, TROPONINI in the last 168 hours. BNP (last 3 results) No results for input(s): PROBNP in the last 8760 hours. HbA1C: No results for input(s): HGBA1C in the last 72 hours. CBG: Recent Labs  Lab 05/01/21 1637 05/01/21 1917 05/01/21 2318 05/02/21 0308 05/02/21 0802  GLUCAP 67* 74 70 100* 72    Lipid Profile: No results for input(s): CHOL, HDL, LDLCALC, TRIG, CHOLHDL, LDLDIRECT in the last 72 hours. Thyroid Function Tests: No results for input(s): TSH, T4TOTAL, FREET4, T3FREE, THYROIDAB in the last 72 hours.  Anemia Panel: Recent Labs    04/29/21 1431 04/30/21 0221  FERRITIN  --  14*  IRON 23*  --     Sepsis Labs: No results for input(s): PROCALCITON, LATICACIDVEN in the last 168 hours.  Recent Results (from the past 240 hour(s))  Culture, blood (single)     Status: None   Collection Time: 04/24/21  3:26 PM   Specimen: BLOOD RIGHT HAND  Result Value Ref Range Status   Specimen Description BLOOD RIGHT HAND  Final   Special Requests   Final    BOTTLES DRAWN AEROBIC ONLY Blood Culture adequate volume   Culture   Final    NO GROWTH 5 DAYS Performed at [facility], [address], [place], [place] [address]    Report Status 04/29/2021 FINAL  Final  Blood culture (routine x 2)     Status: None   Collection Time: 04/24/21  3:26 PM   Specimen: BLOOD  Result Value Ref Range Status   Specimen Description BLOOD  Final   Special Requests NONE  Final   Culture   Final    NO GROWTH 5 DAYS Performed at [facility], [address] [place], [place] [address]    Report Status 04/29/2021 FINAL  Final  Blood culture (routine x 2)     Status: None   Collection Time: 04/24/21  3:26 PM   Specimen: BLOOD  Result Value Ref Range  Status   Specimen Description BLOOD  Final   Special Requests NONE  Final   Culture   Final    NO GROWTH 5 DAYS Performed at [facility], [address] [place], [place] [address]    Report Status 04/29/2021 FINAL  Final  MRSA Next Gen by PCR, Nasal     Status: None   Collection Time: 04/24/21  7:18 PM   Specimen: Nasal Mucosa; Nasal Swab  Result Value Ref Range Status   MRSA by PCR Next Gen NOT DETECTED NOT DETECTED Final    Comment: (NOTE) The GeneXpert MRSA Assay (FDA approved for NASAL specimens only), is one component of a comprehensive MRSA colonization surveillance program. It is not intended to diagnose MRSA infection nor to guide or monitor treatment for MRSA infections. Test performance is not FDA approved in patients less than 63 years old. Performed at [facility], [place] [address], [place], [place] [address]   Culture, Respiratory w Gram Stain     Status: None   Collection Time: 04/24/21  7:19 PM   Specimen: Tracheal Aspirate; Respiratory  Result Value Ref Range Status   Specimen Description TRACHEAL ASPIRATE  Final   Special Requests NONE  Final   Gram Stain   Final    RARE SQUAMOUS EPITHELIAL CELLS PRESENT ABUNDANT WBC PRESENT, PREDOMINANTLY PMN RARE GRAM POSITIVE COCCI    Culture   Final    RARE Normal respiratory flora-no Staph aureus or Pseudomonas seen Performed at [facility], [address] [place], [place] [address]    Report Status 04/27/2021 FINAL  Final  Resp Panel by RT-PCR (Flu A&B, Covid) Nasopharyngeal Swab     Status: None   Collection Time: 04/27/21  1:17 AM   Specimen: Nasopharyngeal Swab; Nasopharyngeal(NP) swabs in vial transport medium  Result Value Ref Range Status   SARS Coronavirus 2 by RT PCR NEGATIVE NEGATIVE  Final    Comment: (NOTE) SARS-CoV-2 target nucleic acids are NOT DETECTED.  The SARS-CoV-2 RNA is generally detectable in upper respiratory specimens during the acute phase of infection. The lowest concentration of SARS-CoV-2 viral copies this assay can detect is 138 copies/mL. A negative result does not preclude SARS-Cov-2 infection and should not be used as the sole basis for treatment or other patient management decisions. A negative result may occur with  improper specimen collection/handling, submission of specimen other than nasopharyngeal swab, presence of viral mutation(s) within the areas targeted by this assay, and inadequate number of viral copies(<138 copies/mL). A negative result must be combined with clinical observations, patient history, and epidemiological information. The expected result is Negative.  Fact Sheet for Patients:  EntrepreneurPulse.com.au  Fact Sheet for Healthcare Providers:  IncredibleEmployment.be  This test is no t yet approved or cleared by the [place] FDA and  has been authorized for detection and/or diagnosis of SARS-CoV-2 by FDA under an Emergency Use Authorization (EUA). This EUA will remain  in effect (meaning this test can be used) for the duration of the COVID-19 declaration under Section 564(b)(1) of the Act, 21 U.S.C.section 360bbb-3(b)(1), unless the authorization is terminated  or revoked sooner.       Influenza A by PCR NEGATIVE NEGATIVE Final   Influenza B by PCR NEGATIVE NEGATIVE Final    Comment: (NOTE) The Xpert Xpress SARS-CoV-2/FLU/RSV plus assay is intended as an aid in the diagnosis of influenza from Nasopharyngeal swab specimens and should not be used as a sole basis for treatment. Nasal washings and aspirates are unacceptable  for Xpert Xpress SARS-CoV-2/FLU/RSV testing.  Fact Sheet for Patients: EntrepreneurPulse.com.au  Fact Sheet for Healthcare  Providers: IncredibleEmployment.be  This test is not yet approved or cleared by the [place] FDA and has been authorized for detection and/or diagnosis of SARS-CoV-2 by FDA under an Emergency Use Authorization (EUA). This EUA will remain in effect (meaning this test can be used) for the duration of the COVID-19 declaration under Section 564(b)(1) of the Act, 21 U.S.C. section 360bbb-3(b)(1), unless the authorization is terminated or revoked.  Performed at [facility], [place] [address] [place], [place] [address]   Surgical pcr screen     Status: None   Collection Time: 04/29/21 10:26 PM   Specimen: Nasal Mucosa; Nasal Swab  Result Value Ref Range Status   MRSA, PCR NEGATIVE NEGATIVE Final   Staphylococcus aureus NEGATIVE NEGATIVE Final    Comment: (NOTE) The Xpert SA Assay (FDA approved for NASAL specimens in patients 42 years of age and older), is one component of a comprehensive surveillance program. It is not intended to diagnose infection nor to guide or monitor treatment. Performed at [facility], [place] [address], [place], [place] [address]       Radiology Studies: CT HEAD WO CONTRAST (5MM)  Result Date: 05/01/2021 CLINICAL DATA:  Facial trauma, fall. EXAM: CT HEAD WITHOUT CONTRAST TECHNIQUE: Contiguous axial images were obtained from the base of the skull through the vertex without intravenous contrast. COMPARISON:  Prior head CT examinations 04/24/2021 and earlier. Brain MRI 04/22/2019. FINDINGS: Brain: Mild generalized parenchymal atrophy, advanced for age. There is no acute intracranial hemorrhage. No demarcated cortical infarct. No extra-axial fluid collection. No evidence of an intracranial mass. No midline shift. Vascular: No hyperdense vessel. Skull: Normal. Negative for fracture or focal lesion. Sinuses/Orbits: Visualized orbits show no acute finding. No significant paranasal sinus disease at the imaged levels. IMPRESSION: No evidence  of acute intracranial abnormality. Mild generalized parenchymal atrophy, advanced for age. Electronically Signed   By: Kellie Simmering D.O.   On: 05/01/2021 15:27     LOS: 8 days   Flora Lipps, MD [facility] 05/02/2021, 10:15 AM

## 2021-05-03 ENCOUNTER — Encounter: Payer: Self-pay | Admitting: Gastroenterology

## 2021-05-03 LAB — GLIA (IGA/G) + TTG IGA
Antigliadin Abs, IgA: 7 units (ref 0–19)
Gliadin IgG: 7 units (ref 0–19)
Tissue Transglutaminase Ab, IgA: <2 U/mL (ref 0–3)

## 2021-05-03 LAB — CBC
HCT: 31.2 % — ABNORMAL LOW (ref 39.0–52.0)
Hemoglobin: 8.9 g/dL — ABNORMAL LOW (ref 13.0–17.0)
MCH: 23.6 pg — ABNORMAL LOW (ref 26.0–34.0)
MCHC: 28.5 g/dL — ABNORMAL LOW (ref 30.0–36.0)
MCV: 82.8 fL (ref 80.0–100.0)
Platelets: 313 10*3/uL (ref 150–400)
RBC: 3.77 MIL/uL — ABNORMAL LOW (ref 4.22–5.81)
RDW: 19 % — ABNORMAL HIGH (ref 11.5–15.5)
WBC: 9.5 10*3/uL (ref 4.0–10.5)
nRBC: 0 % (ref 0.0–0.2)

## 2021-05-03 LAB — COMPREHENSIVE METABOLIC PANEL
ALT: 13 U/L (ref 0–44)
AST: 14 U/L — ABNORMAL LOW (ref 15–41)
Albumin: 2.3 g/dL — ABNORMAL LOW (ref 3.5–5.0)
Alkaline Phosphatase: 106 U/L (ref 38–126)
Anion gap: 9 (ref 5–15)
BUN: 15 mg/dL (ref 6–20)
CO2: 27 mmol/L (ref 22–32)
Calcium: 8.8 mg/dL — ABNORMAL LOW (ref 8.9–10.3)
Chloride: 97 mmol/L — ABNORMAL LOW (ref 98–111)
Creatinine, Ser: 0.72 mg/dL (ref 0.61–1.24)
GFR, Estimated: >60 mL/min (ref >60)
Glucose, Bld: 78 mg/dL (ref 70–99)
Potassium: 4.7 mmol/L (ref 3.5–5.1)
Sodium: 133 mmol/L — ABNORMAL LOW (ref 135–145)
Total Bilirubin: 0.5 mg/dL (ref 0.3–1.2)
Total Protein: 5.4 g/dL — ABNORMAL LOW (ref 6.5–8.1)

## 2021-05-03 LAB — GLUCOSE, CAPILLARY
Glucose-Capillary: 129 mg/dL — ABNORMAL HIGH (ref 70–99)
Glucose-Capillary: 114 mg/dL — ABNORMAL HIGH (ref 70–99)
Glucose-Capillary: 120 mg/dL — ABNORMAL HIGH (ref 70–99)
Glucose-Capillary: 101 mg/dL — ABNORMAL HIGH (ref 70–99)

## 2021-05-03 LAB — OPIATES,MS,WB/SP RFX
6-Acetylmorphine: NEGATIVE
Codeine: NEGATIVE ng/mL
Dihydrocodeine: NEGATIVE ng/mL
Hydrocodone: NEGATIVE ng/mL
Hydromorphone: NEGATIVE ng/mL
Morphine: NEGATIVE ng/mL
Opiate Confirmation: NEGATIVE

## 2021-05-03 LAB — METHADONE,MS,WB/SP RFX
Methadone Confirmation: POSITIVE
Methadone: 148.9 ng/mL

## 2021-05-03 LAB — MAGNESIUM: Magnesium: 1.9 mg/dL (ref 1.7–2.4)

## 2021-05-03 MED ORDER — DOCUSATE SODIUM 100 MG PO CAPS: 100.0000 mg | ORAL_CAPSULE | Freq: Two times a day (BID) | ORAL | 0 refills | Status: DC

## 2021-05-03 MED ORDER — DIVALPROEX SODIUM 125 MG PO CSDR: 250.0000 mg | DELAYED_RELEASE_CAPSULE | Freq: Every day | ORAL | 2 refills | Status: DC

## 2021-05-03 MED ORDER — FERROUS SULFATE 325 (65 FE) MG PO TABS: 325.0000 mg | ORAL_TABLET | Freq: Every day | ORAL | 2 refills | Status: DC

## 2021-05-03 MED ORDER — LIDOCAINE 5 % EX PTCH: 1.0000 | MEDICATED_PATCH | CUTANEOUS | Status: DC

## 2021-05-03 MED ORDER — THIAMINE HCL 100 MG PO TABS: 100.0000 mg | ORAL_TABLET | Freq: Every day | ORAL | 0 refills | Status: DC

## 2021-05-03 MED ORDER — ADULT MULTIVITAMIN W/MINERALS CH: 1.0000 | ORAL_TABLET | Freq: Every day | ORAL | Status: DC

## 2021-05-03 MED ORDER — ACETAMINOPHEN 325 MG PO TABS: 650.0000 mg | ORAL_TABLET | ORAL | Status: DC | PRN

## 2021-05-03 MED ORDER — NICOTINE 21 MG/24HR TD PT24: 21.0000 mg | MEDICATED_PATCH | Freq: Every day | TRANSDERMAL | 0 refills | Status: DC

## 2021-05-03 MED ORDER — FOLIC ACID 1 MG PO TABS: 1.0000 mg | ORAL_TABLET | Freq: Every day | ORAL | Status: DC

## 2021-05-03 MED ORDER — PANTOPRAZOLE SODIUM 40 MG PO TBEC: DELAYED_RELEASE_TABLET | ORAL | 0 refills | Status: DC

## 2021-05-03 MED ORDER — PREGABALIN 200 MG PO CAPS
200.0000 mg | ORAL_CAPSULE | Freq: Three times a day (TID) | ORAL | 2 refills | Status: DC
Start: 2021-05-03 — End: 2022-05-30

## 2021-05-03 MED ORDER — PANTOPRAZOLE SODIUM 40 MG PO TBEC: DELAYED_RELEASE_TABLET | ORAL | Status: DC

## 2021-05-03 MED ORDER — ACETAMINOPHEN 325 MG PO TABS: 650.0000 mg | ORAL_TABLET | ORAL | 0 refills | Status: DC | PRN

## 2021-05-03 MED ORDER — FOLIC ACID 1 MG PO TABS
1.0000 mg | ORAL_TABLET | Freq: Every day | ORAL | 2 refills | Status: DC
Start: 2021-05-04 — End: 2021-07-12

## 2021-05-03 MED ORDER — FERROUS SULFATE 325 (65 FE) MG PO TABS: 325.0000 mg | ORAL_TABLET | Freq: Every day | ORAL | Status: DC

## 2021-05-03 MED ORDER — DIAZEPAM 5 MG PO TABS
10.0000 mg | ORAL_TABLET | Freq: Every day | ORAL | Status: DC
  Administered 2021-05-03: 10 mg via ORAL
  Filled 2021-05-03: qty 2

## 2021-05-03 MED ORDER — DIAZEPAM 5 MG PO TABS: 10.0000 mg | ORAL_TABLET | Freq: Every evening | ORAL | 0 refills | Status: DC

## 2021-05-03 MED ORDER — DIVALPROEX SODIUM 125 MG PO CSDR: 250.0000 mg | DELAYED_RELEASE_CAPSULE | Freq: Every day | ORAL | Status: DC

## 2021-05-03 MED ORDER — DIAZEPAM 5 MG PO TABS
5.0000 mg | ORAL_TABLET | Freq: Every day | ORAL | Status: DC
  Administered 2021-05-04: 5 mg via ORAL
  Filled 2021-05-03: qty 1

## 2021-05-03 MED ORDER — PROMETHAZINE HCL 25 MG RE SUPP
25.0000 mg | Freq: Four times a day (QID) | RECTAL | Status: DC | PRN
  Administered 2021-05-03: 25 mg via RECTAL
  Filled 2021-05-03 (×3): qty 1

## 2021-05-03 MED ORDER — DOCUSATE SODIUM 100 MG PO CAPS
100.0000 mg | ORAL_CAPSULE | Freq: Two times a day (BID) | ORAL | 2 refills | Status: AC
Start: 2021-05-03 — End: 2021-06-02

## 2021-05-03 MED ORDER — DIVALPROEX SODIUM 125 MG PO CSDR: 750.0000 mg | DELAYED_RELEASE_CAPSULE | Freq: Every evening | ORAL | Status: DC

## 2021-05-03 MED ORDER — PRAZOSIN HCL 1 MG PO CAPS: 1.0000 mg | ORAL_CAPSULE | Freq: Every day | ORAL | Status: DC

## 2021-05-03 MED ORDER — PREGABALIN 200 MG PO CAPS: 200.0000 mg | ORAL_CAPSULE | Freq: Three times a day (TID) | ORAL | 2 refills | Status: DC

## 2021-05-03 MED ORDER — LIDOCAINE 5 % EX PTCH: 1.0000 | MEDICATED_PATCH | CUTANEOUS | 0 refills | Status: DC

## 2021-05-03 MED ORDER — ENSURE ENLIVE PO LIQD
237.0000 mL | Freq: Four times a day (QID) | ORAL | Status: DC
Start: 2021-05-03 — End: 2021-05-04
  Administered 2021-05-03 (×3): 237 mL via ORAL

## 2021-05-03 MED ORDER — THIAMINE HCL 100 MG PO TABS: 100.0000 mg | ORAL_TABLET | Freq: Every day | ORAL | Status: DC

## 2021-05-03 MED ORDER — POLYETHYLENE GLYCOL 3350 17 GM/SCOOP PO POWD: 1.0000 | ORAL | 0 refills | Status: AC

## 2021-05-03 MED ORDER — NICOTINE 21 MG/24HR TD PT24: 21.0000 mg | MEDICATED_PATCH | Freq: Every day | TRANSDERMAL | Status: DC

## 2021-05-03 MED ORDER — ADULT MULTIVITAMIN W/MINERALS CH: 1.0000 | ORAL_TABLET | Freq: Every day | ORAL | 0 refills | Status: DC

## 2021-05-03 MED ORDER — ALBUTEROL SULFATE HFA 108 (90 BASE) MCG/ACT IN AERS: 1.0000 | INHALATION_SPRAY | Freq: Four times a day (QID) | RESPIRATORY_TRACT | 0 refills | Status: DC | PRN

## 2021-05-03 MED ORDER — DIAZEPAM 5 MG PO TABS: 5.0000 mg | ORAL_TABLET | Freq: Every morning | ORAL | 0 refills | Status: DC

## 2021-05-03 MED ORDER — ENSURE ENLIVE PO LIQD: 237.0000 mL | Freq: Four times a day (QID) | ORAL | Status: DC

## 2021-05-03 MED ORDER — DIVALPROEX SODIUM 125 MG PO CSDR: 750.0000 mg | DELAYED_RELEASE_CAPSULE | Freq: Every evening | ORAL | 2 refills | Status: DC

## 2021-05-03 MED ORDER — ENSURE ENLIVE PO LIQD: 237.0000 mL | Freq: Four times a day (QID) | ORAL | 0 refills | Status: AC

## 2021-05-03 MED ORDER — ORAL CARE MOUTH RINSE: 15.0000 mL | Freq: Three times a day (TID) | OROMUCOSAL | Status: DC | PRN

## 2021-05-03 MED ORDER — METOCLOPRAMIDE HCL 5 MG PO TABS
5.0000 mg | ORAL_TABLET | Freq: Three times a day (TID) | ORAL | 0 refills | Status: DC
Start: 2021-05-03 — End: 2021-07-12

## 2021-05-03 MED ORDER — PRAZOSIN HCL 1 MG PO CAPS: 1.0000 mg | ORAL_CAPSULE | Freq: Every day | ORAL | 2 refills | Status: DC

## 2021-05-03 NOTE — Plan of Care (Signed)

## 2021-05-03 NOTE — Progress Notes (Signed)
Nutrition Follow-up  DOCUMENTATION CODES:   Non-severe (moderate) malnutrition in context of chronic illness  INTERVENTION:  -Monitor for plan of care (RD sent secure chat to MD to discuss plans RE diet advancement vs nutrition support needs) -Ensure Enlive po QID, each supplement provides 350 kcal and 20 grams of protein -Continue MVI with minerals daily  NUTRITION DIAGNOSIS:   Moderate Malnutrition related to chronic illness (COPD, polysubstance abuse, PTSD?) as evidenced by mild fat depletion, mild muscle depletion, moderate fat depletion, moderate muscle depletion, percent weight loss.  ongoing  GOAL:   Patient will meet greater than or equal to 90% of their needs  progressing  MONITOR:   PO intake, Supplement acceptance, Labs, Weight trends, I & O's  REASON FOR ASSESSMENT:   Ventilator, Consult Enteral/tube feeding initiation and management  ASSESSMENT:   25 yo male admitted to [facility] with LE swelling/redness; he had a seizure, then developed hypoxia and required intubation. Transferred to [facility] 11/6. PMH includes PTSD, depression/anxiety, seizure disorder, microcytic anemia, polysubstance abuse, ADHD, COPD, scoliosis, tachycardia.  11/08 extubated 11/09 tx to [facility] 11/10 diet downgraded to dysphagia 3 with thin liquids  11/11 s/p EGD w/ esophageal dilation and biopsy 11/12 diet downgraded to full liquids  GI was consulted on 11/11 2/2 N/V and pt underwent endoscopic evaluation with esophageal dilation and biopsy. Since then, pt reports that intake has improved and pt now denies N/V. Unfortunately, no PO intake has been documented to confirm these reports. pt and RN also report good supplement consumption. Per MD, pt is now medically stable for discharge. CIR felt pt was inappropriate for admission. TOC following. RD reached out to MD to discuss current diet order and plans for diet advancement/potential for nutrition support given a full liquid diet will not allow pt to meet  his nutritional needs.   No UOP documented x24 hours I/O: +1.4L since admit  Admission weight: 71.5 kg Current weight: 74.5 kg  Non-pitting edema to BLE per RN edema assessment   Medications: Ensure Enlive/Plus TID, ferrous sulfate, folvite, mvi with minerals, protonix, thiamine, ferrlecit Labs: Na 133 (L) CBGs: 72-119 x 24 hours  Diet Order:   Diet Order             Diet full liquid Room service appropriate? Yes; Fluid consistency: Thin  Diet effective now                  EDUCATION NEEDS:   No education needs have been identified at this time  Skin:  Skin Assessment: Skin Integrity Issues: Skin Integrity Issues:: Other (Comment) Other: lesion on penis  Last BM:  11/14  Height:   Ht Readings from Last 1 Encounters:  04/30/21 5\' 10"  (1.778 m)    Weight:   Wt Readings from Last 1 Encounters:  05/03/21 74.5 kg    BMI:  Body mass index is 23.57 kg/m.  Estimated Nutritional Needs:   Kcal:  2200-2400  Protein:  110-120 grams  Fluid:  >/= 2 L     Vincent Volner A., MS, RD, LDN (she/her/hers) RD pager number and weekend/on-call pager number located in Amion.
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
## 2021-05-03 NOTE — Progress Notes (Deleted)
Physician Discharge Summary  [name] WHQ:[id number] DOB: [birth year] DOA: 04/24/2021  PCP: Waldon Reining, MD  Admit date: 04/24/2021 Discharge date: 05/03/2021  Admitted From: Home  Discharge disposition: Inpatient rehab at [facility]  Recommendations for Outpatient Follow-Up:   Check CBC, LFT magnesium in 7 to 10 days.   Follow-up with GI at [facility] in 4 weeks for repeat endoscopy and possible treatment.  (To be scheduled) Please continue the patient on soft diet until seen by GI as outpatient. Continue psychotropic medications as recommended on discharge.  Try to avoid narcotics except methadone.  Patient does have a strong narcotic seeking tendency. Patient will benefit from psychiatry follow-up as outpatient especially therapist.  Discharge Diagnosis:   Active Problems:   Microcytic anemia   Respiratory arrest (HCC)   Acute hypoxemic respiratory failure (HCC)   Malnutrition of moderate degree   Iron deficiency anemia   Esophageal dysphagia   Esophageal stricture   Gastritis and gastroduodenitis   Acute esophagitis  Discharge Condition: Improved.  Diet recommendation: Soft diet.  Wound care: None.  Code status: Full.  History of Present Illness:   Vincent Green, 25 y.o. male with past medical history of posttraumatic stress disorder, depression/anxiety, seizure disorder, microcytic anemia, polysubstance abuse presented to the hospital with breakthrough seizures.  Patient was intubated for concerns of aspiration pneumonia from seizures.  Patient was extubated on 04/26/2021.  Patient was also seen by psychiatry for possible bipolar disorder.  Medications have been adjusted.  Patient is on chronic methadone as per the patient's father from outpatient but had been losing weight for several months.  GI was consulted on 04/29/2021 for nausea vomiting and underwent endoscopic evaluation with findings of esophageal stenosis and underwent dilatation.  Hospital  Course:   Following conditions were addressed during hospitalization as listed below,  Acute hypoxemic respiratory failure with toxic encephalopathy in the setting of polypharmacy.   Patient was intubated and subsequently extubated on 04/26/2021.  Currently on room air.  Patient was seen by psychiatry and medications have been adjusted.   Seizure disorder: Patient was intubated and subsequently extubated 04/26/21.  At this time, mentation has improved.  Continue antiepileptic agents   Currently on diazepam, valproic acid, perampanel.  Patient will be on diazepam 10 mg at nighttime and 5 mg in the morning.  Sodium valproate 750 mg in the evening and 250 mg in the morning.   Aspiration pneumonitis:  Completed antibiotics with IV Rocephin for 5 days.  Currently on room air and stable   Bilateral lower extremity cellulitis with B/l Leg edema- Patient has completed antibiotic.  Transthoracic echocardiogram on 04/25/2021 showed LV ejection fraction of 55 to 60%.  Lower extremity ultrasound negative for DVT.  Likely secondary to hypoalbuminemia.  Would need to improve his nutrition.  Soft diet has been initiated at this time.   Chronic back pain, on methadone and Lyrica as outpatient.  Methadone and Lyrica after discharge.   Polysubstance abuse urine drug screen was positive for benzo, amphetamine and THC.  Tendency to seek for opiates.  Please try to avoid narcotics sedatives and hypnotics apart from prescribed.   PTSD/Anxiety/Depression-concern for bipolar disorder. Continue Depakote and perampanel and Valium as per psychiatry.  Patient was seen by psychiatry    patient will need follow-up with psychiatry as outpatient, most importantly therapist.  Acute on chronic microcytic anemia: Patient presented with hemoglobin of 6.0 on presentation.  Status post 2 units of packed RBC.  Patient underwent endoscopic evaluation with esophageal dilation.  Patient  received IV iron in the hospital and has been  started on oral iron supplements.  GI has recommended repeat endoscopic evaluation in 4 weeks for possible retreatment.  Hemoglobin of 8.8 and has remained stable.  Dysphagia/nausea vomiting/20% weight loss in past 2 months:.  Moderate protein calorie malnutrition.  Seen by dietitian.  We will continue Ensure supplement. Was mostly eating noodles at home.  GI was consulted for vomiting, nausea and underwent esophageal dilation and biopsy.  Patient was noted to have a LA grade D esophagitis with no bleeding erythematous duodenopathy, esophageal stenosis which was dilated.  Continue Reglan, PPI on discharge.  Patient will need GI follow-up in 4 weeks.  Please encourage oral nutrition.  Patient is supposed to be on soft diet until seen by GI.Marland Kitchen   Hypoglycemia  During hospitalization.  Has improved.  Will need to be encouraged on oral intake.   Painful penile lesion: Will need outpatient follow-up.  Local dressing.  Patient's father knows about it.   Deconditioning/debility/generalized weakness  Physical therapy has recommended CIR and patient does have a place for rehabilitation today..  Disposition.  At this time, patient is stable for disposition to CIR.  Medical Consultants:   Psychiatry GI Critical care Neurology  Procedures:    Upper GI endoscopy with esophageal dilation on 04/30/2021 Intubation and extubation.  Subjective:   Today, patient was seen and examined at bedside.  Denies nausea vomiting or abdominal pain.  Complains of chronic leg and back pain.  Discharge Exam:   Vitals:   05/03/21 0839 05/03/21 1215  BP: (!) 94/58 101/77  Pulse: 98 (!) 119  Resp: 20   Temp: 99.3 F (37.4 C) 98.6 F (37 C)  SpO2: 98% 97%   Vitals:   05/03/21 0436 05/03/21 0441 05/03/21 0839 05/03/21 1215  BP: (!) 101/52  (!) 94/58 101/77  Pulse: 62  98 (!) 119  Resp: 18  20   Temp: 98.6 F (37 C)  99.3 F (37.4 C) 98.6 F (37 C)  TempSrc: Oral  Oral Oral  SpO2: 90%  98% 97%  Weight:   74.5 kg    Height:       General: Alert awake, not in obvious distress HENT: pupils equally reacting to light,  No scleral pallor or icterus noted. Oral mucosa is moist.  Chest:  Clear breath sounds. No crackles or wheezes.  CVS: S1 &S2 heard. No murmur.  Regular rate and rhythm. Abdomen: Soft, nontender, nondistended.  Bowel sounds are heard.   Extremities: No cyanosis, clubbing or edema.  Peripheral pulses are palpable. Psych: Alert, awake and oriented,  CNS:  No cranial nerve deficits.  Moves all extremities. Skin: Warm and dry.  No rashes noted.  The results of significant diagnostics from this hospitalization (including imaging, microbiology, ancillary and laboratory) are listed below for reference.     Diagnostic Studies:   CT Head Wo Contrast  Result Date: 04/24/2021 CLINICAL DATA:  Rule out cerebral hemorrhage EXAM: CT HEAD WITHOUT CONTRAST TECHNIQUE: Contiguous axial images were obtained from the base of the skull through the vertex without intravenous contrast. COMPARISON:  CT head 04/24/2020 FINDINGS: Brain: No evidence of acute infarction, hemorrhage, hydrocephalus, extra-axial collection or mass lesion/mass effect. Vascular: Negative for hyperdense vessel Skull: Negative Sinuses/Orbits: Negative Other: None IMPRESSION: Negative CT head Electronically Signed   By: Marlan Palau M.D.   On: 04/24/2021 14:46   DG Chest Port 1 View  Result Date: 04/24/2021 CLINICAL DATA:  COPD. EXAM: PORTABLE CHEST 1 VIEW COMPARISON:  Chest radiograph dated 04/24/2021. FINDINGS: Endotracheal tube with tip approximately 2 cm above the carina. Enteric tube extends below the diaphragm with tip beyond the inferior margin of the image. Bilateral lower lobe airspace opacities progressed since the prior radiograph. Small bilateral pleural effusions may be present. No pneumothorax. Stable cardiomediastinal silhouette. No acute osseous pathology. IMPRESSION: 1. Endotracheal tube with tip above the carina. 2.  Bilateral lower lobe airspace opacities progressed since the prior radiograph. Electronically Signed   By: Elgie Collard M.D.   On: 04/24/2021 19:57   DG Chest Port 1 View  Result Date: 04/24/2021 CLINICAL DATA:  Post intubation. EXAM: PORTABLE CHEST 1 VIEW COMPARISON:  February 23, 2019 FINDINGS: Post intubation with endotracheal tube terminating 1.3 cm above the carina. Slight retraction may be considered. Enteric catheter terminates in the region of the gastric antrum. Cardiomediastinal silhouette is normal. Mediastinal contours appear intact. Streaky and patchy airspace opacities with lower lobe predominance are present in both lungs. Osseous structures are without acute abnormality. Soft tissues are grossly normal. IMPRESSION: 1. Post intubation with endotracheal tube terminating 1.3 cm above the carina. Slight retraction may be considered. 2. Streaky and patchy airspace opacities with lower lobe predominance in both lungs. 3. No evidence of pneumothorax. Electronically Signed   By: Ted Mcalpine M.D.   On: 04/24/2021 15:20   EEG adult  Result Date: 04/25/2021 Charlsie Quest, MD     04/25/2021  9:01 PM Patient Name: Ac Colan MRN: [id number] Epilepsy Attending: Charlsie Quest Referring Physician/Provider: Dr Briant Sites Date: 04/25/2021 Duration: 24.40 mins Patient history: 25yo M with h/o epilepsy presented with breakthrough seizure. EEG to evaluate for seizure Level of alertness: Awake AEDs during EEG study: perampanel, VPA, Ativan Technical aspects: This EEG study was done with scalp electrodes positioned according to the 10-20 International system of electrode placement. Electrical activity was acquired at a sampling rate of  and reviewed with a high frequency filter of  and a low frequency filter of . EEG data were recorded continuously and digitally stored. Description: The posterior dominant rhythm consists of 9-10 Hz activity of moderate voltage (25-35 uV) seen  predominantly in posterior head regions, symmetric and reactive to eye opening and eye closing. IMPRESSION: This study is within normal limits. No seizures or epileptiform discharges were seen throughout the recording. Charlsie Quest   ECHOCARDIOGRAM COMPLETE  Result Date: 04/25/2021    ECHOCARDIOGRAM REPORT   Patient Name:   EVELYN MOCH Date of Exam: 04/25/2021 Medical Rec #:  [id number]       Height:       70.0 in Accession #:    [id number]      Weight:       157.6 lb Date of Birth:  1995/08/22       BSA:          1.886 m Patient Age:    25 years        BP:           96/57 mmHg Patient Gender: M               HR:           74 bpm. Exam Location:  Inpatient Procedure: 2D Echo, Cardiac Doppler, Color Doppler and Intracardiac            Opacification Agent Indications:    Murmur  History:        Patient has no prior history of Echocardiogram examinations.  Signs/Symptoms:Edema, Resp. failure, Polysubstance abuse and                 Murmur.  Sonographer:    Lavenia Atlas RDCS Referring Phys: [phone] PAULA B SIMPSON IMPRESSIONS  1. Left ventricular ejection fraction, by estimation, is 55 to 60%. The left ventricle has normal function. The left ventricle has no regional wall motion abnormalities. Left ventricular diastolic parameters were normal.  2. Right ventricular systolic function is normal. The right ventricular size is normal. There is normal pulmonary artery systolic pressure.  3. The mitral valve is normal in structure. Trivial mitral valve regurgitation. No evidence of mitral stenosis.  4. The aortic valve is tricuspid. Aortic valve regurgitation is not visualized. No aortic stenosis is present.  5. The inferior vena cava is normal in size with greater than 50% respiratory variability, suggesting right atrial pressure of 3 mmHg. FINDINGS  Left Ventricle: Left ventricular ejection fraction, by estimation, is 55 to 60%. The left ventricle has normal function. The left ventricle has no  regional wall motion abnormalities. Definity contrast agent was given IV to delineate the left ventricular  endocardial borders. The left ventricular internal cavity size was normal in size. There is no left ventricular hypertrophy. Left ventricular diastolic parameters were normal. Right Ventricle: The right ventricular size is normal. No increase in right ventricular wall thickness. Right ventricular systolic function is normal. There is normal pulmonary artery systolic pressure. The tricuspid regurgitant velocity is 2.21 m/s, and  with an assumed right atrial pressure of 3 mmHg, the estimated right ventricular systolic pressure is 22.5 mmHg. Left Atrium: Left atrial size was normal in size. Right Atrium: Right atrial size was normal in size. Pericardium: There is no evidence of pericardial effusion. Mitral Valve: The mitral valve is normal in structure. Trivial mitral valve regurgitation. No evidence of mitral valve stenosis. Tricuspid Valve: The tricuspid valve is normal in structure. Tricuspid valve regurgitation is trivial. No evidence of tricuspid stenosis. Aortic Valve: The aortic valve is tricuspid. Aortic valve regurgitation is not visualized. No aortic stenosis is present. Pulmonic Valve: The pulmonic valve was normal in structure. Pulmonic valve regurgitation is not visualized. No evidence of pulmonic stenosis. Aorta: The aortic root is normal in size and structure. Venous: The inferior vena cava is normal in size with greater than 50% respiratory variability, suggesting right atrial pressure of 3 mmHg. IAS/Shunts: No atrial level shunt detected by color flow Doppler.  LEFT VENTRICLE PLAX 2D LVIDd:         3.40 cm   Diastology LVIDs:         2.34 cm   LV e' medial:    12.10 cm/s LV PW:         1.00 cm   LV E/e' medial:  6.1 LV IVS:        1.12 cm   LV e' lateral:   12.60 cm/s LVOT diam:     2.30 cm   LV E/e' lateral: 5.8 LV SV:         76 LV SV Index:   40 LVOT Area:     4.15 cm  RIGHT VENTRICLE RV  Basal diam:  4.43 cm RV S prime:     8.92 cm/s TAPSE (M-mode): 1.6 cm LEFT ATRIUM           Index LA diam:      3.40 cm 1.80 cm/m LA Vol (A4C): 34.8 ml 18.45 ml/m  AORTIC VALVE LVOT Vmax:   92.00 cm/s LVOT Vmean:  65.300 cm/s LVOT VTI:    0.183 m  AORTA Ao Root diam: 2.50 cm MITRAL VALVE               TRICUSPID VALVE MV Area (PHT): 4.10 cm    TR Peak grad:   19.5 mmHg MV Decel Time: 185 msec    TR Vmax:        221.00 cm/s MV E velocity: 73.70 cm/s MV A velocity: 55.30 cm/s  SHUNTS MV E/A ratio:  1.33        Systemic VTI:  0.18 m                            Systemic Diam: 2.30 cm Charlton Haws MD Electronically signed by Charlton Haws MD Signature Date/Time: 04/25/2021/10:39:56 AM    Final      Labs:   Basic Metabolic Panel: Recent Labs  Lab 04/28/21 0330 04/30/21 0221 05/01/21 0251 05/02/21 0345 05/03/21 0113  NA 137 136 139 136 133*  K 3.8 3.9 4.2 4.3 4.7  CL 108 104 106 102 97*  CO2 23 25 27 29 27   GLUCOSE 74 102* 101* 102* 78  BUN <5* 5* 5* 6 15  CREATININE 0.71 0.75 0.77 0.75 0.72  CALCIUM 8.2* 8.1* 8.4* 8.4* 8.8*  MG  --   --   --  1.8 1.9   GFR Estimated Creatinine Clearance: 144.5 mL/min (by C-G formula based on SCr of 0.72 mg/dL). Liver Function Tests: Recent Labs  Lab 05/03/21 0113  AST 14*  ALT 13  ALKPHOS 106  BILITOT 0.5  PROT 5.4*  ALBUMIN 2.3*   No results for input(s): LIPASE, AMYLASE in the last 168 hours. No results for input(s): AMMONIA in the last 168 hours. Coagulation profile No results for input(s): INR, PROTIME in the last 168 hours.  CBC: Recent Labs  Lab 04/28/21 0330 04/29/21 1221 04/30/21 0221 04/30/21 1425 05/01/21 0251 05/02/21 0345 05/03/21 0113  WBC 7.2  --  7.0  --  6.2 6.1 9.5  HGB 7.7*   < > 7.4* 9.5* 8.0* 8.6* 8.9*  HCT 26.0*   < > 25.3* 32.5* 27.7* 29.5* 31.2*  MCV 81.5  --  81.6  --  82.0 82.9 82.8  PLT 254  --  267  --  269 264 313   < > = values in this interval not displayed.   Cardiac Enzymes: No results for input(s):  CKTOTAL, CKMB, CKMBINDEX, TROPONINI in the last 168 hours. BNP: Invalid input(s): POCBNP CBG: Recent Labs  Lab 05/02/21 1240 05/02/21 1551 05/02/21 2117 05/03/21 0615 05/03/21 1216  GLUCAP 119* 103* 100* 129* 114*   D-Dimer No results for input(s): DDIMER in the last 72 hours. Hgb A1c No results for input(s): HGBA1C in the last 72 hours. Lipid Profile No results for input(s): CHOL, HDL, LDLCALC, TRIG, CHOLHDL, LDLDIRECT in the last 72 hours. Thyroid function studies No results for input(s): TSH, T4TOTAL, T3FREE, THYROIDAB in the last 72 hours.  Invalid input(s): FREET3 Anemia work up No results for input(s): VITAMINB12, FOLATE, FERRITIN, TIBC, IRON, RETICCTPCT in the last 72 hours. Microbiology Recent Results (from the past 240 hour(s))  Culture, blood (single)     Status: None   Collection Time: 04/24/21  3:26 PM   Specimen: BLOOD RIGHT HAND  Result Value Ref Range Status   Specimen Description BLOOD RIGHT HAND  Final   Special Requests   Final    BOTTLES DRAWN AEROBIC ONLY Blood Culture  adequate volume   Culture   Final    NO GROWTH 5 DAYS Performed at [facility], [address] [place], [place] [address]    Report Status 04/29/2021 FINAL  Final  Blood culture (routine x 2)     Status: None   Collection Time: 04/24/21  3:26 PM   Specimen: BLOOD  Result Value Ref Range Status   Specimen Description BLOOD  Final   Special Requests NONE  Final   Culture   Final    NO GROWTH 5 DAYS Performed at [facility], [address], [place], [place] [address]    Report Status 04/29/2021 FINAL  Final  Blood culture (routine x 2)     Status: None   Collection Time: 04/24/21  3:26 PM   Specimen: BLOOD  Result Value Ref Range Status   Specimen Description BLOOD  Final   Special Requests NONE  Final   Culture   Final    NO GROWTH 5 DAYS Performed at [facility], [address] [place], [place] [address]    Report Status 04/29/2021 FINAL  Final  MRSA Next Gen by PCR,  Nasal     Status: None   Collection Time: 04/24/21  7:18 PM   Specimen: Nasal Mucosa; Nasal Swab  Result Value Ref Range Status   MRSA by PCR Next Gen NOT DETECTED NOT DETECTED Final    Comment: (NOTE) The GeneXpert MRSA Assay (FDA approved for NASAL specimens only), is one component of a comprehensive MRSA colonization surveillance program. It is not intended to diagnose MRSA infection nor to guide or monitor treatment for MRSA infections. Test performance is not FDA approved in patients less than 76 years old. Performed at [facility], [address], [place], [place] [address]   Culture, Respiratory w Gram Stain     Status: None   Collection Time: 04/24/21  7:19 PM   Specimen: Tracheal Aspirate; Respiratory  Result Value Ref Range Status   Specimen Description TRACHEAL ASPIRATE  Final   Special Requests NONE  Final   Gram Stain   Final    RARE SQUAMOUS EPITHELIAL CELLS PRESENT ABUNDANT WBC PRESENT, PREDOMINANTLY PMN RARE GRAM POSITIVE COCCI    Culture   Final    RARE Normal respiratory flora-no Staph aureus or Pseudomonas seen Performed at [facility], [address] [place], [place] [address]    Report Status 04/27/2021 FINAL  Final  Resp Panel by RT-PCR (Flu A&B, Covid) Nasopharyngeal Swab     Status: None   Collection Time: 04/27/21  1:17 AM   Specimen: Nasopharyngeal Swab; Nasopharyngeal(NP) swabs in vial transport medium  Result Value Ref Range Status   SARS Coronavirus 2 by RT PCR NEGATIVE NEGATIVE Final    Comment: (NOTE) SARS-CoV-2 target nucleic acids are NOT DETECTED.  The SARS-CoV-2 RNA is generally detectable in upper respiratory specimens during the acute phase of infection. The lowest concentration of SARS-CoV-2 viral copies this assay can detect is 138 copies/mL. A negative result does not preclude SARS-Cov-2 infection and should not be used as the sole basis for treatment or other patient management decisions. A negative result may occur  with  improper specimen collection/handling, submission of specimen other than nasopharyngeal swab, presence of viral mutation(s) within the areas targeted by this assay, and inadequate number of viral copies(<138 copies/mL). A negative result must be combined with clinical observations, patient history, and epidemiological information. The expected result is Negative.  Fact Sheet for Patients:  BloggerCourse.com  Fact  Sheet for Healthcare Providers:  SeriousBroker.it  This test is no t yet approved or cleared by the [place] FDA and  has been authorized for detection and/or diagnosis of SARS-CoV-2 by FDA under an Emergency Use Authorization (EUA). This EUA will remain  in effect (meaning this test can be used) for the duration of the COVID-19 declaration under Section 564(b)(1) of the Act, 21 U.S.C.section 360bbb-3(b)(1), unless the authorization is terminated  or revoked sooner.       Influenza A by PCR NEGATIVE NEGATIVE Final   Influenza B by PCR NEGATIVE NEGATIVE Final    Comment: (NOTE) The Xpert Xpress SARS-CoV-2/FLU/RSV plus assay is intended as an aid in the diagnosis of influenza from Nasopharyngeal swab specimens and should not be used as a sole basis for treatment. Nasal washings and aspirates are unacceptable for Xpert Xpress SARS-CoV-2/FLU/RSV testing.  Fact Sheet for Patients: BloggerCourse.com  Fact Sheet for Healthcare Providers: SeriousBroker.it  This test is not yet approved or cleared by the [place] FDA and has been authorized for detection and/or diagnosis of SARS-CoV-2 by FDA under an Emergency Use Authorization (EUA). This EUA will remain in effect (meaning this test can be used) for the duration of the COVID-19 declaration under Section 564(b)(1) of the Act, 21 U.S.C. section 360bbb-3(b)(1), unless the authorization is terminated  or revoked.  Performed at [facility], [address], [place], [place] [address]   Surgical pcr screen     Status: None   Collection Time: 04/29/21 10:26 PM   Specimen: Nasal Mucosa; Nasal Swab  Result Value Ref Range Status   MRSA, PCR NEGATIVE NEGATIVE Final   Staphylococcus aureus NEGATIVE NEGATIVE Final    Comment: (NOTE) The Xpert SA Assay (FDA approved for NASAL specimens in patients 16 years of age and older), is one component of a comprehensive surveillance program. It is not intended to diagnose infection nor to guide or monitor treatment. Performed at [facility], [address], [place], [place] [address]      Discharge Instructions:   Discharge Instructions     Diet - low sodium heart healthy   Complete by: As directed    SOFT diet, do not advance until seen by GI   Discharge instructions   Complete by: As directed    Patient will need to follow-up with GI as outpatient.  Continue soft diet only until seen by GI on the next visit.  Patient will need to follow-up with [facility] as outpatient (need to be scheduled).  Would need repeat endoscopy in 3 to 4 weeks for retreatment.   Discharge wound care:   Complete by: As directed    Cover penile lesion with xeroform gauze, top with ABD pad in mesh underwear to protect.   Increase activity slowly   Complete by: As directed       Allergies as of 05/03/2021       Reactions   Tessalon [benzonatate] Swelling   Throat swelling        Medication List     STOP taking these medications    hydrOXYzine 25 MG tablet Commonly known as: ATARAX/VISTARIL   linaclotide 290 MCG Caps capsule Commonly known as: Linzess   lisdexamfetamine 60 MG capsule Commonly known as: VYVANSE   LORazepam 1 MG tablet Commonly known as: ATIVAN   naproxen 500 MG tablet Commonly known as: NAPROSYN   promethazine 25 MG tablet Commonly known as: PHENERGAN       TAKE these medications  acetaminophen 325  MG tablet Commonly known as: TYLENOL Take 2 tablets (650 mg total) by mouth every 4 (four) hours as needed for mild pain, fever or headache.   albuterol 108 (90 Base) MCG/ACT inhaler Commonly known as: VENTOLIN HFA Inhale 1-2 puffs into the lungs every 6 (six) hours as needed for wheezing or shortness of breath.   diazepam 5 MG tablet Commonly known as: VALIUM Take 1 tablet (5 mg total) by mouth every morning. What changed:  when to take this reasons to take this   diazepam 5 MG tablet Commonly known as: Valium Take 2 tablets (10 mg total) by mouth every evening. What changed: You were already taking a medication with the same name, and this prescription was added. Make sure you understand how and when to take each.   divalproex 125 MG capsule Commonly known as: DEPAKOTE SPRINKLE Take 2 capsules (250 mg total) by mouth daily after breakfast. What changed: when to take this   divalproex 125 MG capsule Commonly known as: DEPAKOTE SPRINKLE Take 6 capsules (750 mg total) by mouth every evening. What changed: You were already taking a medication with the same name, and this prescription was added. Make sure you understand how and when to take each.   docusate sodium 100 MG capsule Commonly known as: COLACE Take 1 capsule (100 mg total) by mouth 2 (two) times daily.   famotidine 20 MG tablet Commonly known as: PEPCID Take 1 tablet (20 mg total) by mouth 2 (two) times daily.   feeding supplement Liqd Take 237 mLs by mouth 4 (four) times daily.   ferrous sulfate 325 (65 FE) MG tablet Take 1 tablet (325 mg total) by mouth daily with breakfast. Start taking on: May 04, 2021   folic acid 1 MG tablet Commonly known as: FOLVITE Take 1 tablet (1 mg total) by mouth daily. Start taking on: May 04, 2021   lidocaine 5 % Commonly known as: LIDODERM Place 1 patch onto the skin daily. Remove & Discard patch within 12 hours or as directed by MD   methadone 10 MG  tablet Commonly known as: DOLOPHINE Take 90 mg by mouth daily. At [facility]   metoCLOPramide 5 MG tablet Commonly known as: REGLAN Take 5 mg by mouth 3 (three) times daily before meals.   mouth rinse Liqd solution 15 mLs by Mouth Rinse route 3 (three) times daily as needed (as needed).   multivitamin with minerals Tabs tablet Take 1 tablet by mouth daily. Start taking on: May 04, 2021   nicotine 21 mg/24hr patch Commonly known as: NICODERM CQ - dosed in mg/24 hours Place 1 patch (21 mg total) onto the skin daily. Start taking on: May 04, 2021   pantoprazole 40 MG tablet Commonly known as: PROTONIX Take 1 tablet (40 mg total) by mouth 2 (two) times daily before a meal for 60 days, THEN 1 tablet (40 mg total) daily. Start taking on: May 03, 2021   perampanel 2 MG tablet Commonly known as: FYCOMPA Take 4 mg by mouth daily.   polyethylene glycol powder 17 GM/SCOOP powder Commonly known as: GLYCOLAX/MIRALAX See admin instructions.   prazosin 1 MG capsule Commonly known as: MINIPRESS Take 1 capsule (1 mg total) by mouth at bedtime.   pregabalin 200 MG capsule Commonly known as: LYRICA Take 200 mg by mouth 3 (three) times daily.   thiamine 100 MG tablet Take 1 tablet (100 mg total) by mouth daily. Start taking on: May 04, 2021  Discharge Care Instructions  (From admission, onward)           Start     Ordered   05/03/21 0000  Discharge wound care:       Comments: Cover penile lesion with xeroform gauze, top with ABD pad in mesh underwear to protect.   05/03/21 1218            Follow-up Information     Services, [facility] Follow up.   Why: Please contact this agency for mental health therapy and medication management services. Contact information: [address] [place] [address] [phone]         [facility]. Schedule an appointment as soon as possible for a  visit in 4 week(s).   Why: for repeat endoscopy Contact information: [address] [place] [address] [phone]                 Time coordinating discharge: 39 minutes  Signed:  Brenda Samano  [facility] 05/03/2021, 12:19 PM

## 2021-05-03 NOTE — Discharge Summary (Signed)
Physician Discharge Summary  Vincent Green ZOX:[id number] DOB: 1995-10-05 DOA: 04/24/2021  PCP: Waldon Reining, MD  Admit date: 04/24/2021 Discharge date: 05/04/2021  Admitted From: Home  Discharge disposition:  Home  Recommendations for Outpatient Follow-Up:   Follow-up with your primary care provider in 1 week.  Check CBC, LFT magnesium in 7 to 10 days.   Follow-up with GI at [facility] in 4 weeks for repeat endoscopy and possible treatment.  (To be scheduled) Please continue the patient on soft diet until seen by GI as outpatient. Continue psychotropic medications as recommended on discharge.  Try to avoid narcotics except methadone.  Patient does have a strong narcotic seeking tendency. Patient will benefit from psychiatry follow-up as outpatient especially therapist.  Discharge Diagnosis:   Active Problems:   Microcytic anemia   Respiratory arrest (HCC)   Acute hypoxemic respiratory failure (HCC)   Malnutrition of moderate degree   Iron deficiency anemia   Esophageal dysphagia   Esophageal stricture   Gastritis and gastroduodenitis   Acute esophagitis  Discharge Condition: Improved.  Diet recommendation: Soft diet.  Wound care: None.  Code status: Full.  History of Present Illness:   Vincent Green, 25 y.o. male with past medical history of posttraumatic stress disorder, depression/anxiety, seizure disorder, microcytic anemia, polysubstance abuse presented to the hospital with breakthrough seizures.  Patient was intubated for concerns of aspiration pneumonia from seizures.  Patient was extubated on 04/26/2021.  Patient was also seen by psychiatry for possible bipolar disorder.  Medications have been adjusted.  Patient is on chronic methadone as per the patient's father from outpatient but had been losing weight for several months.  GI was consulted on 04/29/2021 for nausea vomiting and underwent endoscopic evaluation with findings of esophageal stenosis and  underwent dilatation.  Hospital Course:   Following conditions were addressed during hospitalization as listed below,  Acute hypoxemic respiratory failure with toxic encephalopathy in the setting of polypharmacy.   Patient was intubated and subsequently extubated on 04/26/2021.  Currently on room air.  Patient was seen by psychiatry and medications have been adjusted.   Seizure disorder: Patient was intubated and subsequently extubated 04/26/21.  At this time, mentation has improved.  Continue antiepileptic agents   Currently on diazepam, valproic acid, perampanel.  Patient will be on diazepam 10 mg at nighttime and 5 mg in the morning.  Sodium valproate 750 mg in the evening and 250 mg in the morning.   Aspiration pneumonitis:  Completed antibiotics with IV Rocephin for 5 days.  Currently on room air and stable   Bilateral lower extremity cellulitis with B/l Leg edema- Patient has completed antibiotic.  Transthoracic echocardiogram on 04/25/2021 showed LV ejection fraction of 55 to 60%.  Lower extremity ultrasound negative for DVT.  Likely secondary to hypoalbuminemia.  Would need to improve his nutrition.  Soft diet has been initiated at this time.   Chronic back pain, on methadone and Lyrica as outpatient.  Methadone and Lyrica after discharge.   Polysubstance abuse urine drug screen was positive for benzo, amphetamine and THC.  Tendency to seek for opiates.  Please try to avoid narcotics sedatives and hypnotics apart from prescribed.   PTSD/Anxiety/Depression-concern for bipolar disorder. Continue Depakote and perampanel and Valium as per psychiatry.  Patient was seen by psychiatry    patient will need follow-up with psychiatry as outpatient, most importantly therapist.  Acute on chronic microcytic anemia: Patient presented with hemoglobin of 6.0 on presentation.  Status post 2 units of packed RBC.  Patient underwent  endoscopic evaluation with esophageal dilation.  Patient received IV iron  in the hospital and has been started on oral iron supplements.  GI has recommended repeat endoscopic evaluation in 4 weeks for possible retreatment.  Hemoglobin of 8.8 and has remained stable.  Dysphagia/nausea vomiting/20% weight loss in past 2 months:.  Moderate protein calorie malnutrition.  Seen by dietitian.  We will continue Ensure supplement. Was mostly eating noodles at home.  GI was consulted for vomiting, nausea and underwent esophageal dilation and biopsy.  Patient was noted to have a LA grade D esophagitis with no bleeding erythematous duodenopathy, esophageal stenosis which was dilated.  Continue Reglan, PPI on discharge.  Patient will need GI follow-up in 4 weeks.  Please encourage oral nutrition.  Patient is supposed to be on soft diet until seen by GI.Marland Kitchen   Hypoglycemia  During hospitalization.  Has improved.  Will need to be encouraged on oral intake.   Painful penile lesion: Will need outpatient follow-up.  Local dressing.  Patient's father knows about it.   Deconditioning/debility/generalized weakness  Physical therapy has recommended CIR and patient does have a place for rehabilitation today but patient has since changed mind and wishes to go home at this time..  Disposition.  At this time, patient is stable for disposition home.  Medical Consultants:   Psychiatry GI Critical care Neurology  Procedures:    Upper GI endoscopy with esophageal dilation on 04/30/2021 Intubation and extubation.  Subjective:   Today, patient was seen and examined at bedside.  Denies nausea vomiting or abdominal pain.  Complains of chronic leg and back pain.  Discharge Exam:   Vitals:   05/03/21 0839 05/03/21 1215  BP: (!) 94/58 101/77  Pulse: 98 (!) 119  Resp: 20 17  Temp: 99.3 F (37.4 C) 98.6 F (37 C)  SpO2: 98% 97%   Vitals:   05/03/21 0436 05/03/21 0441 05/03/21 0839 05/03/21 1215  BP: (!) 101/52  (!) 94/58 101/77  Pulse: 62  98 (!) 119  Resp: 18  20 17   Temp: 98.6 F  (37 C)  99.3 F (37.4 C) 98.6 F (37 C)  TempSrc: Oral  Oral Oral  SpO2: 90%  98% 97%  Weight:  74.5 kg    Height:       General: Alert awake, not in obvious distress HENT: pupils equally reacting to light,  No scleral pallor or icterus noted. Oral mucosa is moist.  Chest:  Clear breath sounds. No crackles or wheezes.  CVS: S1 &S2 heard. No murmur.  Regular rate and rhythm. Abdomen: Soft, nontender, nondistended.  Bowel sounds are heard.   Extremities: No cyanosis, clubbing or edema.  Peripheral pulses are palpable. Psych: Alert, awake and oriented,  CNS:  No cranial nerve deficits.  Moves all extremities. Skin: Warm and dry.  No rashes noted.  The results of significant diagnostics from this hospitalization (including imaging, microbiology, ancillary and laboratory) are listed below for reference.     Diagnostic Studies:   CT Head Wo Contrast  Result Date: 04/24/2021 CLINICAL DATA:  Rule out cerebral hemorrhage EXAM: CT HEAD WITHOUT CONTRAST TECHNIQUE: Contiguous axial images were obtained from the base of the skull through the vertex without intravenous contrast. COMPARISON:  CT head 04/24/2020 FINDINGS: Brain: No evidence of acute infarction, hemorrhage, hydrocephalus, extra-axial collection or mass lesion/mass effect. Vascular: Negative for hyperdense vessel Skull: Negative Sinuses/Orbits: Negative Other: None IMPRESSION: Negative CT head Electronically Signed   By: Marlan Palau M.D.   On: 04/24/2021 14:46  DG Chest Port 1 View  Result Date: 04/24/2021 CLINICAL DATA:  COPD. EXAM: PORTABLE CHEST 1 VIEW COMPARISON:  Chest radiograph dated 04/24/2021. FINDINGS: Endotracheal tube with tip approximately 2 cm above the carina. Enteric tube extends below the diaphragm with tip beyond the inferior margin of the image. Bilateral lower lobe airspace opacities progressed since the prior radiograph. Small bilateral pleural effusions may be present. No pneumothorax. Stable cardiomediastinal  silhouette. No acute osseous pathology. IMPRESSION: 1. Endotracheal tube with tip above the carina. 2. Bilateral lower lobe airspace opacities progressed since the prior radiograph. Electronically Signed   By: Elgie Collard M.D.   On: 04/24/2021 19:57   DG Chest Port 1 View  Result Date: 04/24/2021 CLINICAL DATA:  Post intubation. EXAM: PORTABLE CHEST 1 VIEW COMPARISON:  February 23, 2019 FINDINGS: Post intubation with endotracheal tube terminating 1.3 cm above the carina. Slight retraction may be considered. Enteric catheter terminates in the region of the gastric antrum. Cardiomediastinal silhouette is normal. Mediastinal contours appear intact. Streaky and patchy airspace opacities with lower lobe predominance are present in both lungs. Osseous structures are without acute abnormality. Soft tissues are grossly normal. IMPRESSION: 1. Post intubation with endotracheal tube terminating 1.3 cm above the carina. Slight retraction may be considered. 2. Streaky and patchy airspace opacities with lower lobe predominance in both lungs. 3. No evidence of pneumothorax. Electronically Signed   By: Ted Mcalpine M.D.   On: 04/24/2021 15:20   EEG adult  Result Date: 04/25/2021 Charlsie Quest, MD     04/25/2021  9:01 PM Patient Name: Keiland Pickering MRN: [id number] Epilepsy Attending: Charlsie Quest Referring Physician/Provider: Dr Briant Sites Date: 04/25/2021 Duration: 24.40 mins Patient history: 25yo M with h/o epilepsy presented with breakthrough seizure. EEG to evaluate for seizure Level of alertness: Awake AEDs during EEG study: perampanel, VPA, Ativan Technical aspects: This EEG study was done with scalp electrodes positioned according to the 10-20 International system of electrode placement. Electrical activity was acquired at a sampling rate of 500Hz  and reviewed with a high frequency filter of 70Hz  and a low frequency filter of 1Hz . EEG data were recorded continuously and digitally stored.  Description: The posterior dominant rhythm consists of 9-10 Hz activity of moderate voltage (25-35 uV) seen predominantly in posterior head regions, symmetric and reactive to eye opening and eye closing. IMPRESSION: This study is within normal limits. No seizures or epileptiform discharges were seen throughout the recording.   ECHOCARDIOGRAM COMPLETE  Result Date: 04/25/2021    ECHOCARDIOGRAM REPORT   Patient Name:   TREAVON CASTILLEJA Date of Exam: 04/25/2021 Medical Rec #:  13/12/[id number]       Height:       70.0 in Accession #:    [name]      Weight:       157.6 lb Date of Birth:  07-21-1995       BSA:          1.886 m Patient Age:    25 years        BP:           96/57 mmHg Patient Gender: M               HR:           74 bpm. Exam Location:  Inpatient Procedure: 2D Echo, Cardiac Doppler, Color Doppler and Intracardiac            Opacification Agent Indications:    Murmur  History:  Patient has no prior history of Echocardiogram examinations.                 Signs/Symptoms:Edema, Resp. failure, Polysubstance abuse and                 Murmur.  Sonographer:    Lavenia Atlas RDCS Referring Phys: [phone] PAULA B SIMPSON IMPRESSIONS  1. Left ventricular ejection fraction, by estimation, is 55 to 60%. The left ventricle has normal function. The left ventricle has no regional wall motion abnormalities. Left ventricular diastolic parameters were normal.  2. Right ventricular systolic function is normal. The right ventricular size is normal. There is normal pulmonary artery systolic pressure.  3. The mitral valve is normal in structure. Trivial mitral valve regurgitation. No evidence of mitral stenosis.  4. The aortic valve is tricuspid. Aortic valve regurgitation is not visualized. No aortic stenosis is present.  5. The inferior vena cava is normal in size with greater than 50% respiratory variability, suggesting right atrial pressure of 3 mmHg. FINDINGS  Left Ventricle: Left ventricular ejection  fraction, by estimation, is 55 to 60%. The left ventricle has normal function. The left ventricle has no regional wall motion abnormalities. Definity contrast agent was given IV to delineate the left ventricular  endocardial borders. The left ventricular internal cavity size was normal in size. There is no left ventricular hypertrophy. Left ventricular diastolic parameters were normal. Right Ventricle: The right ventricular size is normal. No increase in right ventricular wall thickness. Right ventricular systolic function is normal. There is normal pulmonary artery systolic pressure. The tricuspid regurgitant velocity is 2.21 m/s, and  with an assumed right atrial pressure of 3 mmHg, the estimated right ventricular systolic pressure is 22.5 mmHg. Left Atrium: Left atrial size was normal in size. Right Atrium: Right atrial size was normal in size. Pericardium: There is no evidence of pericardial effusion. Mitral Valve: The mitral valve is normal in structure. Trivial mitral valve regurgitation. No evidence of mitral valve stenosis. Tricuspid Valve: The tricuspid valve is normal in structure. Tricuspid valve regurgitation is trivial. No evidence of tricuspid stenosis. Aortic Valve: The aortic valve is tricuspid. Aortic valve regurgitation is not visualized. No aortic stenosis is present. Pulmonic Valve: The pulmonic valve was normal in structure. Pulmonic valve regurgitation is not visualized. No evidence of pulmonic stenosis. Aorta: The aortic root is normal in size and structure. Venous: The inferior vena cava is normal in size with greater than 50% respiratory variability, suggesting right atrial pressure of 3 mmHg. IAS/Shunts: No atrial level shunt detected by color flow Doppler.  LEFT VENTRICLE PLAX 2D LVIDd:         3.40 cm   Diastology LVIDs:         2.34 cm   LV e' medial:    12.10 cm/s LV PW:         1.00 cm   LV E/e' medial:  6.1 LV IVS:        1.12 cm   LV e' lateral:   12.60 cm/s LVOT diam:     2.30 cm    LV E/e' lateral: 5.8 LV SV:         76 LV SV Index:   40 LVOT Area:     4.15 cm  RIGHT VENTRICLE RV Basal diam:  4.43 cm RV S prime:     8.92 cm/s TAPSE (M-mode): 1.6 cm LEFT ATRIUM           Index LA diam:  3.40 cm 1.80 cm/m LA Vol (A4C): 34.8 ml 18.45 ml/m  AORTIC VALVE LVOT Vmax:   92.00 cm/s LVOT Vmean:  65.300 cm/s LVOT VTI:    0.183 m  AORTA Ao Root diam: 2.50 cm MITRAL VALVE               TRICUSPID VALVE MV Area (PHT): 4.10 cm    TR Peak grad:   19.5 mmHg MV Decel Time: 185 msec    TR Vmax:        221.00 cm/s MV E velocity: 73.70 cm/s MV A velocity: 55.30 cm/s  SHUNTS MV E/A ratio:  1.33        Systemic VTI:  0.18 m                            Systemic Diam: 2.30 cm Charlton Haws MD Electronically signed by Charlton Haws MD Signature Date/Time: 04/25/2021/10:39:56 AM    Final      Labs:   Basic Metabolic Panel: Recent Labs  Lab 04/28/21 0330 04/30/21 0221 05/01/21 0251 05/02/21 0345 05/03/21 0113  NA 137 136 139 136 133*  K 3.8 3.9 4.2 4.3 4.7  CL 108 104 106 102 97*  CO2 GLUCOSE 74 102* 101* 102* 78  BUN <5* 5* 5* 6 15  CREATININE 0.71 0.75 0.77 0.75 0.72  CALCIUM 8.2* 8.1* 8.4* 8.4* 8.8*  MG  --   --   --  1.8 1.9    GFR Estimated Creatinine Clearance: 144.5 mL/min (by C-G formula based on SCr of 0.72 mg/dL). Liver Function Tests: Recent Labs  Lab 05/03/21 0113  AST 14*  ALT 13  ALKPHOS 106  BILITOT 0.5  PROT 5.4*  ALBUMIN 2.3*    No results for input(s): LIPASE, AMYLASE in the last 168 hours. No results for input(s): AMMONIA in the last 168 hours. Coagulation profile No results for input(s): INR, PROTIME in the last 168 hours.  CBC: Recent Labs  Lab 04/28/21 0330 04/29/21 1221 04/30/21 0221 04/30/21 1425 05/01/21 0251 05/02/21 0345 05/03/21 0113  WBC 7.2  --  7.0  --  6.2 6.1 9.5  HGB 7.7*   < > 7.4* 9.5* 8.0* 8.6* 8.9*  HCT 26.0*   < > 25.3* 32.5* 27.7* 29.5* 31.2*  MCV 81.5  --  81.6  --  82.0 82.9 82.8  PLT 254  --  267  --   269 264 313   < > = values in this interval not displayed.    Cardiac Enzymes: No results for input(s): CKTOTAL, CKMB, CKMBINDEX, TROPONINI in the last 168 hours. BNP: Invalid input(s): POCBNP CBG: Recent Labs  Lab 05/02/21 1240 05/02/21 1551 05/02/21 2117 05/03/21 0615 05/03/21 1216  GLUCAP 119* 103* 100* 129* 114*    D-Dimer No results for input(s): DDIMER in the last 72 hours. Hgb A1c No results for input(s): HGBA1C in the last 72 hours. Lipid Profile No results for input(s): CHOL, HDL, LDLCALC, TRIG, CHOLHDL, LDLDIRECT in the last 72 hours. Thyroid function studies No results for input(s): TSH, T4TOTAL, T3FREE, THYROIDAB in the last 72 hours.  Invalid input(s): FREET3 Anemia work up No results for input(s): VITAMINB12, FOLATE, FERRITIN, TIBC, IRON, RETICCTPCT in the last 72 hours. Microbiology Recent Results (from the past 240 hour(s))  Culture, blood (single)     Status: None   Collection Time: 04/24/21  3:26 PM   Specimen: BLOOD RIGHT HAND  Result Value Ref  Range Status   Specimen Description BLOOD RIGHT HAND  Final   Special Requests   Final    BOTTLES DRAWN AEROBIC ONLY Blood Culture adequate volume   Culture   Final    NO GROWTH 5 DAYS Performed at [facility], [address] [place], [place] [address]    Report Status 04/29/2021 FINAL  Final  Blood culture (routine x 2)     Status: None   Collection Time: 04/24/21  3:26 PM   Specimen: BLOOD  Result Value Ref Range Status   Specimen Description BLOOD  Final   Special Requests NONE  Final   Culture   Final    NO GROWTH 5 DAYS Performed at [facility], [address] [place], [place] [address]    Report Status 04/29/2021 FINAL  Final  Blood culture (routine x 2)     Status: None   Collection Time: 04/24/21  3:26 PM   Specimen: BLOOD  Result Value Ref Range Status   Specimen Description BLOOD  Final   Special Requests NONE  Final   Culture   Final    NO GROWTH 5 DAYS Performed at [facility], [address], [place], [place] [address]    Report Status 04/29/2021 FINAL  Final  MRSA Next Gen by PCR, Nasal     Status: None   Collection Time: 04/24/21  7:18 PM   Specimen: Nasal Mucosa; Nasal Swab  Result Value Ref Range Status   MRSA by PCR Next Gen NOT DETECTED NOT DETECTED Final    Comment: (NOTE) The GeneXpert MRSA Assay (FDA approved for NASAL specimens only), is one component of a comprehensive MRSA colonization surveillance program. It is not intended to diagnose MRSA infection nor to guide or monitor treatment for MRSA infections. Test performance is not FDA approved in patients less than 39 years old. Performed at [facility], [address], [place], [place] [address]   Culture, Respiratory w Gram Stain     Status: None   Collection Time: 04/24/21  7:19 PM   Specimen: Tracheal Aspirate; Respiratory  Result Value Ref Range Status   Specimen Description TRACHEAL ASPIRATE  Final   Special Requests NONE  Final   Gram Stain   Final    RARE SQUAMOUS EPITHELIAL CELLS PRESENT ABUNDANT WBC PRESENT, PREDOMINANTLY PMN RARE GRAM POSITIVE COCCI    Culture   Final    RARE Normal respiratory flora-no Staph aureus or Pseudomonas seen Performed at [facility], [address], [place], [place] [address]    Report Status 04/27/2021 FINAL  Final  Resp Panel by RT-PCR (Flu A&B, Covid) Nasopharyngeal Swab     Status: None   Collection Time: 04/27/21  1:17 AM   Specimen: Nasopharyngeal Swab; Nasopharyngeal(NP) swabs in vial transport medium  Result Value Ref Range Status   SARS Coronavirus 2 by RT PCR NEGATIVE NEGATIVE Final    Comment: (NOTE) SARS-CoV-2 target nucleic acids are NOT DETECTED.  The SARS-CoV-2 RNA is generally detectable in upper respiratory specimens during the acute phase of infection. The lowest concentration of SARS-CoV-2 viral copies this assay can detect is 138 copies/mL. A negative result does not preclude SARS-Cov-2 infection and should  not be used as the sole basis for treatment or other patient management decisions. A negative result may occur with  improper specimen collection/handling, submission of specimen other than nasopharyngeal swab, presence of viral mutation(s) within the areas targeted by this assay, and inadequate number of viral copies(<138 copies/mL). A  negative result must be combined with clinical observations, patient history, and epidemiological information. The expected result is Negative.  Fact Sheet for Patients:  BloggerCourse.com  Fact Sheet for Healthcare Providers:  SeriousBroker.it  This test is no t yet approved or cleared by the [place] FDA and  has been authorized for detection and/or diagnosis of SARS-CoV-2 by FDA under an Emergency Use Authorization (EUA). This EUA will remain  in effect (meaning this test can be used) for the duration of the COVID-19 declaration under Section 564(b)(1) of the Act, 21 U.S.C.section 360bbb-3(b)(1), unless the authorization is terminated  or revoked sooner.       Influenza A by PCR NEGATIVE NEGATIVE Final   Influenza B by PCR NEGATIVE NEGATIVE Final    Comment: (NOTE) The Xpert Xpress SARS-CoV-2/FLU/RSV plus assay is intended as an aid in the diagnosis of influenza from Nasopharyngeal swab specimens and should not be used as a sole basis for treatment. Nasal washings and aspirates are unacceptable for Xpert Xpress SARS-CoV-2/FLU/RSV testing.  Fact Sheet for Patients: BloggerCourse.com  Fact Sheet for Healthcare Providers: SeriousBroker.it  This test is not yet approved or cleared by the [place] FDA and has been authorized for detection and/or diagnosis of SARS-CoV-2 by FDA under an Emergency Use Authorization (EUA). This EUA will remain in effect (meaning this test can be used) for the duration of the COVID-19 declaration under  Section 564(b)(1) of the Act, 21 U.S.C. section 360bbb-3(b)(1), unless the authorization is terminated or revoked.  Performed at [facility], [address], [place], [place] [address]   Surgical pcr screen     Status: None   Collection Time: 04/29/21 10:26 PM   Specimen: Nasal Mucosa; Nasal Swab  Result Value Ref Range Status   MRSA, PCR NEGATIVE NEGATIVE Final   Staphylococcus aureus NEGATIVE NEGATIVE Final    Comment: (NOTE) The Xpert SA Assay (FDA approved for NASAL specimens in patients 61 years of age and older), is one component of a comprehensive surveillance program. It is not intended to diagnose infection nor to guide or monitor treatment. Performed at [facility], [address], [place], [place] [address]      Discharge Instructions:   Discharge Instructions     Diet - low sodium heart healthy   Complete by: As directed    SOFT diet, do not advance until seen by GI   Discharge instructions   Complete by: As directed    Continue soft diet only until seen by GI on the next visit.Follw up with [facility] as outpatient (need to be scheduled).  Would need repeat endoscopy in 3 to 4 weeks for retreatment.  Follow-up with your primary care physician in 1 week and check blood work.   Discharge wound care:   Complete by: As directed    Cover penile lesion with xeroform gauze, top with ABD pad in mesh underwear to protect.   Increase activity slowly   Complete by: As directed       Allergies as of 05/04/2021       Reactions   Tessalon [benzonatate] Swelling   Throat swelling        Medication List     STOP taking these medications    famotidine 20 MG tablet Commonly known as: PEPCID   hydrOXYzine 25 MG tablet Commonly known as: ATARAX/VISTARIL   linaclotide 290 MCG Caps capsule Commonly known as: Linzess   lisdexamfetamine 60 MG capsule Commonly known as: VYVANSE   LORazepam 1  MG tablet Commonly known as: ATIVAN   naproxen 500  MG tablet Commonly known as: NAPROSYN   promethazine 25 MG tablet Commonly known as: PHENERGAN Replaced by: promethazine 25 MG suppository       TAKE these medications    acetaminophen 325 MG tablet Commonly known as: TYLENOL Take 2 tablets (650 mg total) by mouth every 4 (four) hours as needed for mild pain, fever or headache.   albuterol 108 (90 Base) MCG/ACT inhaler Commonly known as: VENTOLIN HFA Inhale 1-2 puffs into the lungs every 6 (six) hours as needed for wheezing or shortness of breath.   diazepam 5 MG tablet Commonly known as: VALIUM Take 1 tablet (5 mg total) by mouth every morning. What changed:  when to take this reasons to take this   diazepam 5 MG tablet Commonly known as: Valium Take 2 tablets (10 mg total) by mouth every evening. What changed: You were already taking a medication with the same name, and this prescription was added. Make sure you understand how and when to take each.   divalproex 125 MG capsule Commonly known as: DEPAKOTE SPRINKLE Take 6 capsules (750 mg total) by mouth every evening. What changed: You were already taking a medication with the same name, and this prescription was added. Make sure you understand how and when to take each.   divalproex 125 MG capsule Commonly known as: DEPAKOTE SPRINKLE Take 2 capsules (250 mg total) by mouth daily after breakfast. What changed: when to take this   docusate sodium 100 MG capsule Commonly known as: COLACE Take 1 capsule (100 mg total) by mouth 2 (two) times daily.   feeding supplement Liqd Take 237 mLs by mouth 4 (four) times daily.   ferrous sulfate 325 (65 FE) MG tablet Take 1 tablet (325 mg total) by mouth daily with breakfast.   folic acid 1 MG tablet Commonly known as: FOLVITE Take 1 tablet (1 mg total) by mouth daily.   lidocaine 5 % Commonly known as: LIDODERM Place 1 patch onto the skin daily. Remove & Discard patch within 12 hours or as directed by MD   methadone 10  MG tablet Commonly known as: DOLOPHINE Take 90 mg by mouth daily. At [facility] clinic   metoCLOPramide 5 MG tablet Commonly known as: REGLAN Take 1 tablet (5 mg total) by mouth 3 (three) times daily before meals.   multivitamin with minerals Tabs tablet Take 1 tablet by mouth daily.   nicotine 21 mg/24hr patch Commonly known as: NICODERM CQ - dosed in mg/24 hours Place 1 patch (21 mg total) onto the skin daily.   pantoprazole 40 MG tablet Commonly known as: PROTONIX Take 1 tablet (40 mg total) by mouth 2 (two) times daily before a meal for 60 days, THEN 1 tablet (40 mg total) daily. Start taking on: May 03, 2021   perampanel 2 MG tablet Commonly known as: FYCOMPA Take 4 mg by mouth daily.   polyethylene glycol powder 17 GM/SCOOP powder Commonly known as: GLYCOLAX/MIRALAX Take 255 g by mouth See admin instructions for 14 days. What changed:  how much to take how to take this   prazosin 1 MG capsule Commonly known as: MINIPRESS Take 1 capsule (1 mg total) by mouth at bedtime.   pregabalin 200 MG capsule Commonly known as: LYRICA Take 1 capsule (200 mg total) by mouth 3 (three) times daily.   promethazine 25 MG suppository Commonly known as: PHENERGAN Place 1 suppository (25 mg total) rectally every 6 (six) hours  as needed for refractory nausea / vomiting. Replaces: promethazine 25 MG tablet   thiamine 100 MG tablet Take 1 tablet (100 mg total) by mouth daily.               Durable Medical Equipment  (From admission, onward)           Start     Ordered   05/03/21 1500  For home use only DME Walker rolling  Once       Question Answer Comment  Walker: With 5 Inch Wheels   Patient needs a walker to treat with the following condition Weakness      05/03/21 1459              Discharge Care Instructions  (From admission, onward)           Start     Ordered   05/03/21 0000  Discharge wound care:       Comments: Cover penile lesion with  xeroform gauze, top with ABD pad in mesh underwear to protect.   05/03/21 1218              Follow-up Information     Services, [facility] Follow up.   Why: Please contact this agency for mental health therapy and medication management services. Contact information: [address] [place] [address] [phone]         [facility]. Schedule an appointment as soon as possible for a visit in 4 week(s).   Why: for repeat endoscopy Contact information: [address] [place] [address] [phone]        Waldon Reining, MD. Schedule an appointment as soon as possible for a visit in 1 week(s).   Specialties: Family Medicine, Sports Medicine Contact information: [address] [place] [address] [phone]                  Time coordinating discharge: 39 minutes  Signed:  Suzana Sohail  [facility] 05/03/2021, 3:50 PM

## 2021-05-03 NOTE — TOC Transition Note (Signed)
Transition of Care [facility]) - CM/SW Discharge Note   Patient Details  Name: Vincent Green MRN: [id number] Date of Birth: 17-Oct-1995  Transition of [facility]) CM/SW Contact:  Geralynn Ochs, LCSW Phone Number: 05/03/2021, 4:16 PM   Clinical Narrative:   CSW received bed offer from [facility] and met with patient to discuss. After discussion today, patient now refusing AIR, and wants to discharge home. CSW met with patient again with father present at bedside, father attempted to talk patient into rehab but patient continuing to refuse and wants to go home. Family in agreement for patient going home. CSW asked about outpatient therapy, patient not interested. CSW ordered RW through [employer] and asked MD to send meds through [facility]. CSW discussed transport home, and family says they don't think he can get into their small car. Patient requested ambulance transport. CSW notified patient that it would be a long wait.   No other TOC needs at this time.     Final next level of care: Home/Self Care Barriers to Discharge: Barriers Resolved   Patient Goals and CMS Choice Patient states their goals for this hospitalization and ongoing recovery are:: to get better CMS Medicare.gov Compare Post Acute Care list provided to:: Patient Choice offered to / list presented to : Patient  Discharge Placement                Patient to be transferred to facility by: [facility] Name of family member notified: Self, Annie Main Patient and family notified of of transfer: 05/03/21  Discharge Plan and Services     Post Acute Care Choice: IP Rehab          DME Arranged: Gilford Rile rolling DME Agency: [facility] Date DME Agency Contacted: 05/03/21   Representative spoke with at DME Agency: [facility] Determinants of Health ([place]) Interventions     Readmission Risk Interventions No flowsheet data found.

## 2021-05-04 DIAGNOSIS — F4310 Post-traumatic stress disorder, unspecified: Secondary | ICD-10-CM

## 2021-05-04 LAB — GLUCOSE, CAPILLARY: Glucose-Capillary: 89 mg/dL (ref 70–99)

## 2021-05-04 LAB — SURGICAL PATHOLOGY

## 2021-05-04 MED ORDER — PROMETHAZINE HCL 25 MG RE SUPP: 25.0000 mg | Freq: Four times a day (QID) | RECTAL | 0 refills | Status: DC | PRN

## 2021-05-04 NOTE — Progress Notes (Signed)
Pt has been discharged from the unit via [facility]. Father made aware of departure from unit. AVS has been given. All belongings sent with pt. Pt sent in good spirits.

## 2021-05-04 NOTE — Progress Notes (Signed)
[facility] came to pick up pt to home. Called father to see if he is able to accept pt at this time. He said he will not be home until after 6am. [facility] stated it might be around 8 am when they come pick pt up.

## 2021-05-04 NOTE — Progress Notes (Signed)
Patient was seen and examined at bedside. No interval complains.  Had some vomiting yesterday.  Promethazine has been added to the discharge instructions.  Vitals reviewed.  Please see discharge instructions from 05/03/2021.  Spoke with the patient's father prior to discharge.

## 2021-05-06 LAB — BENZODIAZEPINES,MS,WB/SP RFX
7-Aminoclonazepam: NEGATIVE ng/mL
Alprazolam: NEGATIVE ng/mL
Benzodiazepines Confirm: POSITIVE
Chlordiazepoxide: NEGATIVE
Clonazepam: NEGATIVE ng/mL
Desalkylflurazepam: NEGATIVE ng/mL
Desmethylchlordiazepoxide: NEGATIVE
Desmethyldiazepam: 297 ng/mL
Diazepam: 251 ng/mL
Flurazepam: NEGATIVE ng/mL
Lorazepam: 15 ng/mL
Midazolam: NEGATIVE ng/mL
Oxazepam: 23 ng/mL
Temazepam: 26 ng/mL
Triazolam: NEGATIVE ng/mL

## 2021-05-09 LAB — THC,MS,WB/SP RFX
Cannabidiol: NEGATIVE ng/mL
Cannabinoid Confirmation: POSITIVE
Cannabinol: NEGATIVE ng/mL
Carboxy-THC: 12.9 ng/mL
Hydroxy-THC: NEGATIVE ng/mL
Tetrahydrocannabinol(THC): NEGATIVE ng/mL

## 2021-05-09 LAB — DRUG SCREEN 10 W/CONF, SERUM
Amphetamines, IA: NEGATIVE ng/mL
Barbiturates, IA: NEGATIVE ug/mL
Benzodiazepines, IA: POSITIVE ng/mL — AB
Cocaine & Metabolite, IA: NEGATIVE ng/mL
Methadone, IA: POSITIVE ng/mL — AB
Opiates, IA: NEGATIVE ng/mL
Oxycodones, IA: NEGATIVE ng/mL
Phencyclidine, IA: NEGATIVE ng/mL
Propoxyphene, IA: NEGATIVE ng/mL
THC(Marijuana) Metabolite, IA: POSITIVE ng/mL — AB

## 2021-05-16 ENCOUNTER — Ambulatory Visit: Payer: Medicaid Other | Admitting: Gastroenterology

## 2021-05-16 ENCOUNTER — Encounter: Payer: Self-pay | Admitting: Gastroenterology

## 2021-06-15 ENCOUNTER — Telehealth: Payer: Self-pay

## 2021-06-15 NOTE — Telephone Encounter (Signed)
Unfortunately we have not seen the patient since 07/2019. He has been noncompliant with getting labs therefore we were not able to schedule him for needed colonoscopy/egd after that visit.   He was admitted 04/2021. Aspirated and required intubation. Inpatient EGD showed: Esophageal stenosis. Dilated. LA Grade D esophagitis with no bleeding. Biopsied. 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 Gastritis. Biopsied, mild chronic gastritis but no h.pylori. Erythematous duodenopathy. Normal second portion of the duodenum. Biopsied, normal. Also noted to have Hgb of 6 on admission and received 2 units of prbcs. Ferritin 14. Received IV iron. Celiac labs negative.   Per discharge summary, he was advised to follow up in 4 weeks with [place] for repeat EGD+/-ED and work up of IDA.   AGREE WITH [facility] FOR ED EVALUATION.  WE ALSO NEED TO OFFER HIM AN OV IN NEXT 1-2 WEEKS. I SHOULD HAVE APPT OPEN UP ON A Wednesday AFTERNOON. HAVE TO GET REBA TO OPEN SCHEDULE.

## 2021-06-15 NOTE — Telephone Encounter (Signed)
Routing to front to get patient in as soon as possible in the next 1 to 2 weeks.

## 2021-06-15 NOTE — Telephone Encounter (Signed)
Pt's dad called stating that the pt has been throwing up for going on 4 weeks now. Pt has lost 25 to 30 lbs and has only been able to keep boost down every now and then. Pt is refusing to go to the ER due to previous bad experiences. Pt went to urgent care yesterday in [place], but did not stay to be seen due to them telling his dad that they would not be able to do a referral for him due to his insurance. Pt is also unable to keep phenergan down to help with the vomiting. I informed the pt's dad that with his current symptoms and the amount of weight loss he has had that he really needs to go to the ER to be evaluated. Pt's dad stated that he would talk to the pt and see what he can do to talk the pt into going to the ER.

## 2021-06-16 NOTE — Telephone Encounter (Signed)
Pt's dad was made aware and verbalized understanding.

## 2021-06-22 ENCOUNTER — Ambulatory Visit: Payer: Medicaid Other | Admitting: Gastroenterology

## 2021-07-06 ENCOUNTER — Other Ambulatory Visit: Payer: Self-pay

## 2021-07-06 ENCOUNTER — Emergency Department (HOSPITAL_COMMUNITY)
Admission: EM | Admit: 2021-07-06 | Discharge: 2021-07-06 | Disposition: A | Discharge status: Home / Self Care | Payer: Medicaid Other | Attending: Emergency Medicine | Admitting: Emergency Medicine

## 2021-07-06 ENCOUNTER — Encounter (HOSPITAL_COMMUNITY): Payer: Self-pay

## 2021-07-06 DIAGNOSIS — Z5321 Procedure and treatment not carried out due to patient leaving prior to being seen by health care provider: Secondary | ICD-10-CM | POA: Diagnosis not present

## 2021-07-06 DIAGNOSIS — Z760 Encounter for issue of repeat prescription: Secondary | ICD-10-CM | POA: Insufficient documentation

## 2021-07-06 NOTE — ED Triage Notes (Signed)
Pt requesting refill on phenobarbital

## 2021-07-12 ENCOUNTER — Other Ambulatory Visit: Payer: Self-pay

## 2021-07-12 ENCOUNTER — Encounter: Payer: Self-pay | Admitting: *Deleted

## 2021-07-12 ENCOUNTER — Ambulatory Visit (INDEPENDENT_AMBULATORY_CARE_PROVIDER_SITE_OTHER): Payer: Medicaid Other | Admitting: Internal Medicine

## 2021-07-12 ENCOUNTER — Encounter: Payer: Self-pay | Admitting: Internal Medicine

## 2021-07-12 VITALS — BP 106/76 | HR 97 | Temp 97.7°F | Ht 72.0 in | Wt 139.8 lb

## 2021-07-12 DIAGNOSIS — K625 Hemorrhage of anus and rectum: Secondary | ICD-10-CM

## 2021-07-12 DIAGNOSIS — K2101 Gastro-esophageal reflux disease with esophagitis, with bleeding: Secondary | ICD-10-CM

## 2021-07-12 DIAGNOSIS — D509 Iron deficiency anemia, unspecified: Secondary | ICD-10-CM

## 2021-07-12 NOTE — Progress Notes (Signed)
Primary Care Physician:  Waldon Reining, MD Primary Gastroenterologist:  Dr. Jena Gauss  Pre-Procedure History & Physical: HPI:  Vincent Green is a 26 y.o. male here for further evaluation of dysphagia, GERD with peptic stricture (previously dilated ), iron deficiency anemia, constipation and rectal bleeding.  Patient was hospitalized in [place] in November of last year with nausea, vomiting, hematemesis and rectal bleeding.  Patient ended up with aspiration pneumonia was intubated.  EGD demonstrated severe reflux esophagitis with tight stricture requiring balloon dilation.  It was recommended he return in 4 weeks for repeat session.  He has been on omeprazole 20 mg orally twice daily.  He describes ongoing dysphagia to solids.  Actually, he reports more e dysphagia symptoms rather than actual nausea and vomiting.  He has obstipation going back to childhood.  States he may go 6 to 7 days without a bowel movement.  Does not get the urge very often.  When he does have a bowel movement, it is a very large formed stool and he has bleeding associated with it.  We saw this gentleman back in 2021 for similar symptoms.  Recommended EGD and colonoscopy that time but he did not follow through.  Of note, he smokes marijuana occasionally but only once every 2 to 3 months.  History of opioid use disorder.  Takes methadone.  Also, has history of seizures for which he is on meds (see below).  He denies any abdominal pain whatsoever.  He states he has never had any sort of evaluation for constipation going back to childhood.  Past Medical History:  Diagnosis Date   ADHD    Anxiety    Anxiety    Chronic dental pain    COPD (chronic obstructive pulmonary disease) (HCC)    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 (documented 08/12/19)   Tachycardia     Past Surgical History:   Procedure Laterality Date   BALLOON DILATION N/A 04/30/2021   Procedure: BALLOON DILATION;  Surgeon: Shellia Cleverly, DO;  Location: [facility];  Service: Gastroenterology;  Laterality: N/A;   BIOPSY  04/30/2021   Procedure: BIOPSY;  Surgeon: Shellia Cleverly, DO;  Location: [facility];  Service: Gastroenterology;;   CHOANAL ATRESIA REPAIR     ESOPHAGOGASTRODUODENOSCOPY (EGD) WITH PROPOFOL N/A 04/30/2021   Procedure: ESOPHAGOGASTRODUODENOSCOPY (EGD) WITH PROPOFOL;  Surgeon: Shellia Cleverly, DO;  Location: [facility];  Service: Gastroenterology;  Laterality: N/A;   NOSE SURGERY     SKIN GRAFT      Prior to Admission medications   Medication Sig Start Date End Date Taking? Authorizing Provider  acetaminophen (TYLENOL) 325 MG tablet Take 2 tablets (650 mg total) by mouth every 4 (four) hours as needed for mild pain, fever or headache. 05/03/21  Yes Pokhrel, Laxman, MD  albuterol (VENTOLIN HFA) 108 (90 Base) MCG/ACT inhaler Inhale 1-2 puffs into the lungs every 6 (six) hours as needed for wheezing or shortness of breath. 05/03/21  Yes Pokhrel, Laxman, MD  cholecalciferol (VITAMIN D3) 25 MCG (1000 UNIT) tablet Take 1,000 Units by mouth daily.   Yes [provider]  diazepam (VALIUM) 10 MG tablet Take 10 mg by mouth 2 (two) times daily.   Yes [provider]  Lacosamide 100 MG TABS Take 1 tablet by mouth 2 (two) times daily.   Yes [provider]  methadone (  DOLOPHINE) 10 MG tablet Take 70 mg by mouth daily. At [facility] clinic   Yes [provider]  Multiple Vitamin (MULTIVITAMIN WITH MINERALS) TABS tablet Take 1 tablet by mouth daily. Patient taking differently: Take 1 tablet by mouth. Sometimes takes multivitamin 05/04/21  Yes Pokhrel, Laxman, MD  pregabalin (LYRICA) 200 MG capsule Take 1 capsule (200 mg total) by mouth 3 (three) times daily. 05/03/21  Yes Pokhrel, Rebekah Chesterfield, MD  promethazine (PHENERGAN) 25 MG suppository Place 1 suppository (25 mg  total) rectally every 6 (six) hours as needed for refractory nausea / vomiting. 05/04/21  Yes Pokhrel, Laxman, MD  promethazine (PHENERGAN) 25 MG tablet Take 25 mg by mouth every 6 (six) hours as needed for nausea or vomiting.   Yes [provider]  diphenhydrAMINE (BENADRYL) 25 mg capsule Take 25 mg by mouth every 6 (six) hours as needed.  10/14/19  [provider]  fluticasone (FLONASE) 50 MCG/ACT nasal spray Place 1 spray into both nostrils daily. Patient not taking: Reported on 03/04/2019 02/10/17 03/13/19  Caccavale, Sophia, PA-C  omeprazole (PRILOSEC) 20 MG capsule Take 1 capsule (20 mg total) by mouth 2 (two) times daily before a meal. Patient not taking: No sig reported 08/12/19 10/25/20  Tiffany Kocher, PA-C    Allergies as of 07/12/2021 - Review Complete 07/12/2021  Allergen Reaction Noted   Tessalon [benzonatate] Swelling 04/28/2016    Family History  Problem Relation Age of Onset   Other Mother 49       04/2019 autopsy pending   GER disease Paternal Grandmother    Colon cancer Neg Hx     Social History   Socioeconomic History   Marital status: Single    Spouse name: Not on file   Number of children: Not on file   Years of education: Not on file   Highest education level: Not on file  Occupational History   Not on file  Tobacco Use   Smoking status: Every Day    Packs/day: 0.50    Types: Cigarettes   Smokeless tobacco: Never   Tobacco comments:    smokes 6 cigarettes daily  Vaping Use   Vaping Use: Former  Substance and Sexual Activity   Alcohol use: No   Drug use: Not Currently    Types: Benzodiazepines, Marijuana   Sexual activity: Not on file  Other Topics Concern   Not on file  Social History Narrative   Not on file   Social Determinants of Health   Financial Resource Strain: Not on file  Food Insecurity: Not on file  Transportation Needs: Not on file  Physical Activity: Not on file  Stress: Not on file  Social Connections: Not on  file  Intimate Partner Violence: Not on file    Review of Systems: See HPI, otherwise negative ROS  Physical Exam: BP 106/76    Pulse 97    Temp 97.7 F (36.5 C) (Temporal)    Ht 6' (1.829 m)    Wt 139 lb 12.8 oz (63.4 kg)    BMI 18.96 kg/m  General:   Alert, somewhat disheveled 26 year old.  Pleasant and cooperative in NAD; accompanied by his father. Nose:  No deformity, discharge,  or lesions. Mouth:  No deformity or lesions. Neck:  Supple; no masses or thyromegaly. No significant cervical adenopathy. Lungs:  Clear throughout to auscultation.   No wheezes, crackles, or rhonchi. No acute distress. Heart:  Regular rate and rhythm; no murmurs, clicks, rubs,  or gallops. Abdomen: Non-distended, normal bowel sounds.  Soft and nontender without appreciable mass or hepatosplenomegaly.  Pulses:  Normal pulses noted. Extremities:  Without clubbing or edema. Rectal: No external lesions.  Good sphincter tone.  No mass in rectal vault formed brown stool in the proximal rectal vault Hemoccult positive.  Impression/Plan: 26 year old gentleman with a history of severe reflux esophagitis with stricture requiring dilation last year.  Ongoing dysphagia.  Presented with  profound iron deficiency anemia and aspiration pneumonia for which he was intubated.  He required a 2 unit transfusion back in November.  He continues to have intermittent rectal bleeding in the setting of severe constipation.  Bowel dysfunction could be multifactorial in etiology.  Going back to childhood with his symptoms,  need to be concerned about the possibility of an aganglionic rectal segment (undiagnosed Hirschsprung's disease) vs versus dyssynergy; polypharmacy including an element of chronic opioid induced constipation may be  more likely at play.  Rectal bleeding and IDA warrant further investigation.  Hemoccult positive today.   Recommendations:  As discussed, I recommend an upper endoscopy with possible esophageal  dilation and a diagnostic colonoscopy for dysphagia, stricture, rectal bleeding and iron deficiency anemia).  ASA 3/propofol.  The risks, benefits, limitations, imponderables and alternatives regarding both EGD and colonoscopy have been reviewed with the patient. Questions have been answered. All parties agreeable.    Linzess 290 daily 3 days prior to the procedure to augment the prep compliance with preparation instructions emphasized.  Please follow our prep instructions closely.  Further recommendations to follow.     Notice: This dictation was prepared with Dragon dictation along with smaller phrase technology. Any transcriptional errors that result from this process are unintentional and may not be corrected upon review.

## 2021-07-12 NOTE — Patient Instructions (Signed)
It was good meeting you today!  As discussed, you would benefit her on your undergoing an upper endoscopy with possible esophageal dilation and a diagnostic colonoscopy for dysphagia, stricture, rectal bleeding and iron deficiency anemia).  ASA 3/propofol  We will plan for later this week.  You will take a gelcap called Linzess 290 today and tomorrow (tomorrow 290 in the morning and repeat dose in the evening)-samples provided as directed to help your bowels get cleaned out.  Please follow our prep instructions closely.  Once this evaluates been carried out I will have more information upon which to base treatment recommendations.

## 2021-07-13 ENCOUNTER — Encounter (HOSPITAL_COMMUNITY)
Admission: RE | Admit: 2021-07-13 | Discharge: 2021-07-13 | Disposition: A | Discharge status: Home / Self Care | Payer: Medicaid Other | Source: Ambulatory Visit | Attending: Internal Medicine | Admitting: Internal Medicine

## 2021-07-13 ENCOUNTER — Telehealth: Payer: Self-pay | Admitting: *Deleted

## 2021-07-13 ENCOUNTER — Encounter (HOSPITAL_COMMUNITY): Payer: Self-pay

## 2021-07-13 DIAGNOSIS — D509 Iron deficiency anemia, unspecified: Secondary | ICD-10-CM

## 2021-07-13 NOTE — Telephone Encounter (Signed)
-----   Message from Elsie Amis, RN sent at 07/13/2021 11:41 AM EST ----- Regarding: Cancel It's me again! I have been talking talking to patient and Father. Both state that Vincent Green's seizure medication need to be "adjusted because they aren't working". They state their PCP or neurologist, "would not work with them to change his meds". They wanted [place] to to that. I explained that we could not do that but that they could go to ED if they felt they needed to, but that the prescribing Dr had to fix dosage of meds. Dr Johnnette Litter came in as well and said in light of this, we should postpone the procedure until he was regulated on his meds. I will have Eber Jones put him in the depot until he gets this done.

## 2021-07-13 NOTE — Telephone Encounter (Signed)
Per Delphia Grates in endo, pt and father is aware procedure cancelled for tomorrow.

## 2021-07-13 NOTE — Pre-Procedure Instructions (Signed)
Patient's grandmother called and left message on scheduler's voicemail that patient was late for his pre-op because, " he had a seizure this morning." She stated that she was on the way to pick him up to bring him to his appointment. Upon arrival, patient was accompanied by his Father. He asked staff if we could "change or adjust his seizure medications because they aren't working. They have so many side effects and I need them changed". I explained to them that changing medications could only be done by the ordering provider. Dr Johnnette Litter evaluated patient and wants meds regulated before procedure is done. Dr Jena Gauss and office staff notified of this, as well as Carloyn Jaeger, OR scheduler. Both father and patient verbalized understanding of this and will call office back when his meds are regulated.

## 2021-07-13 NOTE — Patient Instructions (Signed)
Vincent CrampGrayson Green  07/13/2021     @PREFPERIOPPHARMACY @   Your procedure is scheduled on  07/14/2021.   [facility] at  [facility] A.M.   Call this number if you have problems the morning of surgery:  [phone]   Remember:  Follow the diet and prep instructions given to you by the office.    Take these medicines the morning of surgery with A SIP OF WATER                   valium, vimpat, vyvanse, methadone, lyrica.     Do not wear jewelry, make-up or nail polish.  Do not wear lotions, powders, or perfumes, or deodorant.  Do not shave 48 hours prior to surgery.  Men may shave face and neck.  Do not bring valuables to the hospital.  [facility] belongings or valuables.  Contacts, dentures or bridgework may not be worn into surgery.  Leave your suitcase in the car.  After surgery it may be brought to your room.  For patients admitted to the hospital, discharge time will be determined by your treatment team.  Patients discharged the day of surgery will not be allowed to drive home and must have someone with them for 24 hours.    Special instructions:   DO NOT smoke tobacco or vape for 24 hours before your procedure.  Please read over the following fact sheets that you were given. Anesthesia Post-op Instructions and Care and Recovery After Surgery      Upper Endoscopy, Adult, Care After This sheet gives you information about how to care for yourself after your procedure. Your health care provider may also give you more specific instructions. If you have problems or questions, contact your health care provider. What can I expect after the procedure? After the procedure, it is common to have: A sore throat. Mild stomach pain or discomfort. Bloating. Nausea. Follow these instructions at home:  Follow instructions from your health care provider about what to eat or drink after your procedure. Return to your normal activities  as told by your health care provider. Ask your health care provider what activities are safe for you. Take over-the-counter and prescription medicines only as told by your health care provider. If you were given a sedative during the procedure, it can affect you for several hours. Do not drive or operate machinery until your health care provider says that it is safe. Keep all follow-up visits as told by your health care provider. This is important. Contact a health care provider if you have: A sore throat that lasts longer than one day. Trouble swallowing. Get help right away if: You vomit blood or your vomit looks like coffee grounds. You have: A fever. Bloody, black, or tarry stools. A severe sore throat or you cannot swallow. Difficulty breathing. Severe pain in your chest or abdomen. Summary After the procedure, it is common to have a sore throat, mild stomach discomfort, bloating, and nausea. If you were given a sedative during the procedure, it can affect you for several hours. Do not drive or operate machinery until your health care provider says that it is safe. Follow instructions from your health care provider about what to eat or drink after your procedure. Return to your normal activities as told by your health care provider. This information is not intended to replace advice given to you by your health care  provider. Make sure you discuss any questions you have with your health care provider. Document Revised: 04/11/2019 Document Reviewed: 11/05/2017 Elsevier Patient Education  2022 [employer] Esophageal Dilatation Esophageal dilatation, also called esophageal dilation, is a procedure to widen or open a blocked or narrowed part of the esophagus. The esophagus is the part of the body that moves food and liquid from the mouth to the stomach. You may need this procedure if: You have a buildup of scar tissue in your esophagus that makes it difficult, painful, or impossible to  swallow. This can be caused by gastroesophageal reflux disease (GERD). You have cancer of the esophagus. There is a problem with how food moves through your esophagus. In some cases, you may need this procedure repeated at a later time to dilate the esophagus gradually. Tell a health care provider about: Any allergies you have. All medicines you are taking, including vitamins, herbs, eye drops, creams, and over-the-counter medicines. Any problems you or family members have had with anesthetic medicines. Any blood disorders you have. Any surgeries you have had. Any medical conditions you have. Any antibiotic medicines you are required to take before dental procedures. Whether you are pregnant or may be pregnant. What are the risks? Generally, this is a safe procedure. However, problems may occur, including: Bleeding due to a tear in the lining of the esophagus. A hole, or perforation, in the esophagus. What happens before the procedure? Ask your health care provider about: Changing or stopping your regular medicines. This is especially important if you are taking diabetes medicines or blood thinners. Taking medicines such as aspirin and ibuprofen. These medicines can thin your blood. Do not take these medicines unless your health care provider tells you to take them. Taking over-the-counter medicines, vitamins, herbs, and supplements. Follow instructions from your health care provider about eating or drinking restrictions. Plan to have a responsible adult take you home from the hospital or clinic. Plan to have a responsible adult care for you for the time you are told after you leave the hospital or clinic. This is important. What happens during the procedure? You may be given a medicine to help you relax (sedative). A numbing medicine may be sprayed into the back of your throat, or you may gargle the medicine. Your health care provider may perform the dilatation using various surgical  instruments, such as: Simple dilators. This instrument is carefully placed in the esophagus to stretch it. Guided wire bougies. This involves using an endoscope to insert a wire into the esophagus. A dilator is passed over this wire to enlarge the esophagus. Then the wire is removed. Balloon dilators. An endoscope with a small balloon is inserted into the esophagus. The balloon is inflated to stretch the esophagus and open it up. The procedure may vary among health care providers and hospitals. What can I expect after the procedure? Your blood pressure, heart rate, breathing rate, and blood oxygen level will be monitored until you leave the hospital or clinic. Your throat may feel slightly sore and numb. This will get better over time. You will not be allowed to eat or drink until your throat is no longer numb. When you are able to drink, urinate, and sit on the edge of the bed without nausea or dizziness, you may be able to return home. Follow these instructions at home: Take over-the-counter and prescription medicines only as told by your health care provider. If you were given a sedative during the procedure, it can affect you  for several hours. Do not drive or operate machinery until your health care provider says that it is safe. Plan to have a responsible adult care for you for the time you are told. This is important. Follow instructions from your health care provider about any eating or drinking restrictions. Do not use any products that contain nicotine or tobacco, such as cigarettes, e-cigarettes, and chewing tobacco. If you need help quitting, ask your health care provider. Keep all follow-up visits. This is important. Contact a health care provider if: You have a fever. You have pain that is not relieved by medicine. Get help right away if: You have chest pain. You have trouble breathing. You have trouble swallowing. You vomit blood. You have black, tarry, or bloody  stools. These symptoms may represent a serious problem that is an emergency. Do not wait to see if the symptoms will go away. Get medical help right away. Call your local emergency services (911 in the [place] Do not drive yourself to the hospital. Summary Esophageal dilatation, also called esophageal dilation, is a procedure to widen or open a blocked or narrowed part of the esophagus. Plan to have a responsible adult take you home from the hospital or clinic. For this procedure, a numbing medicine may be sprayed into the back of your throat, or you may gargle the medicine. Do not drive or operate machinery until your health care provider says that it is safe. This information is not intended to replace advice given to you by your health care provider. Make sure you discuss any questions you have with your health care provider. Document Revised: 10/22/2019 Document Reviewed: 10/22/2019 Elsevier Patient Education  2022 Else[employer] Inc. Colonoscopy, Adult, Care After This sheet gives you information about how to care for yourself after your procedure. Your health care provider may also give you more specific instructions. If you have problems or questions, contact your health care provider. What can I expect after the procedure? After the procedure, it is common to have: A small amount of blood in your stool for 24 hours after the procedure. Some gas. Mild cramping or bloating of your abdomen. Follow these instructions at home: Eating and drinking  Drink enough fluid to keep your urine pale yellow. Follow instructions from your health care provider about eating or drinking restrictions. Resume your normal diet as instructed by your health care provider. Avoid heavy or fried foods that are hard to digest. Activity Rest as told by your health care provider. Avoid sitting for a long time without moving. Get up to take short walks every 1-2 hours. This is important to improve blood flow and  breathing. Ask for help if you feel weak or unsteady. Return to your normal activities as told by your health care provider. Ask your health care provider what activities are safe for you. Managing cramping and bloating  Try walking around when you have cramps or feel bloated. Apply heat to your abdomen as told by your health care provider. Use the heat source that your health care provider recommends, such as a moist heat pack or a heating pad. Place a towel between your skin and the heat source. Leave the heat on for 20-30 minutes. Remove the heat if your skin turns bright red. This is especially important if you are unable to feel pain, heat, or cold. You may have a greater risk of getting burned. General instructions If you were given a sedative during the procedure, it can affect you for several hours.  Do not drive or operate machinery until your health care provider says that it is safe. For the first 24 hours after the procedure: Do not sign important documents. Do not drink alcohol. Do your regular daily activities at a slower pace than normal. Eat soft foods that are easy to digest. Take over-the-counter and prescription medicines only as told by your health care provider. Keep all follow-up visits as told by your health care provider. This is important. Contact a health care provider if: You have blood in your stool 2-3 days after the procedure. Get help right away if you have: More than a small spotting of blood in your stool. Large blood clots in your stool. Swelling of your abdomen. Nausea or vomiting. A fever. Increasing pain in your abdomen that is not relieved with medicine. Summary After the procedure, it is common to have a small amount of blood in your stool. You may also have mild cramping and bloating of your abdomen. If you were given a sedative during the procedure, it can affect you for several hours. Do not drive or operate machinery until your health care  provider says that it is safe. Get help right away if you have a lot of blood in your stool, nausea or vomiting, a fever, or increased pain in your abdomen. This information is not intended to replace advice given to you by your health care provider. Make sure you discuss any questions you have with your health care provider. Document Revised: 04/11/2019 Document Reviewed: 12/30/2018 Elsevier Patient Education  2022 Elsevier Inc. Monitored Anesthesia Care, Care After This sheet gives you information about how to care for yourself after your procedure. Your health care provider may also give you more specific instructions. If you have problems or questions, contact your health care provider. What can I expect after the procedure? After the procedure, it is common to have: Tiredness. Forgetfulness about what happened after the procedure. Impaired judgment for important decisions. Nausea or vomiting. Some difficulty with balance. Follow these instructions at home: For the time period you were told by your health care provider:   Rest as needed. Do not participate in activities where you could fall or become injured. Do not drive or use machinery. Do not drink alcohol. Do not take sleeping pills or medicines that cause drowsiness. Do not make important decisions or sign legal documents. Do not take care of children on your own. Eating and drinking Follow the diet that is recommended by your health care provider. Drink enough fluid to keep your urine pale yellow. If you vomit: Drink water, juice, or soup when you can drink without vomiting. Make sure you have little or no nausea before eating solid foods. General instructions Have a responsible adult stay with you for the time you are told. It is important to have someone help care for you until you are awake and alert. Take over-the-counter and prescription medicines only as told by your health care provider. If you have sleep apnea,  surgery and certain medicines can increase your risk for breathing problems. Follow instructions from your health care provider about wearing your sleep device: Anytime you are sleeping, including during daytime naps. While taking prescription pain medicines, sleeping medicines, or medicines that make you drowsy. Avoid smoking. Keep all follow-up visits as told by your health care provider. This is important. Contact a health care provider if: You keep feeling nauseous or you keep vomiting. You feel light-headed. You are still sleepy or having trouble with balance  after 24 hours. You develop a rash. You have a fever. You have redness or swelling around the IV site. Get help right away if: You have trouble breathing. You have new-onset confusion at home. Summary For several hours after your procedure, you may feel tired. You may also be forgetful and have poor judgment. Have a responsible adult stay with you for the time you are told. It is important to have someone help care for you until you are awake and alert. Rest as told. Do not drive or operate machinery. Do not drink alcohol or take sleeping pills. Get help right away if you have trouble breathing, or if you suddenly become confused. This information is not intended to replace advice given to you by your health care provider. Make sure you discuss any questions you have with your health care provider. Document Revised: 02/19/2020 Document Reviewed: 05/08/2019 Elsevier Patient Education  2022 [employer].

## 2021-07-13 NOTE — Telephone Encounter (Signed)
I spoke with Dr. Gala Romney and agree'd we needed to cancel procedure. Patient was on for tomorrow for double procedure.   Called and had to LMOVM to call back to inform of procedure cancelled.

## 2021-07-14 ENCOUNTER — Encounter (HOSPITAL_COMMUNITY): Admission: RE | Payer: Self-pay | Source: Home / Self Care

## 2021-07-14 ENCOUNTER — Ambulatory Visit (HOSPITAL_COMMUNITY): Admission: RE | Admit: 2021-07-14 | Payer: Medicaid Other | Source: Home / Self Care | Admitting: Internal Medicine

## 2021-07-14 SURGERY — COLONOSCOPY WITH PROPOFOL
Anesthesia: Monitor Anesthesia Care

## 2021-07-28 ENCOUNTER — Telehealth: Payer: Self-pay | Admitting: Internal Medicine

## 2021-07-28 NOTE — Telephone Encounter (Signed)
Yes he needs another office visit and I would say he should call to make appointment after he knows when hs is seeing provider who is managing the seizures.

## 2021-07-28 NOTE — Telephone Encounter (Signed)
I spoke with father and he is aware of OV with [facility] on 3/6 at 2pm and to follow up with doctor managing patient's seizures.

## 2021-07-28 NOTE — Telephone Encounter (Signed)
Per last phone note reason procedure was cancelled  "- Message from Elsie Amis, RN sent at 07/13/2021 11:41 AM EST ----- Regarding: Cancel It's me again! I have been talking talking to patient and Father. Both state that Jeromy's seizure medication need to be "adjusted because they aren't working". They state their PCP or neurologist, "would not work with them to change his meds". They wanted [place] to to that. I explained that we could not do that but that they could go to ED if they felt they needed to, but that the prescribing Dr had to fix dosage of meds. Dr Johnnette Litter came in as well and said in light of this, we should postpone the procedure until he was regulated on his meds. I will have Eber Jones put him in the depot until he gets this done."  Dr. Jena Gauss had agree'd for this to be cancelled. Verlon Au, does patient need another OV or can we reschedule him?

## 2021-07-28 NOTE — Telephone Encounter (Signed)
Fowarding to Darl Pikes to schedule OV

## 2021-07-28 NOTE — Telephone Encounter (Signed)
Pt's father called to get patient rescheduled for his procedure. Pt's number is [phone] and the father's number is [phone]

## 2021-08-11 ENCOUNTER — Ambulatory Visit: Payer: Medicaid Other | Attending: Nurse Practitioner

## 2021-08-11 NOTE — Therapy (Deleted)
OUTPATIENT PHYSICAL THERAPY THORACOLUMBAR EVALUATION   Patient Name: Vincent Green MRN: [id number] DOB:December 16, 1995, 26 y.o., male Today's Date: 08/11/2021    Past Medical History:  Diagnosis Date   ADHD    Anxiety    Anxiety    Chronic dental pain    COPD (chronic obstructive pulmonary disease) (HCC)    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 (documented 08/12/19)   Tachycardia    Past Surgical History:  Procedure Laterality Date   BALLOON DILATION N/A 04/30/2021   Procedure: BALLOON DILATION;  Surgeon: Shellia Cleverly, DO;  Location: [facility];  Service: Gastroenterology;  Laterality: N/A;   BIOPSY  04/30/2021   Procedure: BIOPSY;  Surgeon: Shellia Cleverly, DO;  Location: [facility];  Service: Gastroenterology;;   CHOANAL ATRESIA REPAIR     ESOPHAGOGASTRODUODENOSCOPY (EGD) WITH PROPOFOL N/A 04/30/2021   Procedure: ESOPHAGOGASTRODUODENOSCOPY (EGD) WITH PROPOFOL;  Surgeon: Shellia Cleverly, DO;  Location: [facility];  Service: Gastroenterology;  Laterality: N/A;   NOSE SURGERY     SKIN GRAFT     Patient Active Problem List   Diagnosis Date Noted   Posttraumatic stress disorder    Esophageal stricture    Gastritis and gastroduodenitis    Acute esophagitis    Malnutrition of moderate degree 04/29/2021   Iron deficiency anemia    Esophageal dysphagia    Respiratory arrest (HCC) 04/24/2021   Acute hypoxemic respiratory failure (HCC) 04/24/2021   GERD (gastroesophageal reflux disease) 08/12/2019   Constipation 08/12/2019   Rectal bleeding 08/12/2019   Microcytic anemia 08/12/2019   RUQ pain 08/12/2019    PCP: Waldon Reining, MD  REFERRING PROVIDER: Merryl Hacker, NP  REFERRING DIAG: degenerative disk disease  THERAPY DIAG: DDD, chronic low back pain   ONSET DATE: Chronic  SUBJECTIVE:                                                                                                                                                                                            SUBJECTIVE STATEMENT: *** PERTINENT HISTORY:  ***  PAIN:  Are you having pain? {yes/no:20286} NPRS scale: ***/10 Pain location: *** Pain orientation: {Pain Orientation:25161}  PAIN TYPE: {type:313116} Pain description: {PAIN DESCRIPTION:21022940}  Aggravating factors: *** Relieving factors: ***  PRECAUTIONS: None  WEIGHT BEARING RESTRICTIONS No  FALLS:  Has patient fallen in last 6 months? No, Number of falls: 0  LIVING ENVIRONMENT: Lives with: {[facility] lives with:25569::"lives with their family"} Lives in: {Lives in:25570} Stairs: {yes/no:20286}; {Stairs:24000} Has following  equipment at home: {Assistive devices:23999}  OCCUPATION: ***  PLOF: {PLOF:24004}  PATIENT GOALS ***   OBJECTIVE:   DIAGNOSTIC FINDINGS:  ***  PATIENT SURVEYS:  {rehab surveys:24030}  SCREENING FOR RED FLAGS: Bowel or bladder incontinence: No   COGNITION:  Overall cognitive status: Within functional limits for tasks assessed     SENSATION:  Light touch: Appears intact    MUSCLE LENGTH: Hamstrings: Right *** deg; Left *** deg Thomas test: Right *** deg; Left *** deg  POSTURE:  ***  PALPATION: ***  LUMBARAROM/PROM  A/PROM A/PROM  08/11/2021  Flexion   Extension   Right lateral flexion   Left lateral flexion   Right rotation   Left rotation    (Blank rows = not tested)  LE AROM/PROM:  A/PROM Right 08/11/2021 Left 08/11/2021  Hip flexion    Hip extension    Hip abduction    Hip adduction    Hip internal rotation    Hip external rotation    Knee flexion    Knee extension    Ankle dorsiflexion    Ankle plantarflexion    Ankle inversion    Ankle eversion     (Blank rows = not tested)  LE MMT:  MMT Right 08/11/2021 Left 08/11/2021  Hip flexion    Hip extension    Hip abduction    Hip adduction    Hip  internal rotation    Hip external rotation    Knee flexion    Knee extension    Ankle dorsiflexion    Ankle plantarflexion    Ankle inversion    Ankle eversion     (Blank rows = not tested)  LUMBAR SPECIAL TESTS:  {lumbar special test:25242}  FUNCTIONAL TESTS:  {Functional tests:24029}  GAIT: Distance walked: *** Assistive device utilized: {Assistive devices:23999} Level of assistance: {Levels of assistance:24026} Comments: ***    TODAY'S TREATMENT  ***   PATIENT EDUCATION:  Education details: Discussed eval findings, rehab rationale and POC and patient is in agreement  Person educated: Patient Education method: Explanation, Demonstration, and Handouts Education comprehension: verbalized understanding, tactile cues required, and needs further education   HOME EXERCISE PROGRAM: ***  ASSESSMENT:  CLINICAL IMPRESSION: Patient is a *** y.o. *** who was seen today for physical therapy evaluation and treatment for ***.    OBJECTIVE IMPAIRMENTS {opptimpairments:25111}.   ACTIVITY LIMITATIONS {activity limitations:25113}.   PERSONAL FACTORS {Personal factors:25162} are also affecting patient's functional outcome.    REHAB POTENTIAL: {rehabpotential:25112}  CLINICAL DECISION MAKING: {clinical decision making:25114}  EVALUATION COMPLEXITY: {Evaluation complexity:25115}   GOALS: Goals reviewed with patient? Yes  SHORT TERM GOALS:  STG Name Target Date Goal status  1 *** Baseline:  {follow up:25551} {GOALSTATUS:25110}  2 *** Baseline:  {follow up:25551} {GOALSTATUS:25110}  3 *** Baseline: {follow up:25551} {GOALSTATUS:25110}  4 *** Baseline: {follow up:25551} {GOALSTATUS:25110}  5 *** Baseline: {follow up:25551} {GOALSTATUS:25110}  6 *** Baseline: {follow up:25551} {GOALSTATUS:25110}  7 *** Baseline: {follow up:25551} {GOALSTATUS:25110}   LONG TERM GOALS:   LTG Name Target Date Goal status  1 *** Baseline: {follow up:25551} {GOALSTATUS:25110}  2  *** Baseline: {follow up:25551} {GOALSTATUS:25110}  3 *** Baseline: {follow up:25551} {GOALSTATUS:25110}  4 *** Baseline: {follow up:25551} {GOALSTATUS:25110}  5 *** Baseline: {follow up:25551} {GOALSTATUS:25110}  6 *** Baseline: {follow up:25551} {GOALSTATUS:25110}  7 *** Baseline: {follow up:25551} {GOALSTATUS:25110}   PLAN: PT FREQUENCY: {rehab frequency:25116}  PT DURATION: {rehab duration:25117}  PLANNED INTERVENTIONS: {rehab planned interventions:25118::"Therapeutic exercises","Therapeutic activity","Neuro Muscular re-education","Balance training","Gait training","Patient/Family education","Joint mobilization"}  PLAN FOR NEXT SESSION: ***  Hildred Laser, PT 08/11/2021, 4:00 PM

## 2021-08-22 ENCOUNTER — Ambulatory Visit: Payer: Medicaid Other | Admitting: Gastroenterology

## 2021-08-22 NOTE — Progress Notes (Deleted)
? ? ? ? ?Primary Care Physician: Waldon Reining, MD ? ?Primary Gastroenterologist:  Roetta Sessions, MD ? ? ?No chief complaint on file. ? ? ?HPI: Vincent Green is a 26 y.o. male here for follow up to reschedule EGD/colonoscopy. He was last seen in 06/2021. He has h/o dysphagia, GERD with peptic stricture (previously dilated), IDA, constipation, rectal bleeding. Hospitalized in [place] in 04/2021 with N/V, hematemesis and rectal bleeding. Patient ended up with aspiration pneumonia and was intubated. EGD demonstrated severe reflux esophagitis with tight stricture requiring balloon dilation.Small bowel biopsy negative for celiac. Gastric biopsy with mild chronic gastritis bt no H.pylori.  He was advised to return in 4 weeks for repeat session. EGD cancelled last month due to poorly controlled seizures.  ? ?H/o opioid use disorder, takes methadone. Smokes marijuana once every 2-3 months.  ? ?Chronic constipation/obstipation with no prior work up.  ? ?IDA, heme positive stool on DRE last visit.  ? ?Current Outpatient Medications  ?Medication Sig Dispense Refill  ? acetaminophen (TYLENOL) 325 MG tablet Take 2 tablets (650 mg total) by mouth every 4 (four) hours as needed for mild pain, fever or headache. (Patient not taking: Reported on 07/12/2021) 100 tablet 0  ? albuterol (VENTOLIN HFA) 108 (90 Base) MCG/ACT inhaler Inhale 1-2 puffs into the lungs every 6 (six) hours as needed for wheezing or shortness of breath. 1 each 0  ? cholecalciferol (VITAMIN D3) 25 MCG (1000 UNIT) tablet Take 1,000 Units by mouth daily.    ? diazepam (VALIUM) 10 MG tablet Take 10 mg by mouth 2 (two) times daily.    ? diazePAM (VALTOCO 10 MG DOSE NA) Place 1 spray into the nose as needed (seizures).    ? lacosamide (VIMPAT) 200 MG TABS tablet Take 200 mg by mouth 2 (two) times daily.    ? linaclotide (LINZESS) 290 MCG CAPS capsule Take 290 mcg by mouth as directed.    ? lisdexamfetamine (VYVANSE) 60 MG capsule Take 60 mg by mouth in the  morning.    ? methadone (DOLOPHINE) 10 MG/ML solution Take 70 mg by mouth daily.    ? Midazolam (NAYZILAM) 5 MG/0.1ML SOLN Place 1 each into the nose as needed (seizures.).    ? Multiple Vitamin (MULTIVITAMIN WITH MINERALS) TABS tablet Take 1 tablet by mouth daily. (Patient taking differently: Take 1 tablet by mouth daily. Sometimes takes multivitamin) 28 tablet 0  ? naloxone (NARCAN) nasal spray 4 mg/0.1 mL Place 1 spray into the nose as needed (overdose).    ? pregabalin (LYRICA) 200 MG capsule Take 1 capsule (200 mg total) by mouth 3 (three) times daily. 60 capsule 2  ? promethazine (PHENERGAN) 25 MG suppository Place 1 suppository (25 mg total) rectally every 6 (six) hours as needed for refractory nausea / vomiting. (Patient not taking: Reported on 07/12/2021) 12 each 0  ? promethazine (PHENERGAN) 25 MG tablet Take 25 mg by mouth every 6 (six) hours as needed for nausea or vomiting.    ? zolpidem (AMBIEN) 10 MG tablet Take 10 mg by mouth at bedtime.    ? ?No current facility-administered medications for this visit.  ? ? ?Allergies as of 08/22/2021 - Review Complete 07/12/2021  ?Allergen Reaction Noted  ? Tessalon [benzonatate] Swelling 04/28/2016  ? ? ?ROS: ? ?General: Negative for anorexia, weight loss, fever, chills, fatigue, weakness. ?ENT: Negative for hoarseness, difficulty swallowing , nasal congestion. ?CV: Negative for chest pain, angina, palpitations, dyspnea on exertion, peripheral edema.  ?Respiratory: Negative for dyspnea at rest, dyspnea on  exertion, cough, sputum, wheezing.  ?GI: See history of present illness. ?GU:  Negative for dysuria, hematuria, urinary incontinence, urinary frequency, nocturnal urination.  ?Endo: Negative for unusual weight change.  ?  ?Physical Examination: ? ? There were no vitals taken for this visit. ? ?General: Well-nourished, well-developed in no acute distress.  ?Eyes: No icterus. ?Mouth: Oropharyngeal mucosa moist and pink , no lesions erythema or exudate. ?Lungs: Clear  to auscultation bilaterally.  ?Heart: Regular rate and rhythm, no murmurs rubs or gallops.  ?Abdomen: Bowel sounds are normal, nontender, nondistended, no hepatosplenomegaly or masses, no abdominal bruits or hernia , no rebound or guarding.   ?Extremities: No lower extremity edema. No clubbing or deformities. ?Neuro: Alert and oriented x 4   ?Skin: Warm and dry, no jaundice.   ?Psych: Alert and cooperative, normal mood and affect. ? ?Labs:  ?***  ?Imaging Studies: ?No results found. ? ? ?Assessment: ? ? ? ? ?Plan: ? ?

## 2021-08-23 ENCOUNTER — Emergency Department (HOSPITAL_COMMUNITY)
Admission: EM | Admit: 2021-08-23 | Discharge: 2021-08-23 | Disposition: A | Discharge status: Home / Self Care | Payer: Medicaid Other | Attending: Emergency Medicine | Admitting: Emergency Medicine

## 2021-08-23 ENCOUNTER — Encounter (HOSPITAL_COMMUNITY): Payer: Self-pay | Admitting: *Deleted

## 2021-08-23 DIAGNOSIS — R569 Unspecified convulsions: Secondary | ICD-10-CM | POA: Insufficient documentation

## 2021-08-23 NOTE — ED Provider Notes (Signed)
?[facility] EMERGENCY DEPARTMENT ?Provider Note ? ? ?CSN: [id number] ?Arrival date & time: 08/23/21  1746 ? ?  ? ?History ? ?Chief Complaint  ?Patient presents with  ? Withdrawal  ? ? ?Vincent Green is a 26 y.o. male. ? ?HPI ?Patient presents for recent seizures.  He states that he had multiple seizures both earlier today as well as yesterday.  He believes this is due to discontinuing his Vyvanse.  He reports that he was previously on 60 mg of Vyvanse daily and was on this for many years.  He recently discontinued this medication and believes this is the cause of his seizures.  He presents to the ED for request of Vyvanse.  In regards to his antiepileptics, he reports that he has taken multiple different AEDs in the past.  He has discontinued use of these due to unwanted side effects.  He reports he was most recently on Dilantin but has discontinued this medication.  He denies any current symptoms. ?  ? ?Home Medications ?Prior to Admission medications   ?Medication Sig Start Date End Date Taking? Authorizing Provider  ?albuterol (VENTOLIN HFA) 108 (90 Base) MCG/ACT inhaler Inhale 1-2 puffs into the lungs every 6 (six) hours as needed for wheezing or shortness of breath. 05/03/21  Yes Pokhrel, Laxman, MD  ?cholecalciferol (VITAMIN D3) 25 MCG (1000 UNIT) tablet Take 1,000 Units by mouth daily.   Yes [provider]  ?cyclobenzaprine (FLEXERIL) 10 MG tablet Take 10 mg by mouth 3 (three) times daily as needed for muscle spasms.   Yes [provider]  ?diazepam (VALIUM) 10 MG tablet Take 10 mg by mouth 2 (two) times daily.   Yes [provider]  ?linaclotide (LINZESS) 290 MCG CAPS capsule Take 290 mcg by mouth as directed.   Yes [provider]  ?methadone (DOLOPHINE) 10 MG/ML solution Take 70 mg by mouth daily.   Yes [provider]  ?Midazolam (NAYZILAM) 5 MG/0.1ML SOLN Place 1 each into the nose as needed (seizures.).   Yes [provider]  ?naloxone (NARCAN)  nasal spray 4 mg/0.1 mL Place 1 spray into the nose as needed (overdose). 05/24/21  Yes [provider]  ?pregabalin (LYRICA) 200 MG capsule Take 1 capsule (200 mg total) by mouth 3 (three) times daily. 05/03/21  Yes Pokhrel, Laxman, MD  ?promethazine (PHENERGAN) 25 MG tablet Take 25 mg by mouth every 6 (six) hours as needed for nausea or vomiting.   Yes [provider]  ?zolpidem (AMBIEN) 10 MG tablet Take 10 mg by mouth at bedtime.   Yes [provider]  ?acetaminophen (TYLENOL) 325 MG tablet Take 2 tablets (650 mg total) by mouth every 4 (four) hours as needed for mild pain, fever or headache. ?Patient not taking: Reported on 07/12/2021 05/03/21   Joycelyn Das, MD  ?lisdexamfetamine (VYVANSE) 60 MG capsule Take 60 mg by mouth in the morning. ?Patient not taking: Reported on 08/23/2021    [provider]  ?Multiple Vitamin (MULTIVITAMIN WITH MINERALS) TABS tablet Take 1 tablet by mouth daily. ?Patient not taking: Reported on 08/23/2021 05/04/21   Joycelyn Das, MD  ?promethazine (PHENERGAN) 25 MG suppository Place 1 suppository (25 mg total) rectally every 6 (six) hours as needed for refractory nausea / vomiting. ?Patient not taking: Reported on 07/12/2021 05/04/21   Joycelyn Das, MD  ?diphenhydrAMINE (BENADRYL) 25 mg capsule Take 25 mg by mouth every 6 (six) hours as needed.  10/14/19  [provider]  ?fluticasone (FLONASE) 50 MCG/ACT nasal spray Place  1 spray into both nostrils daily. ?Patient not taking: Reported on 03/04/2019 02/10/17 03/13/19  Caccavale, Sophia, PA-C  ?omeprazole (PRILOSEC) 20 MG capsule Take 1 capsule (20 mg total) by mouth 2 (two) times daily before a meal. ?Patient not taking: No sig reported 08/12/19 10/25/20  Tiffany Kocher, PA-C  ?   ? ?Allergies    ?Tessalon [benzonatate]   ? ?Review of Systems   ?Review of Systems  ?Neurological:  Positive for seizures.  ?All other systems reviewed and are negative. ? ?Physical Exam ?Updated Vital Signs ?BP  115/78 (BP Location: Right Arm)   Pulse (!) 116   Temp 99 ?F (37.2 ?C) (Oral)   Resp 17   SpO2 99%  ?Physical Exam ?Vitals and nursing note reviewed.  ?Constitutional:   ?   General: He is not in acute distress. ?   Appearance: He is well-developed and normal weight. He is not toxic-appearing or diaphoretic.  ?HENT:  ?   Head: Normocephalic and atraumatic.  ?   Right Ear: External ear normal.  ?   Left Ear: External ear normal.  ?   Nose: Nose normal.  ?   Mouth/Throat:  ?   Mouth: Mucous membranes are moist.  ?   Pharynx: Oropharynx is clear.  ?   Comments: No tongue bite ?Eyes:  ?   Extraocular Movements: Extraocular movements intact.  ?   Conjunctiva/sclera: Conjunctivae normal.  ?Cardiovascular:  ?   Rate and Rhythm: Normal rate and regular rhythm.  ?   Heart sounds: No murmur heard. ?Pulmonary:  ?   Effort: Pulmonary effort is normal. No respiratory distress.  ?   Breath sounds: Normal breath sounds. No wheezing or rales.  ?Chest:  ?   Chest wall: No tenderness.  ?Abdominal:  ?   Palpations: Abdomen is soft.  ?   Tenderness: There is no abdominal tenderness.  ?Musculoskeletal:     ?   General: No swelling. Normal range of motion.  ?   Cervical back: Normal range of motion and neck supple. No rigidity.  ?Skin: ?   General: Skin is warm and dry.  ?   Capillary Refill: Capillary refill takes less than 2 seconds.  ?   Coloration: Skin is not jaundiced or pale.  ?Neurological:  ?   General: No focal deficit present.  ?   Mental Status: He is alert and oriented to person, place, and time.  ?   Cranial Nerves: No cranial nerve deficit.  ?   Sensory: No sensory deficit.  ?   Motor: No weakness.  ?   Coordination: Coordination normal.  ?Psychiatric:     ?   Mood and Affect: Mood normal.  ? ? ?ED Results / Procedures / Treatments   ?Labs ?(all labs ordered are listed, but only abnormal results are displayed) ?Labs Reviewed - No data to display ? ?EKG ?None ? ?Radiology ?No results found. ? ?Procedures ?Procedures   ? ? ?Medications Ordered in ED ?Medications - No data to display ? ?ED Course/ Medical Decision Making/ A&P ?  ?                        ?Medical Decision Making ? ?Patient is a 26 year old male who presents for reported seizure-like activity both yesterday and today.  He believes this is due to discontinuing his Vyvanse.  He reports that he discontinued his Vyvanse 3 weeks ago.  Prior to that, he was on 60 mg/day for 5 years.  Per chart review, he has a history of ADHD, anxiety, PTSD, seizures.  Per chart review, he was last seen by neurology through telemedicine 2.5 years ago.  This was following an ED visit for seizure-like activity.  At the time, it was suspected that his seizures were secondary to benzodiazepine withdrawal.  At that time, he was taking Depakote.  It is unclear if this was for AED or psychiatric indication.  Per chart review, previous prescriptions have been for Valium, Vyvanse, methadone, and Ambien.  I explained to the patient that Vyvanse is not a antiepileptic medication.  If anything, it lowers seizure threshold.  Additionally, after 3 weeks, he would not have physical withdrawal symptoms.  I also explained to him that long-term medications will need to be prescribed by outpatient providers.  At this point, patient requested narcotic pain medication for a headache that he obtained when he bumped his head on the car window during one of the seizure-like activities.  Patient was offered Tylenol.  He requested Tylenol with codeine and was told that he could get Tylenol only.  He then requested a prescription for phenobarbital.  This request was denied.  I explained the patient that I would go through his chart and see what previous neurology visits have said.  I would also provide him with contact information for neurology follow-up to discuss medications.  After this patient eloped from the ED. ? ? ? ? ? ? ? ?Final Clinical Impression(s) / ED Diagnoses ?Final diagnoses:  ?Seizure-like  activity (HCC)  ? ? ?Rx / DC Orders ?ED Discharge Orders   ? ? None  ? ?  ? ? ?  ?Gloris Manchester, MD ?08/24/21 1527 ? ?

## 2021-08-23 NOTE — ED Triage Notes (Signed)
States he was taking vyvanse and the doctor stopped prescribing the medication, states he has been having withdrawal seizures ?

## 2021-08-26 ENCOUNTER — Ambulatory Visit: Payer: Medicaid Other | Admitting: Internal Medicine

## 2021-08-30 ENCOUNTER — Ambulatory Visit: Payer: Medicaid Other | Admitting: Internal Medicine

## 2021-09-29 ENCOUNTER — Ambulatory Visit: Payer: Medicaid Other | Admitting: Gastroenterology

## 2021-10-07 ENCOUNTER — Telehealth: Payer: Self-pay | Admitting: Internal Medicine

## 2021-10-07 NOTE — Telephone Encounter (Signed)
PATIENT FATHER CALLED ASKING WHEN HIS ENDO AND TCS COULD BE RESCHEDULED.  HIS PAST ONE HAD TO BE PUT OFF DUE TO A SEIZURE   ?

## 2021-10-07 NOTE — Telephone Encounter (Signed)
See prior notes. Pt needs OV prior to being scheduled. He has cancelled several OV's.  ?

## 2021-10-10 ENCOUNTER — Encounter: Payer: Self-pay | Admitting: Internal Medicine

## 2021-10-18 ENCOUNTER — Encounter: Payer: Self-pay | Admitting: Gastroenterology

## 2021-10-18 ENCOUNTER — Ambulatory Visit (INDEPENDENT_AMBULATORY_CARE_PROVIDER_SITE_OTHER): Payer: Medicaid Other | Admitting: Gastroenterology

## 2021-10-18 VITALS — BP 103/68 | HR 103 | Temp 97.8°F | Ht 69.0 in | Wt 156.8 lb

## 2021-10-18 DIAGNOSIS — K222 Esophageal obstruction: Secondary | ICD-10-CM

## 2021-10-18 DIAGNOSIS — K5900 Constipation, unspecified: Secondary | ICD-10-CM

## 2021-10-18 DIAGNOSIS — D509 Iron deficiency anemia, unspecified: Secondary | ICD-10-CM

## 2021-10-18 DIAGNOSIS — K2100 Gastro-esophageal reflux disease with esophagitis, without bleeding: Secondary | ICD-10-CM | POA: Diagnosis not present

## 2021-10-18 DIAGNOSIS — R112 Nausea with vomiting, unspecified: Secondary | ICD-10-CM

## 2021-10-18 MED ORDER — PROMETHAZINE HCL 25 MG RE SUPP: 25.0000 mg | Freq: Four times a day (QID) | RECTAL | 1 refills | Status: DC | PRN

## 2021-10-18 MED ORDER — PANTOPRAZOLE SODIUM 40 MG PO TBEC: 40.0000 mg | DELAYED_RELEASE_TABLET | Freq: Two times a day (BID) | ORAL | 5 refills | Status: DC

## 2021-10-18 NOTE — Progress Notes (Signed)
? ? ? ?GI Office Note   ? ?Referring Provider: Waldon ReiningBrowning, Douglas, MD ?Primary Care Physician:  Waldon ReiningBrowning, Douglas, MD  ?Primary Gastroenterologist: Roetta SessionsMichael Rourk, MD ? ? ?Chief Complaint  ? ?Chief Complaint  ?Patient presents with  ? Follow-up  ? ? ?History of Present Illness  ? ?Vincent Green is a 26 y.o. male presenting today for follow-up.  Patient last seen in January 2023.  History of dysphagia, GERD with peptic stricture previously dilated, IDA, constipation, rectal bleeding.  [place] in [place] in November [place] with vomiting, hematemesis and rectal bleeding.  Ended up with aspiration pneumonia, was intubated.  EGD demonstrated severe reflux esophagitis with tight stricture requiring balloon dilation.  It was recommended he return in 4 weeks for repeat session. Can go 6 to 7 days without a bowel movement.  Obstipation going back to childhood. Required 2 units of packed red blood cells in November 2022. ? ?After last office visit it was recommended he complete EGD with possible esophageal dilation and diagnostic colonoscopy.  Procedure had to be canceled because of issues with seizures and need for adjusting medication. He presents with his father today to discuss setting of EGD.  ? ?Patient states he is average 2-3 seizures per month which is an improvement. Had been having several per week.  He is followed by Dr. Wray Kearnsoonquah/Alisha Powell. Has tried different seizure medications and he has cited different side effects including vomiting/nightmares. He states his weight fluctuates quite a bit. Currently weight is up 17 pounds since 06/2021 but not quite at his more recent baseline of 165 pounds.  ?  ?Continues to have vomiting every day usually within 20 minutes of taking his medications. He wonders if side effect of his seizure medication. No hematemsis. BM small and occurring once every 3-4 days. Recently stools are not hard to pass. Better than in the past, used to go 1-2 weeks without a stool. No  melena, brbpr. In the past he has had rectal bleeding with passage of large stool. When younger, as preteen, he would hold his stool which exacerbated his constipation. He denies abdominal pain. He tolerates applesauce, liquid/soft foods. Lives on protein shakes/Boost.   ? ?Marijuana infrequent, basically non-existent now. None in the past 3-4 months.  ? ?  ?Medications  ? ?Current Outpatient Medications  ?Medication Sig Dispense Refill  ? albuterol (VENTOLIN HFA) 108 (90 Base) MCG/ACT inhaler Inhale 1-2 puffs into the lungs every 6 (six) hours as needed for wheezing or shortness of breath. 1 each 0  ? cholecalciferol (VITAMIN D3) 25 MCG (1000 UNIT) tablet Take 1,000 Units by mouth daily.    ? diazepam (VALIUM) 10 MG tablet Take 10 mg by mouth 2 (two) times daily.    ? linaclotide (LINZESS) 290 MCG CAPS capsule Take 290 mcg by mouth as directed.    ? methadone (DOLOPHINE) 10 MG/ML solution Take 70 mg by mouth daily.    ? Midazolam (NAYZILAM) 5 MG/0.1ML SOLN Place 1 each into the nose as needed (seizures.).    ? Multiple Vitamin (MULTIVITAMIN WITH MINERALS) TABS tablet Take 1 tablet by mouth daily. 28 tablet 0  ? naloxone (NARCAN) nasal spray 4 mg/0.1 mL Place 1 spray into the nose as needed (overdose).    ? pregabalin (LYRICA) 200 MG capsule Take 1 capsule (200 mg total) by mouth 3 (three) times daily. 60 capsule 2  ? promethazine (PHENERGAN) 25 MG suppository Place 1 suppository (25 mg total) rectally every 6 (six) hours as needed for refractory nausea / vomiting.  12 each 0  ? zolpidem (AMBIEN) 10 MG tablet Take 10 mg by mouth at bedtime.    ? ?No current facility-administered medications for this visit.  ? ? ?Allergies  ? ?Allergies as of 10/18/2021 - Review Complete 10/18/2021  ?Allergen Reaction Noted  ? Tessalon [benzonatate] Swelling 04/28/2016  ? ?  ?Past Medical History  ? ?Past Medical History:  ?Diagnosis Date  ? ADHD   ? Anxiety   ? Anxiety   ? Chronic dental pain   ? COPD (chronic obstructive pulmonary  disease) (HCC)   ? Insomnia   ? PTSD (post-traumatic stress disorder)   ? PTSD (post-traumatic stress disorder)   ? Scoliosis   ? Seizures (HCC)   ? 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 Tachycardia   ? ? ?Past Surgical History  ? ?Past Surgical History:  ?Procedure Laterality Date  ? BALLOON DILATION N/A 04/30/2021  ? Procedure: BALLOON DILATION;  Surgeon: Shellia Cleverly, DO;  Location: [facility];  Service: Gastroenterology;  Laterality: N/A;  ? BIOPSY  04/30/2021  ? Procedure: BIOPSY;  Surgeon: Shellia Cleverly, DO;  Location: [facility];  Service: Gastroenterology;;  ? CHOANAL ATRESIA REPAIR    ? ESOPHAGOGASTRODUODENOSCOPY (EGD) WITH PROPOFOL N/A 04/30/2021  ? Procedure: ESOPHAGOGASTRODUODENOSCOPY (EGD) WITH PROPOFOL;  Surgeon: Shellia Cleverly, DO;  Location: [facility];  Service: Gastroenterology;  Laterality: N/A;  ? NOSE SURGERY    ? SKIN GRAFT    ? ? ?Past Family History  ? ?Family History  ?Problem Relation Age of Onset  ? Other Mother 12  ?     04/2019 autopsy pending  ? GER disease Paternal Grandmother   ? Colon cancer Neg Hx   ? ? ?Past Social History  ? ?Social History  ? ?Socioeconomic History  ? Marital status: Single  ?  Spouse name: Not on file  ? Number of children: Not on file  ? Years of education: Not on file  ? Highest education level: Not on file  ?Occupational History  ? Not on file  ?Tobacco Use  ? Smoking status: Every Day  ?  Packs/day: 0.50  ?  Types: Cigarettes  ? Smokeless tobacco: Never  ? Tobacco comments:  ?  smokes 6 cigarettes daily  ?Vaping Use  ? Vaping Use: Former  ?Substance and Sexual Activity  ? Alcohol use: No  ? Drug use: Not Currently  ?  Types: Benzodiazepines, Marijuana  ? Sexual activity: Not Currently  ?Other Topics Concern  ? Not on file  ?Social History Narrative  ? Not on file  ? ?Social Determinants of Health  ? ?Financial Resource Strain: Not on file  ?Food Insecurity: Not on file  ?Transportation Needs: Not on file   ?Physical Activity: Not on file  ?Stress: Not on file  ?Social Connections: Not on file  ?Intimate Partner Violence: Not on file  ? ? ?Review of Systems  ? ?General: Negative for anorexia, weight loss, fever, chills, fatigue, weakness. See hpi ?ENT: Negative for hoarseness, difficulty swallowing , nasal congestion. ?CV: Negative for chest pain, angina, palpitations, dyspnea on exertion, peripheral edema.  ?Respiratory: Negative for dyspnea at rest, dyspnea on exertion, cough, sputum, wheezing.  ?GI: See history of present illness. ?GU:  Negative for dysuria, hematuria, urinary incontinence, urinary frequency, nocturnal urination.  ?Endo: Negative for unusual weight change.  ?   ?Physical Exam  ? ?BP 103/68 (BP Location: Right Arm, Patient Position: Sitting, Cuff Size: Normal)   Pulse (!) 103  Temp 97.8 ?F (36.6 ?C) (Temporal)   Ht 5\' 9"  (1.753 m)   Wt 156 lb 12.8 oz (71.1 kg)   SpO2 98%   BMI 23.16 kg/m?  ?  ?General: Well-nourished, well-developed in no acute distress.  ?Eyes: No icterus. ?Mouth: Oropharyngeal mucosa moist and pink , no lesions erythema or exudate. ?Lungs: Clear to auscultation bilaterally.  ?Heart: Regular rate and rhythm, no murmurs rubs or gallops.  ?Abdomen: Bowel sounds are normal, nontender, nondistended, no hepatosplenomegaly or masses,  ?no abdominal bruits or hernia , no rebound or guarding.  ?Rectal: not performed ?Extremities: No lower extremity edema. No clubbing or deformities. ?Neuro: Alert and oriented x 4   ?Skin: Warm and dry, no jaundice.   ?Psych: Alert and cooperative, normal mood and affect. ? ?Labs  ? ?Lab Results  ?Component Value Date  ? CREATININE 0.72 05/03/2021  ? BUN 15 05/03/2021  ? NA 133 (L) 05/03/2021  ? K 4.7 05/03/2021  ? CL 97 (L) 05/03/2021  ? CO2 27 05/03/2021  ? ?Lab Results  ?Component Value Date  ? ALT 13 05/03/2021  ? AST 14 (L) 05/03/2021  ? ALKPHOS 106 05/03/2021  ? BILITOT 0.5 05/03/2021  ? ?Lab Results  ?Component Value Date  ? WBC 9.5 05/03/2021   ? HGB 8.9 (L) 05/03/2021  ? HCT 31.2 (L) 05/03/2021  ? MCV 82.8 05/03/2021  ? PLT 313 05/03/2021  ? ?Lab Results  ?Component Value Date  ? IRON 23 (L) 04/29/2021  ? FERRITIN 14 (L) 04/30/2021  ? ?Lab Results  ?Com

## 2021-10-18 NOTE — Patient Instructions (Signed)
We will be in touch to schedule upper endoscopy and colonoscopy in near future once I have discussed with anesthesia and Dr. Jena Gauss. ?RX for phenergan suppository sent to [facility]. ?RX for pantoprazole 40mg  twice daily sent to [facility]. This is for acid reflux/inflammed esophagus. I would like you to be on this to add protection to your esophagus given all the vomiting and history of stricture.  ?

## 2021-10-24 ENCOUNTER — Telehealth: Payer: Self-pay | Admitting: *Deleted

## 2021-10-24 ENCOUNTER — Telehealth: Payer: Self-pay

## 2021-10-24 MED ORDER — PROMETHAZINE HCL 25 MG RE SUPP: 25.0000 mg | Freq: Four times a day (QID) | RECTAL | 1 refills | Status: DC | PRN

## 2021-10-24 MED ORDER — ONDANSETRON 4 MG PO TBDP: 4.0000 mg | ORAL_TABLET | Freq: Four times a day (QID) | ORAL | 0 refills | Status: DC | PRN

## 2021-10-24 NOTE — Addendum Note (Signed)
Addended by: Tiffany Kocher on: 10/24/2021 09:10 AM ? ? Modules accepted: Orders ? ?

## 2021-10-24 NOTE — Telephone Encounter (Signed)
Pt was made aware.  

## 2021-10-24 NOTE — Telephone Encounter (Signed)
Called pt and made him aware.  He said the suppositories work better.  I informed him that they are on back order at his pharmacy and this was the substitute.  He informed me that he may call us back to send in a RX for suppositories to a different pharmacy.  ?

## 2021-10-24 NOTE — Telephone Encounter (Signed)
Please let pt know. I will send in zofran ODT, he can dissolve on tongue so hopefully if actively vomiting, this will still be effective.  ?

## 2021-10-24 NOTE — Telephone Encounter (Signed)
There is another telephone note about this today. I sent in dissolvable oral zofran to [facility]. I will leave that RX there if he needs it.  ? ?Will send phenergan supp to [facility]. ?

## 2021-10-24 NOTE — Addendum Note (Signed)
Addended by: Tiffany Kocher on: 10/24/2021 10:10 AM ? ? Modules accepted: Orders ? ?

## 2021-10-24 NOTE — Telephone Encounter (Signed)
Received refill authorization request to change suppository Promethegan 25 mg to tablets.  Suppository on back order.  [facility] ?

## 2021-10-24 NOTE — Telephone Encounter (Signed)
Pt called stating that [facility] is out of the phenergan suppositories that [facility] has them in stock. Pt is requesting his rx to be sent there instead.  ?

## 2021-10-27 ENCOUNTER — Encounter: Payer: Self-pay | Admitting: Gastroenterology

## 2021-10-31 ENCOUNTER — Telehealth: Payer: Self-pay | Admitting: Gastroenterology

## 2021-10-31 ENCOUNTER — Other Ambulatory Visit: Payer: Self-pay

## 2021-10-31 DIAGNOSIS — K625 Hemorrhage of anus and rectum: Secondary | ICD-10-CM

## 2021-10-31 MED ORDER — LINACLOTIDE 290 MCG PO CAPS: 290.0000 ug | ORAL_CAPSULE | Freq: Every day | ORAL | 3 refills | Status: DC

## 2021-10-31 NOTE — Telephone Encounter (Signed)
Both patient and his dad were made aware and verbalized understanding. Labs have been ordered.  ?

## 2021-10-31 NOTE — Telephone Encounter (Signed)
Patient needs to be scheduled for TCS/EGD/ED with RMR. ASA 3. ?Dx: IDA, GERD/esophageal stricture, vomiting, rectal bleeding. ?Recommend 1.5 days of clear liquids before procedure day, irregardless of bowel prep he receives. ? ?-->he needs CBC, ferritin ?-->needs to start Linzess daily as needed to maintain regular stools. He was given instructions to take Linzess before colonoscopy before but I want him to start now. I will send in RX, please let him know. ? ? ?

## 2021-11-01 ENCOUNTER — Encounter: Payer: Self-pay | Admitting: *Deleted

## 2021-11-01 MED ORDER — PEG 3350-KCL-NA BICARB-NACL 420 G PO SOLR: ORAL | 0 refills | Status: DC

## 2021-11-01 NOTE — Telephone Encounter (Signed)
Called pt, LMOVM to call back. 

## 2021-11-01 NOTE — Addendum Note (Signed)
Addended by: Armstead Peaks on: 11/01/2021 10:58 AM ? ? Modules accepted: Orders ? ?

## 2021-11-01 NOTE — Telephone Encounter (Signed)
Pt returned call. He has been scheduled for 6/22 at 10am. Aware will mail prep instructions/pre-op appt (confirmed address), rx prep sent to [facility] per pt request. ?

## 2021-11-17 ENCOUNTER — Other Ambulatory Visit: Payer: Self-pay | Admitting: Gastroenterology

## 2021-11-29 ENCOUNTER — Other Ambulatory Visit: Payer: Self-pay | Admitting: Gastroenterology

## 2021-12-02 NOTE — Patient Instructions (Signed)
Vincent CrampGrayson Green  12/02/2021     @PREFPERIOPPHARMACY @   Your procedure is scheduled on  12/08/2021.   [facility] to [facility] at  0830  A.M.   Call this number if you have problems the morning of surgery:  [phone]   Remember:  Follow the diet and prep instructions given to you by the office.    Use your inhaler before you come and bring your rescue  inhaler with you.     Take these medicines the morning of surgery with A SIP OF WATER         valium, methadone, nayzilan, zofran(if needed), protonix, lyrica.     Do not wear jewelry, make-up or nail polish.  Do not wear lotions, powders, or perfumes, or deodorant.  Do not shave 48 hours prior to surgery.  Men may shave face and neck.  Do not bring valuables to the hospital.  [facility] is not responsible for any belongings or valuables.  Contacts, dentures or bridgework may not be worn into surgery.  Leave your suitcase in the car.  After surgery it may be brought to your room.  For patients admitted to the hospital, discharge time will be determined by your treatment team.  Patients discharged the day of surgery will not be allowed to drive home and must have someone with them for 24 hours.    Special instructions:   DO NOT smoke tobacco or vape for 24 hours before your procedure.  Please read over the following fact sheets that you were given. Anesthesia Post-op Instructions and Care and Recovery After Surgery      Upper Endoscopy, Adult, Care After This sheet gives you information about how to care for yourself after your procedure. Your health care provider may also give you more specific instructions. If you have problems or questions, contact your health care provider. What can I expect after the procedure? After the procedure, it is common to have: A sore throat. Mild stomach pain or discomfort. Bloating. Nausea. Follow these instructions at home:  Follow instructions from your health care  provider about what to eat or drink after your procedure. Return to your normal activities as told by your health care provider. Ask your health care provider what activities are safe for you. Take over-the-counter and prescription medicines only as told by your health care provider. If you were given a sedative during the procedure, it can affect you for several hours. Do not drive or operate machinery until your health care provider says that it is safe. Keep all follow-up visits as told by your health care provider. This is important. Contact a health care provider if you have: A sore throat that lasts longer than one day. Trouble swallowing. Get help right away if: You vomit blood or your vomit looks like coffee grounds. You have: A fever. Bloody, black, or tarry stools. A severe sore throat or you cannot swallow. Difficulty breathing. Severe pain in your chest or abdomen. Summary After the procedure, it is common to have a sore throat, mild stomach discomfort, bloating, and nausea. If you were given a sedative during the procedure, it can affect you for several hours. Do not drive or operate machinery until your health care provider says that it is safe. Follow instructions from your health care provider about what to eat or drink after your procedure. Return to your normal activities as told by your health care provider. This information is not  intended to replace advice given to you by your health care provider. Make sure you discuss any questions you have with your health care provider. Document Revised: 04/11/2019 Document Reviewed: 11/05/2017 Elsevier Patient Education  2023 Elsevier Inc. Esophageal Dilatation Esophageal dilatation, also called esophageal dilation, is a procedure to widen or open a blocked or narrowed part of the esophagus. The esophagus is the part of the body that moves food and liquid from the mouth to the stomach. You may need this procedure if: You have a  buildup of scar tissue in your esophagus that makes it difficult, painful, or impossible to swallow. This can be caused by gastroesophageal reflux disease (GERD). You have cancer of the esophagus. There is a problem with how food moves through your esophagus. In some cases, you may need this procedure repeated at a later time to dilate the esophagus gradually. Tell a health care provider about: Any allergies you have. All medicines you are taking, including vitamins, herbs, eye drops, creams, and over-the-counter medicines. Any problems you or family members have had with anesthetic medicines. Any blood disorders you have. Any surgeries you have had. Any medical conditions you have. Any antibiotic medicines you are required to take before dental procedures. Whether you are pregnant or may be pregnant. What are the risks? Generally, this is a safe procedure. However, problems may occur, including: Bleeding due to a tear in the lining of the esophagus. A hole, or perforation, in the esophagus. What happens before the procedure? Ask your health care provider about: Changing or stopping your regular medicines. This is especially important if you are taking diabetes medicines or blood thinners. Taking medicines such as aspirin and ibuprofen. These medicines can thin your blood. Do not take these medicines unless your health care provider tells you to take them. Taking over-the-counter medicines, vitamins, herbs, and supplements. Follow instructions from your health care provider about eating or drinking restrictions. Plan to have a responsible adult take you home from the hospital or clinic. Plan to have a responsible adult care for you for the time you are told after you leave the hospital or clinic. This is important. What happens during the procedure? You may be given a medicine to help you relax (sedative). A numbing medicine may be sprayed into the back of your throat, or you may gargle  the medicine. Your health care provider may perform the dilatation using various surgical instruments, such as: Simple dilators. This instrument is carefully placed in the esophagus to stretch it. Guided wire bougies. This involves using an endoscope to insert a wire into the esophagus. A dilator is passed over this wire to enlarge the esophagus. Then the wire is removed. Balloon dilators. An endoscope with a small balloon is inserted into the esophagus. The balloon is inflated to stretch the esophagus and open it up. The procedure may vary among health care providers and hospitals. What can I expect after the procedure? Your blood pressure, heart rate, breathing rate, and blood oxygen level will be monitored until you leave the hospital or clinic. Your throat may feel slightly sore and numb. This will get better over time. You will not be allowed to eat or drink until your throat is no longer numb. When you are able to drink, urinate, and sit on the edge of the bed without nausea or dizziness, you may be able to return home. Follow these instructions at home: Take over-the-counter and prescription medicines only as told by your health care provider. If you  were given a sedative during the procedure, it can affect you for several hours. Do not drive or operate machinery until your health care provider says that it is safe. Plan to have a responsible adult care for you for the time you are told. This is important. Follow instructions from your health care provider about any eating or drinking restrictions. Do not use any products that contain nicotine or tobacco, such as cigarettes, e-cigarettes, and chewing tobacco. If you need help quitting, ask your health care provider. Keep all follow-up visits. This is important. Contact a health care provider if: You have a fever. You have pain that is not relieved by medicine. Get help right away if: You have chest pain. You have trouble breathing. You  have trouble swallowing. You vomit blood. You have black, tarry, or bloody stools. These symptoms may represent a serious problem that is an emergency. Do not wait to see if the symptoms will go away. Get medical help right away. Call your local emergency services (911 in the [place] Do not drive yourself to the hospital. Summary Esophageal dilatation, also called esophageal dilation, is a procedure to widen or open a blocked or narrowed part of the esophagus. Plan to have a responsible adult take you home from the hospital or clinic. For this procedure, a numbing medicine may be sprayed into the back of your throat, or you may gargle the medicine. Do not drive or operate machinery until your health care provider says that it is safe. This information is not intended to replace advice given to you by your health care provider. Make sure you discuss any questions you have with your health care provider. Document Revised: 10/22/2019 Document Reviewed: 10/22/2019 Elsevier Patient Education  2023 Elsevier Inc. Colonoscopy, Adult, Care After The following information offers guidance on how to care for yourself after your procedure. Your health care provider may also give you more specific instructions. If you have problems or questions, contact your health care provider. What can I expect after the procedure? After the procedure, it is common to have: A small amount of blood in your stool for 24 hours after the procedure. Some gas. Mild cramping or bloating of your abdomen. Follow these instructions at home: Eating and drinking  Drink enough fluid to keep your urine pale yellow. Follow instructions from your health care provider about eating or drinking restrictions. Resume your normal diet as told by your health care provider. Avoid heavy or fried foods that are hard to digest. Activity Rest as told by your health care provider. Avoid sitting for a long time without moving. Get up to take  short walks every 1-2 hours. This is important to improve blood flow and breathing. Ask for help if you feel weak or unsteady. Return to your normal activities as told by your health care provider. Ask your health care provider what activities are safe for you. Managing cramping and bloating  Try walking around when you have cramps or feel bloated. If directed, apply heat to your abdomen as told by your health care provider. Use the heat source that your health care provider recommends, such as a moist heat pack or a heating pad. Place a towel between your skin and the heat source. Leave the heat on for 20-30 minutes. Remove the heat if your skin turns bright red. This is especially important if you are unable to feel pain, heat, or cold. You have a greater risk of getting burned. General instructions If you were given  a sedative during the procedure, it can affect you for several hours. Do not drive or operate machinery until your health care provider says that it is safe. For the first 24 hours after the procedure: Do not sign important documents. Do not drink alcohol. Do your regular daily activities at a slower pace than normal. Eat soft foods that are easy to digest. Take over-the-counter and prescription medicines only as told by your health care provider. Keep all follow-up visits. This is important. Contact a health care provider if: You have blood in your stool 2-3 days after the procedure. Get help right away if: You have more than a small spotting of blood in your stool. You have large blood clots in your stool. You have swelling of your abdomen. You have nausea or vomiting. You have a fever. You have increasing pain in your abdomen that is not relieved with medicine. These symptoms may be an emergency. Get help right away. Call 911. Do not wait to see if the symptoms will go away. Do not drive yourself to the hospital. Summary After the procedure, it is common to have a  small amount of blood in your stool. You may also have mild cramping and bloating of your abdomen. If you were given a sedative during the procedure, it can affect you for several hours. Do not drive or operate machinery until your health care provider says that it is safe. Get help right away if you have a lot of blood in your stool, nausea or vomiting, a fever, or increased pain in your abdomen. This information is not intended to replace advice given to you by your health care provider. Make sure you discuss any questions you have with your health care provider. Document Revised: 01/26/2021 Document Reviewed: 01/26/2021 Elsevier Patient Education  2023 Elsevier Inc. Monitored Anesthesia Care, Care After This sheet gives you information about how to care for yourself after your procedure. Your health care provider may also give you more specific instructions. If you have problems or questions, contact your health care provider. What can I expect after the procedure? After the procedure, it is common to have: Tiredness. Forgetfulness about what happened after the procedure. Impaired judgment for important decisions. Nausea or vomiting. Some difficulty with balance. Follow these instructions at home: For the time period you were told by your health care provider:     Rest as needed. Do not participate in activities where you could fall or become injured. Do not drive or use machinery. Do not drink alcohol. Do not take sleeping pills or medicines that cause drowsiness. Do not make important decisions or sign legal documents. Do not take care of children on your own. Eating and drinking Follow the diet that is recommended by your health care provider. Drink enough fluid to keep your urine pale yellow. If you vomit: Drink water, juice, or soup when you can drink without vomiting. Make sure you have little or no nausea before eating solid foods. General instructions Have a responsible  adult stay with you for the time you are told. It is important to have someone help care for you until you are awake and alert. Take over-the-counter and prescription medicines only as told by your health care provider. If you have sleep apnea, surgery and certain medicines can increase your risk for breathing problems. Follow instructions from your health care provider about wearing your sleep device: Anytime you are sleeping, including during daytime naps. While taking prescription pain medicines, sleeping medicines, or  medicines that make you drowsy. Avoid smoking. Keep all follow-up visits as told by your health care provider. This is important. Contact a health care provider if: You keep feeling nauseous or you keep vomiting. You feel light-headed. You are still sleepy or having trouble with balance after 24 hours. You develop a rash. You have a fever. You have redness or swelling around the IV site. Get help right away if: You have trouble breathing. You have new-onset confusion at home. Summary For several hours after your procedure, you may feel tired. You may also be forgetful and have poor judgment. Have a responsible adult stay with you for the time you are told. It is important to have someone help care for you until you are awake and alert. Rest as told. Do not drive or operate machinery. Do not drink alcohol or take sleeping pills. Get help right away if you have trouble breathing, or if you suddenly become confused. This information is not intended to replace advice given to you by your health care provider. Make sure you discuss any questions you have with your health care provider. Document Revised: 05/10/2021 Document Reviewed: 05/08/2019 Elsevier Patient Education  2023 [employer].

## 2021-12-06 ENCOUNTER — Encounter (HOSPITAL_COMMUNITY)
Admission: RE | Admit: 2021-12-06 | Discharge: 2021-12-06 | Disposition: A | Discharge status: Home / Self Care | Payer: Medicaid Other | Source: Ambulatory Visit | Attending: Internal Medicine | Admitting: Internal Medicine

## 2021-12-06 DIAGNOSIS — D509 Iron deficiency anemia, unspecified: Secondary | ICD-10-CM

## 2021-12-07 ENCOUNTER — Encounter (HOSPITAL_COMMUNITY)
Admission: RE | Admit: 2021-12-07 | Discharge: 2021-12-07 | Disposition: A | Discharge status: Home / Self Care | Payer: Medicaid Other | Source: Ambulatory Visit | Attending: Internal Medicine | Admitting: Internal Medicine

## 2021-12-07 ENCOUNTER — Other Ambulatory Visit: Payer: Self-pay | Admitting: Gastroenterology

## 2021-12-07 ENCOUNTER — Encounter (HOSPITAL_COMMUNITY): Payer: Self-pay | Admitting: Anesthesiology

## 2021-12-07 DIAGNOSIS — Z01812 Encounter for preprocedural laboratory examination: Secondary | ICD-10-CM | POA: Diagnosis present

## 2021-12-07 DIAGNOSIS — D509 Iron deficiency anemia, unspecified: Secondary | ICD-10-CM | POA: Insufficient documentation

## 2021-12-07 LAB — CBC WITH DIFFERENTIAL/PLATELET
Abs Immature Granulocytes: 0.02 10*3/uL (ref 0.00–0.07)
Basophils Absolute: 0 10*3/uL (ref 0.0–0.1)
Basophils Relative: 0 %
Eosinophils Absolute: 0.1 10*3/uL (ref 0.0–0.5)
Eosinophils Relative: 2 %
HCT: 37.5 % — ABNORMAL LOW (ref 39.0–52.0)
Hemoglobin: 11.9 g/dL — ABNORMAL LOW (ref 13.0–17.0)
Immature Granulocytes: 0 %
Lymphocytes Relative: 29 %
Lymphs Abs: 2 10*3/uL (ref 0.7–4.0)
MCH: 27.4 pg (ref 26.0–34.0)
MCHC: 31.7 g/dL (ref 30.0–36.0)
MCV: 86.2 fL (ref 80.0–100.0)
Monocytes Absolute: 0.8 10*3/uL (ref 0.1–1.0)
Monocytes Relative: 12 %
Neutro Abs: 3.8 10*3/uL (ref 1.7–7.7)
Neutrophils Relative %: 57 %
Platelets: 235 10*3/uL (ref 150–400)
RBC: 4.35 MIL/uL (ref 4.22–5.81)
RDW: 16 % — ABNORMAL HIGH (ref 11.5–15.5)
WBC: 6.8 10*3/uL (ref 4.0–10.5)
nRBC: 0 % (ref 0.0–0.2)

## 2021-12-07 LAB — FERRITIN: Ferritin: 14 ng/mL — ABNORMAL LOW (ref 24–336)

## 2021-12-08 ENCOUNTER — Encounter (HOSPITAL_COMMUNITY): Admission: RE | Payer: Self-pay | Source: Home / Self Care

## 2021-12-08 ENCOUNTER — Ambulatory Visit (HOSPITAL_COMMUNITY): Admission: RE | Admit: 2021-12-08 | Payer: Medicaid Other | Source: Home / Self Care | Admitting: Internal Medicine

## 2021-12-08 ENCOUNTER — Other Ambulatory Visit: Payer: Self-pay | Admitting: Gastroenterology

## 2021-12-08 ENCOUNTER — Telehealth: Payer: Self-pay | Admitting: *Deleted

## 2021-12-08 DIAGNOSIS — J9601 Acute respiratory failure with hypoxia: Secondary | ICD-10-CM

## 2021-12-08 DIAGNOSIS — K625 Hemorrhage of anus and rectum: Secondary | ICD-10-CM

## 2021-12-08 DIAGNOSIS — K2970 Gastritis, unspecified, without bleeding: Secondary | ICD-10-CM

## 2021-12-08 DIAGNOSIS — D509 Iron deficiency anemia, unspecified: Secondary | ICD-10-CM

## 2021-12-08 DIAGNOSIS — E440 Moderate protein-calorie malnutrition: Secondary | ICD-10-CM

## 2021-12-08 DIAGNOSIS — R112 Nausea with vomiting, unspecified: Secondary | ICD-10-CM

## 2021-12-08 DIAGNOSIS — R092 Respiratory arrest: Secondary | ICD-10-CM

## 2021-12-08 DIAGNOSIS — K209 Esophagitis, unspecified without bleeding: Secondary | ICD-10-CM

## 2021-12-08 DIAGNOSIS — K222 Esophageal obstruction: Secondary | ICD-10-CM

## 2021-12-08 DIAGNOSIS — R1011 Right upper quadrant pain: Secondary | ICD-10-CM

## 2021-12-08 DIAGNOSIS — K5900 Constipation, unspecified: Secondary | ICD-10-CM

## 2021-12-08 DIAGNOSIS — R1319 Other dysphagia: Secondary | ICD-10-CM

## 2021-12-08 DIAGNOSIS — F4310 Post-traumatic stress disorder, unspecified: Secondary | ICD-10-CM

## 2021-12-08 DIAGNOSIS — K219 Gastro-esophageal reflux disease without esophagitis: Secondary | ICD-10-CM

## 2021-12-08 SURGERY — COLONOSCOPY WITH PROPOFOL
Anesthesia: Monitor Anesthesia Care

## 2021-12-08 NOTE — Telephone Encounter (Signed)
Received VM from pt that he needs to cancel procedure scheduled with Dr. Jena Gauss today.  Called pt, LMOVM.

## 2021-12-14 NOTE — Telephone Encounter (Signed)
Called pt, VM now full and unable to leave a message.  FYI to leslie and Dr. Jena Gauss I have not been able to reach patient/pt hasn't returned calls.

## 2021-12-30 ENCOUNTER — Ambulatory Visit: Payer: Medicaid Other | Admitting: Gastroenterology

## 2022-01-05 ENCOUNTER — Telehealth: Payer: Self-pay | Admitting: *Deleted

## 2022-01-05 ENCOUNTER — Other Ambulatory Visit: Payer: Self-pay | Admitting: Gastroenterology

## 2022-01-05 ENCOUNTER — Telehealth: Payer: Self-pay

## 2022-01-05 DIAGNOSIS — R112 Nausea with vomiting, unspecified: Secondary | ICD-10-CM

## 2022-01-05 MED ORDER — PROMETHAZINE HCL 25 MG RE SUPP: RECTAL | 1 refills | Status: DC

## 2022-01-05 NOTE — Telephone Encounter (Signed)
Received VM from pt father requesting to reschedule patient procedure from 6/22. Dr. Jena Gauss, does patient need another OV to r/s for double procedure? Thanks

## 2022-01-05 NOTE — Telephone Encounter (Signed)
Pt is requesting refills on phenergan suppositories. Last ov was 10/18/2021 with Verlon Au.

## 2022-01-05 NOTE — Telephone Encounter (Signed)
Pt's dad was made aware

## 2022-01-06 NOTE — Telephone Encounter (Signed)
Please schedule OV with [facility] per Dr. Jena Gauss

## 2022-01-09 ENCOUNTER — Other Ambulatory Visit: Payer: Self-pay | Admitting: Gastroenterology

## 2022-01-09 ENCOUNTER — Encounter: Payer: Self-pay | Admitting: Internal Medicine

## 2022-01-09 DIAGNOSIS — R112 Nausea with vomiting, unspecified: Secondary | ICD-10-CM

## 2022-03-05 NOTE — Progress Notes (Deleted)
GI Office Note    Referring Provider: Waldon Reining, MD Primary Care Physician:  Waldon Reining, MD  Primary Gastroenterologist: Roetta Sessions, MD   Chief Complaint   No chief complaint on file.    History of Present Illness   Vincent Green is a 26 y.o. male presenting today          Medications   Current Outpatient Medications  Medication Sig Dispense Refill   albuterol (VENTOLIN HFA) 108 (90 Base) MCG/ACT inhaler Inhale 1-2 puffs into the lungs every 6 (six) hours as needed for wheezing or shortness of breath. 1 each 0   cholecalciferol (VITAMIN D3) 25 MCG (1000 UNIT) tablet Take 1,000 Units by mouth daily.     diazepam (VALIUM) 10 MG tablet Take 10 mg by mouth 2 (two) times daily.     linaclotide (LINZESS) 290 MCG CAPS capsule Take 1 capsule (290 mcg total) by mouth daily before breakfast. If more than 4 stools in a 24 hours period, hold Linzess for a day. 30 capsule 3   methadone (DOLOPHINE) 10 MG/ML solution Take 70 mg by mouth daily.     Midazolam (NAYZILAM) 5 MG/0.1ML SOLN Place 1 each into the nose as needed (seizures.).     Multiple Vitamin (MULTIVITAMIN WITH MINERALS) TABS tablet Take 1 tablet by mouth daily. 28 tablet 0   naloxone (NARCAN) nasal spray 4 mg/0.1 mL Place 1 spray into the nose as needed (overdose).     ondansetron (ZOFRAN-ODT) 4 MG disintegrating tablet Take 1 tablet (4 mg total) by mouth every 6 (six) hours as needed for nausea or vomiting. 30 tablet 0   pantoprazole (PROTONIX) 40 MG tablet Take 1 tablet (40 mg total) by mouth 2 (two) times daily before a meal. 60 tablet 5   polyethylene glycol-electrolytes (NULYTELY) 420 g solution As directed 4000 mL 0   pregabalin (LYRICA) 200 MG capsule Take 1 capsule (200 mg total) by mouth 3 (three) times daily. 60 capsule 2   promethazine (PROMETHEGAN) 25 MG suppository INSERT ONE SUPPOSITORY RECTALLY EVERY 6 HOURS AS NEEDED FOR NAUSEA AND VOMITING 12 suppository 1   zolpidem (AMBIEN) 10 MG  tablet Take 10 mg by mouth at bedtime.     No current facility-administered medications for this visit.    Allergies   Allergies as of 03/06/2022 - Review Complete 12/07/2021  Allergen Reaction Noted   Tessalon [benzonatate] Swelling 04/28/2016    Past Medical History   Past Medical History:  Diagnosis Date   ADHD    Anxiety    Anxiety    Chronic dental pain    COPD (chronic obstructive pulmonary disease) (HCC)    ??   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 (documented 08/12/19)   Tachycardia     Past Surgical History   Past Surgical History:  Procedure Laterality Date   BALLOON DILATION N/A 04/30/2021   Procedure: BALLOON DILATION;  Surgeon: Shellia Cleverly, DO;  Location: [facility];  Service: Gastroenterology;  Laterality: N/A;   BIOPSY  04/30/2021   Procedure: BIOPSY;  Surgeon: Shellia Cleverly, DO;  Location: [facility];  Service: Gastroenterology;;   CHOANAL ATRESIA REPAIR     ESOPHAGOGASTRODUODENOSCOPY (EGD) WITH PROPOFOL N/A 04/30/2021   Procedure: ESOPHAGOGASTRODUODENOSCOPY (EGD) WITH PROPOFOL;  Surgeon: Shellia Cleverly, DO;  Location: [facility];  Service: Gastroenterology;  Laterality: N/A;  NOSE SURGERY     SKIN GRAFT      Past Family History   Family History  Problem Relation Age of Onset   Other Mother 71       04/2019 autopsy pending   GER disease Paternal Grandmother    Colon cancer Neg Hx     Past Social History   Social History   Socioeconomic History   Marital status: Single    Spouse name: Not on file   Number of children: Not on file   Years of education: Not on file   Highest education level: Not on file  Occupational History   Not on file  Tobacco Use   Smoking status: Every Day    Packs/day: 0.50    Types: Cigarettes   Smokeless tobacco: Never   Tobacco comments:    smokes 6  cigarettes daily  Vaping Use   Vaping Use: Former  Substance and Sexual Activity   Alcohol use: No   Drug use: Not Currently    Types: Benzodiazepines, Marijuana   Sexual activity: Not Currently  Other Topics Concern   Not on file  Social History Narrative   Not on file   Social Determinants of Health   Financial Resource Strain: Not on file  Food Insecurity: Not on file  Transportation Needs: Not on file  Physical Activity: Not on file  Stress: Not on file  Social Connections: Not on file  Intimate Partner Violence: Not on file    Review of Systems   General: Negative for anorexia, weight loss, fever, chills, fatigue, weakness. Eyes: Negative for vision changes.  ENT: Negative for hoarseness, difficulty swallowing , nasal congestion. CV: Negative for chest pain, angina, palpitations, dyspnea on exertion, peripheral edema.  Respiratory: Negative for dyspnea at rest, dyspnea on exertion, cough, sputum, wheezing.  GI: See history of present illness. GU:  Negative for dysuria, hematuria, urinary incontinence, urinary frequency, nocturnal urination.  MS: Negative for joint pain, low back pain.  Derm: Negative for rash or itching.  Neuro: Negative for weakness, abnormal sensation, seizure, frequent headaches, memory loss,  confusion.  Psych: Negative for anxiety, depression, suicidal ideation, hallucinations.  Endo: Negative for unusual weight change.  Heme: Negative for bruising or bleeding. Allergy: Negative for rash or hives.  Physical Exam   There were no vitals taken for this visit.   General: Well-nourished, well-developed in no acute distress.  Head: Normocephalic, atraumatic.   Eyes: Conjunctiva pink, no icterus. Mouth: Oropharyngeal mucosa moist and pink , no lesions erythema or exudate. Neck: Supple without thyromegaly, masses, or lymphadenopathy.  Lungs: Clear to auscultation bilaterally.  Heart: Regular rate and rhythm, no murmurs rubs or gallops.  Abdomen:  Bowel sounds are normal, nontender, nondistended, no hepatosplenomegaly or masses,  no abdominal bruits or hernia, no rebound or guarding.   Rectal: *** Extremities: No lower extremity edema. No clubbing or deformities.  Neuro: Alert and oriented x 4 , grossly normal neurologically.  Skin: Warm and dry, no rash or jaundice.   Psych: Alert and cooperative, normal mood and affect.  Labs   *** Imaging Studies   No results found.  Assessment       PLAN   ***   Laureen Ochs. Bobby Rumpf, [place], [facility]

## 2022-03-06 ENCOUNTER — Telehealth: Payer: Self-pay | Admitting: Gastroenterology

## 2022-03-06 ENCOUNTER — Ambulatory Visit: Payer: Medicaid Other | Admitting: Gastroenterology

## 2022-03-06 NOTE — Telephone Encounter (Signed)
Patient has had another no show. He cancelled his procedures 12/08/21 same day. Numerous cancelled appointments in the office. We have been trying to get EGD/colonoscopy completed since 06/2021. Once it was cancelled due to seizures but their has been a compliance issues since then.   Dr. Gala Romney and Leonia Reeves, consider discharge letter.

## 2022-03-08 NOTE — Telephone Encounter (Signed)
Vincent Green. Plans for D/C.   Reba, can you send discharge letter?

## 2022-03-20 ENCOUNTER — Encounter: Payer: Self-pay | Admitting: Neurology

## 2022-04-20 ENCOUNTER — Encounter: Payer: Self-pay | Admitting: Neurology

## 2022-04-20 ENCOUNTER — Ambulatory Visit: Payer: Medicaid Other | Admitting: Neurology

## 2022-04-20 DIAGNOSIS — Z029 Encounter for administrative examinations, unspecified: Secondary | ICD-10-CM

## 2022-04-26 ENCOUNTER — Encounter: Payer: Self-pay | Admitting: Neurology

## 2022-05-05 IMAGING — CT CT HEAD W/O CM
4 series · 16 of 47 positions shown, 18 images · non-contrast
Comparison: Prior head CT examinations 04/24/2021 and earlier.
Brain MRI 04/22/2019.

CLINICAL DATA: Facial trauma, fall.

EXAM:
CT HEAD WITHOUT CONTRAST
TECHNIQUE: Contiguous axial images were obtained from the base of the skull
through the vertex without intravenous contrast.

[Series 3: head without · axial · non-contrast · 0.43mm/px · z∈[-68,+47]mm · 7 of 31 slices shown, 9 images]
[im 4/31  brain]
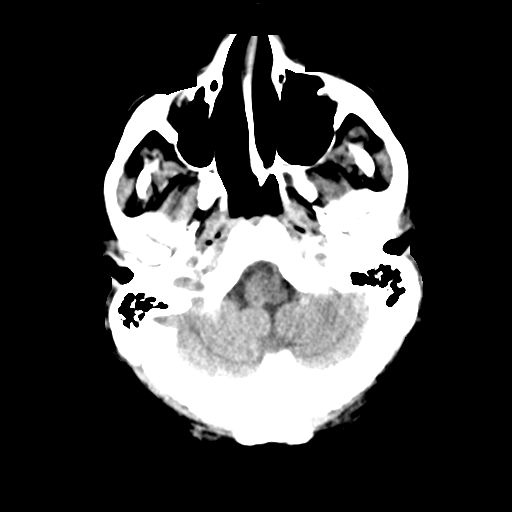
[im 4/31  bone]
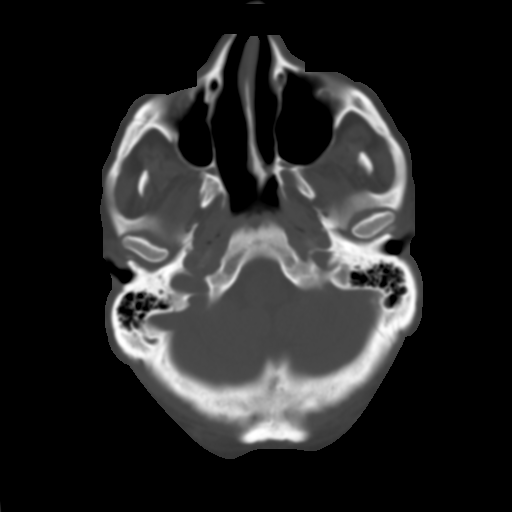
[im 8/31  brain]
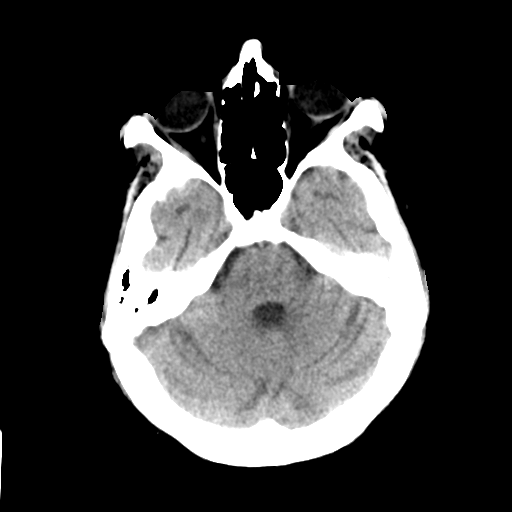
[im 12/31  brain]
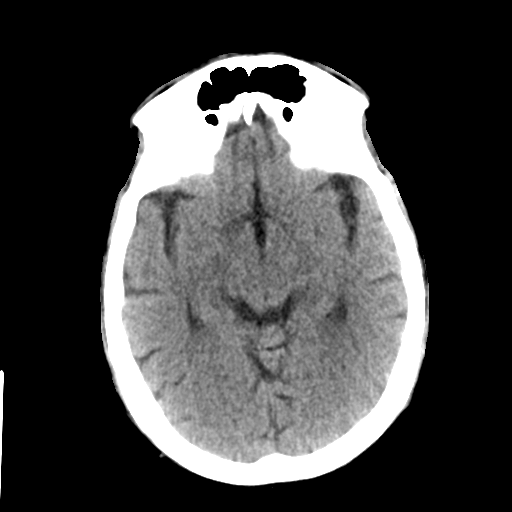
[im 16/31  brain]
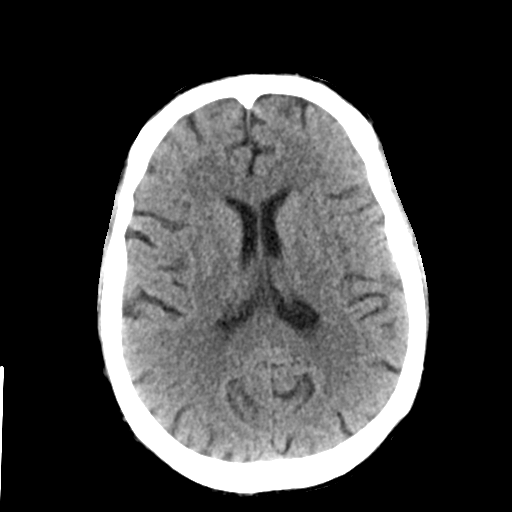
[im 19/31  brain]
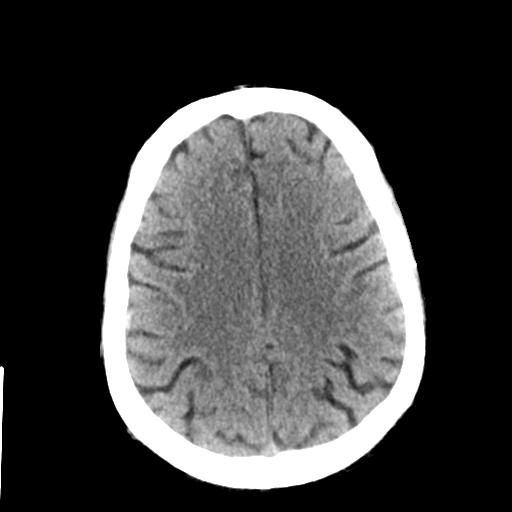
[im 19/31  bone]
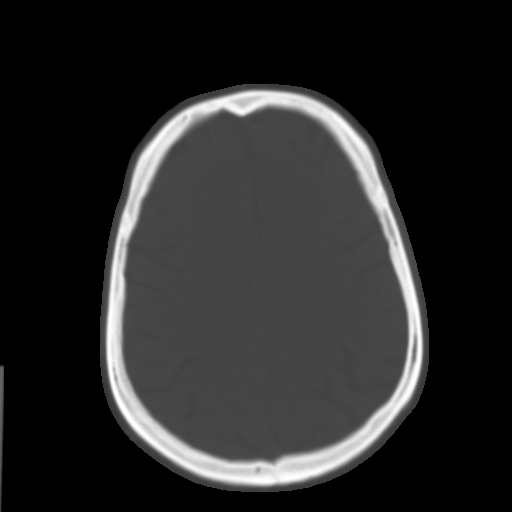
[im 23/31  brain]
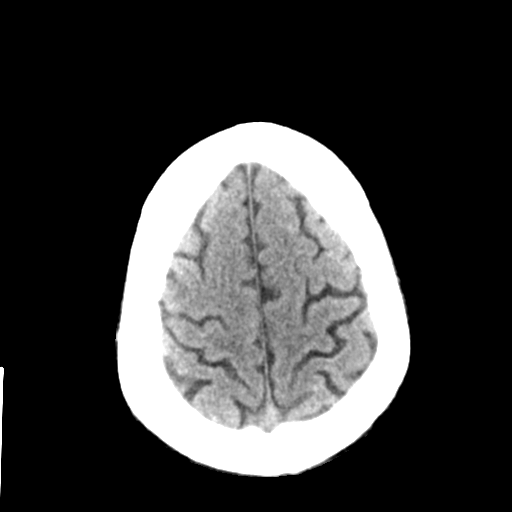
[im 27/31  brain]
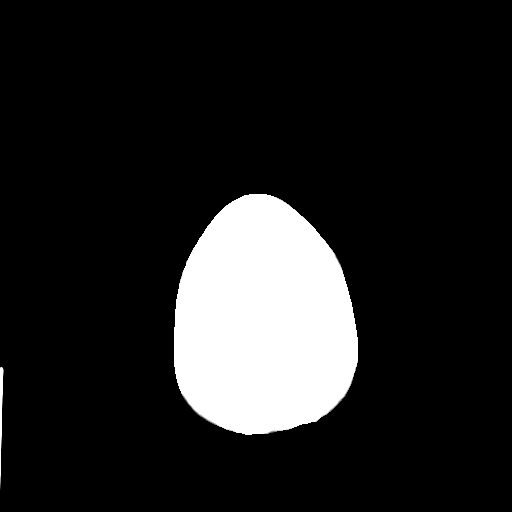

[Series 4: head bone · axial · 0.43mm/px · z∈[-69,-39]mm · 3 of 77 slices shown]
[im 8/77  bone]
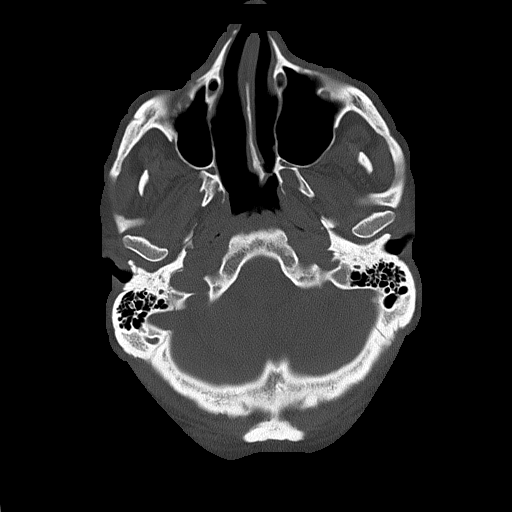
[im 16/77  bone]
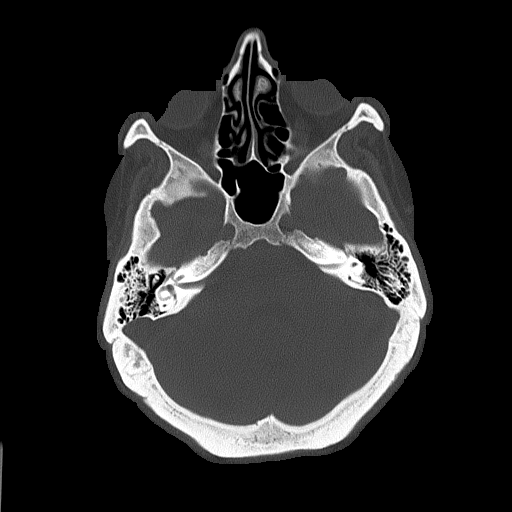
[im 23/77  bone]
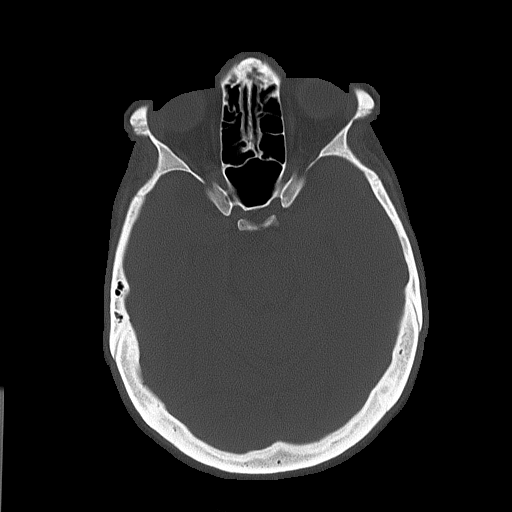

[Series 5: head without cor · coronal · non-contrast · 0.32mm/px · 3 of 65 slices shown]
[im 23/65  brain]
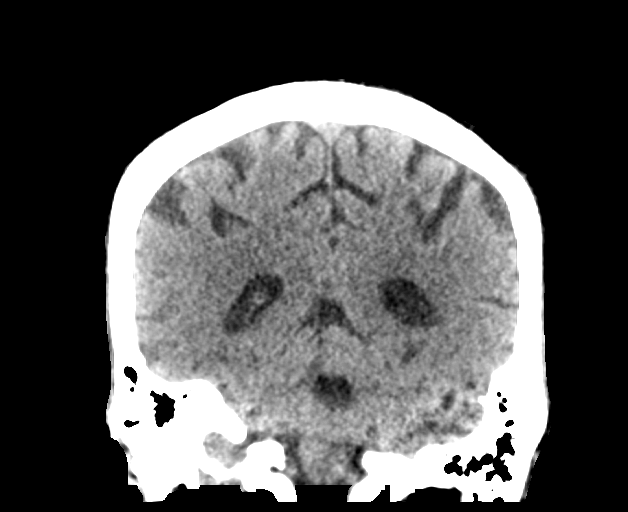
[im 29/65  brain]
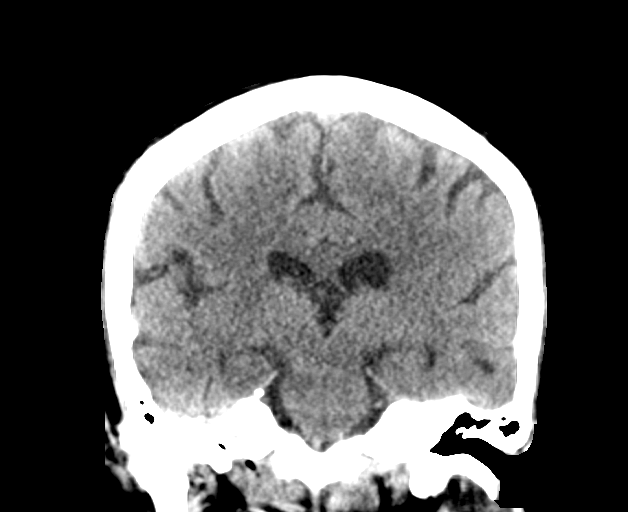
[im 36/65  brain]
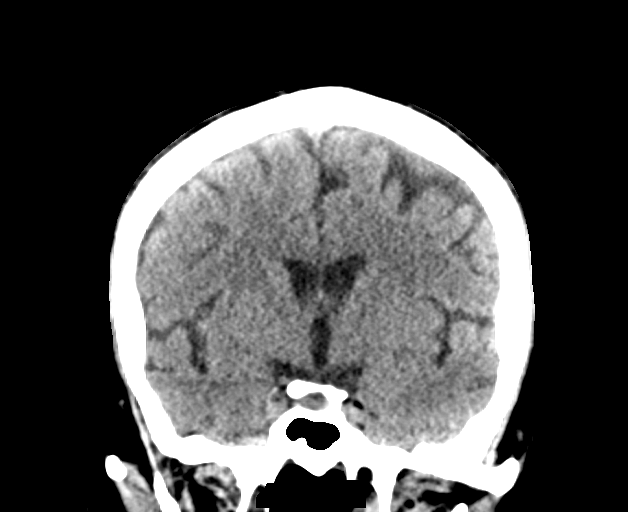

[Series 6: head without sag · sagittal · non-contrast · 0.30mm/px · 3 of 48 slices shown]
[im 16/48  brain]
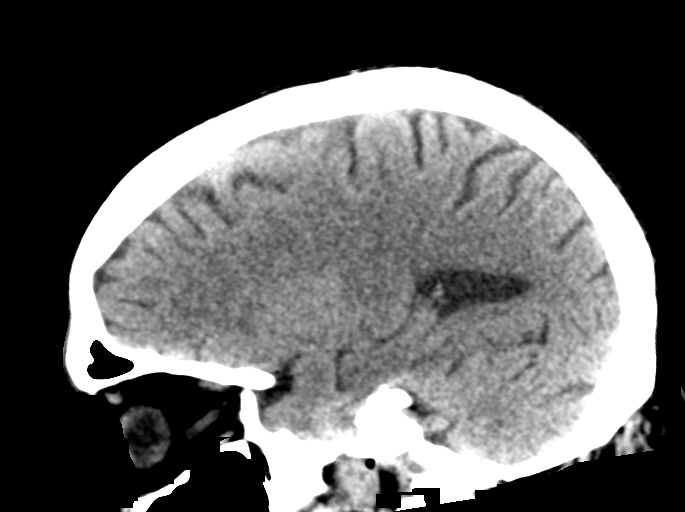
[im 24/48  brain]
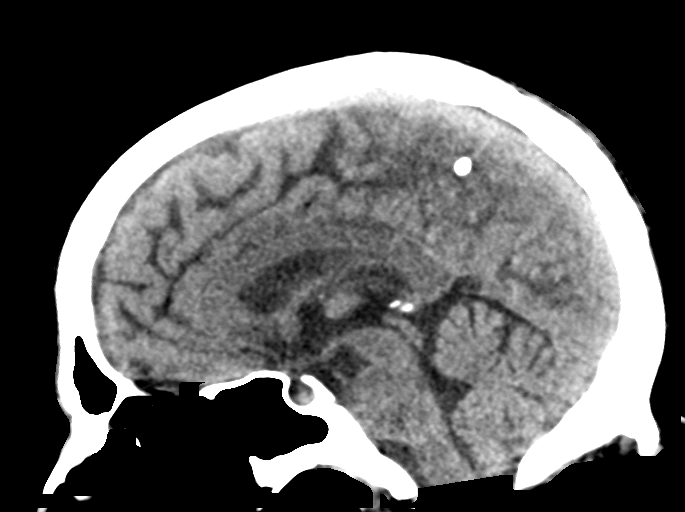
[im 32/48  brain]
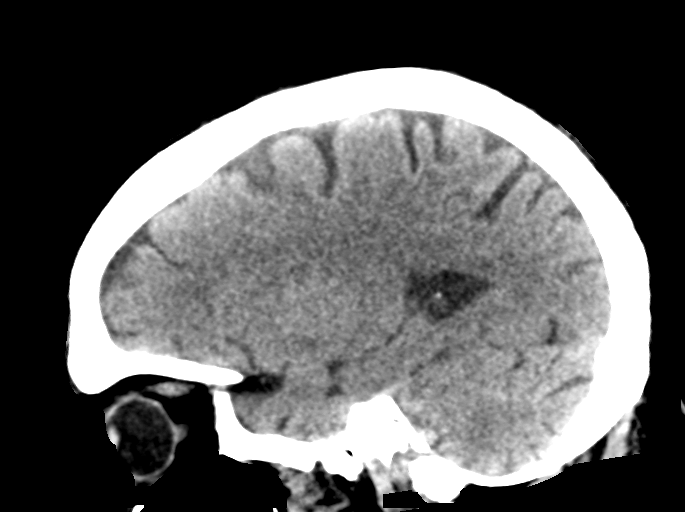

[16 of 47 positions shown; findings below may reference images not displayed]

FINDINGS: Brain:

Mild generalized parenchymal atrophy, advanced for age.

There is no acute intracranial hemorrhage.

No demarcated cortical infarct.

No extra-axial fluid collection.

No evidence of an intracranial mass.

No midline shift.

Vascular: No hyperdense vessel.

Skull: Normal. Negative for fracture or focal lesion.

Sinuses/Orbits: Visualized orbits show no acute finding. No
significant paranasal sinus disease at the imaged levels.
IMPRESSION: No evidence of acute intracranial abnormality.

Mild generalized parenchymal atrophy, advanced for age.

## 2022-05-09 ENCOUNTER — Encounter: Payer: Self-pay | Admitting: *Deleted

## 2022-05-09 NOTE — Progress Notes (Signed)
May 09, 2022  Daivd Fredericksen [address] [place] [address]   Dear Mr.  Bramhall,  We find it necessary to inform you that [facility] AT [facility] [address] this also includes [facility] at [address] also.  All providers practicing at this clinics will no longer be able to provide medical care to you.  We believe that our patient/provider relationship has been compromised and thus would request that you seek care elsewhere.  We urge you to find another provider to attend to your medical care needs immediately.  You may want to call the local medical society or [facility] (referral service [phone]) for assistance in obtaining a new provider.  Copies of your medical records will be provided to your new provider upon your request.  We will provide for you urgent medical care needs for 30 days from the date of this notice.   If you experience a medical emergency at any time, please go directly to the nearest emergency department.   Respectfully,  Merian Capron [place]

## 2022-05-17 ENCOUNTER — Telehealth: Payer: Self-pay | Admitting: Internal Medicine

## 2022-05-17 NOTE — Telephone Encounter (Signed)
Pt has OV with [facility] on 05/31/2022 at 10am. I received the receipt from the certified letter of his discharge from our practice today. Letter states we can see him up to 30 days from receiving the discharge letter.

## 2022-05-18 NOTE — Telephone Encounter (Signed)
Communications noted. Do we provide him with the letter if he keeps the appointment? Or do we can him one more chance to be compliant?

## 2022-05-30 ENCOUNTER — Ambulatory Visit: Payer: Medicaid Other | Admitting: Neurology

## 2022-05-30 ENCOUNTER — Encounter: Payer: Self-pay | Admitting: Neurology

## 2022-05-30 VITALS — BP 101/68 | HR 110 | Ht 72.0 in | Wt 158.2 lb

## 2022-05-30 DIAGNOSIS — G894 Chronic pain syndrome: Secondary | ICD-10-CM | POA: Diagnosis not present

## 2022-05-30 DIAGNOSIS — R569 Unspecified convulsions: Secondary | ICD-10-CM

## 2022-05-30 MED ORDER — LYRICA 200 MG PO CAPS: ORAL_CAPSULE | ORAL | 5 refills | Status: DC

## 2022-05-30 MED ORDER — VALTOCO 15 MG DOSE 7.5 MG/0.1ML NA LQPK
NASAL | 5 refills | Status: DC
Start: 2022-05-30 — End: 2022-11-01

## 2022-05-30 MED ORDER — FYCOMPA 4 MG PO TABS
ORAL_TABLET | ORAL | 5 refills | Status: DC
Start: 2022-05-30 — End: 2022-11-01
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
## 2022-05-31 ENCOUNTER — Ambulatory Visit: Payer: Medicaid Other | Admitting: Gastroenterology

## 2022-05-31 ENCOUNTER — Ambulatory Visit: Payer: Medicaid Other | Admitting: Neurology

## 2022-05-31 NOTE — Telephone Encounter (Signed)
Patient no showed for his appt today (05/31/22).   Did patient ever get his discharge letter?  If not, we need to get the letter to him by some means?   Can we please not make additional appointments without a provider approval?

## 2022-05-31 NOTE — Telephone Encounter (Signed)
Routing to [place] to be aware not to reschedule another appt.  Can we make note somewhere that would flag [place] not to reschedule or make future appointments?

## 2022-06-02 ENCOUNTER — Other Ambulatory Visit: Payer: Self-pay

## 2022-06-05 ENCOUNTER — Other Ambulatory Visit: Payer: Self-pay

## 2022-06-05 DIAGNOSIS — G894 Chronic pain syndrome: Secondary | ICD-10-CM

## 2022-08-09 ENCOUNTER — Telehealth: Payer: Self-pay | Admitting: Anesthesiology

## 2022-08-09 NOTE — Telephone Encounter (Signed)
Darleen Crocker NP from [facility] in [place] called stating she would like to speak to Dr Delice Lesch about patient. Call back number is [phone].

## 2022-08-09 NOTE — Telephone Encounter (Signed)
Spoke to Darleen Crocker NP, he is a new patient at [facility]. He was in jail for a month and was not on any seizure medication prior to admission, no report of seizure activity. He will come up to the nurses saying he is having a seizure, asking for his medication constantly. He told them he was on Lyrica and Valium, and that staff is concerned about abuse potential. Discussed that I had seen patient one time in December, and that he was specifically asking for Phenobarbital and I had told patient this will not be prescribed for him. I sent in refills for Lyrica 210m TID and Fycompa 441mqhs. Ms. RoMancel Baleeports that his Lyrica was filled in January by his father, however patient was in jail and was not allowed to take any controlled substances, so it is suspicious that this was filled. They have not started any medications, discussed doing an EEG off medications. I also told Ms RoMancel Balehat my plan was for inpatient video EEG monitoring before starting a different ASM, as diagnosis is questionable.

## 2022-08-10 ENCOUNTER — Telehealth: Payer: Self-pay | Admitting: Neurology

## 2022-08-10 NOTE — Telephone Encounter (Signed)
Caller left message with after hour service 08-10-22 at 12:52 pm   Caller states they talked yesterday and regarding a EEG and she would like to know if she wanted a routine a sleep deprived or a ambulatory EEG

## 2022-08-10 NOTE — Telephone Encounter (Signed)
Mrs Julio Sicks spoke with Dr Delice Lesch already closing this phone note

## 2022-08-31 ENCOUNTER — Telehealth: Payer: Self-pay

## 2022-08-31 NOTE — Telephone Encounter (Signed)
Received a call from [facility] they were needing to speak with Dr.Aquino in regards to the patient. Asked for a call back.

## 2022-09-01 NOTE — Telephone Encounter (Signed)
Spoke to [employer] FNP, he keeps reporting he is having seizures, gets on the floor and tells people he is having a seizure. No witnessed seizures. When he came in, psychiatrist put on Ativan because thought he was catatonic (held for 2 doses prior to the ER). He is still focused on Lyrica, Phenobarbital, and Diazepam. He also keeps c/o pain, they have imaged his entire spine which is normal. I let her know that Pain Mgt referral was declined, but if they can find him one, that may help him. I would not recommend seizure medication unless he has documented epileptic seizures (either by EMU or ambulatory EEG). Would not start Pb.

## 2022-09-08 ENCOUNTER — Telehealth (INDEPENDENT_AMBULATORY_CARE_PROVIDER_SITE_OTHER): Payer: Self-pay | Admitting: *Deleted

## 2022-09-08 NOTE — Telephone Encounter (Signed)
Called pt father as he handles all of pt's affairs. Aware appt for next week cancelled since he is dismissed from the practice. He voiced understanding.

## 2022-09-11 ENCOUNTER — Ambulatory Visit: Payer: Medicaid Other | Admitting: Gastroenterology

## 2022-10-03 ENCOUNTER — Ambulatory Visit: Payer: Medicaid Other | Admitting: Neurology

## 2022-10-03 ENCOUNTER — Encounter: Payer: Self-pay | Admitting: Neurology

## 2022-11-18 DEATH — deceased
